# Patient Record
Sex: Female | Born: 1995 | Race: Black or African American | Hispanic: No | Marital: Single | Smoking: Current every day smoker
Health system: Southern US, Community
[De-identification: ages and names within clinical notes are randomized; demographics above are authoritative.]

## PROBLEM LIST (undated history)

## (undated) DIAGNOSIS — R51 Headache: Secondary | ICD-10-CM

## (undated) DIAGNOSIS — R519 Headache, unspecified: Secondary | ICD-10-CM

## (undated) DIAGNOSIS — R011 Cardiac murmur, unspecified: Secondary | ICD-10-CM

## (undated) DIAGNOSIS — J189 Pneumonia, unspecified organism: Secondary | ICD-10-CM

## (undated) DIAGNOSIS — R7303 Prediabetes: Secondary | ICD-10-CM

## (undated) DIAGNOSIS — F419 Anxiety disorder, unspecified: Secondary | ICD-10-CM

## (undated) HISTORY — DX: Anxiety disorder, unspecified: F41.9

---
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
## 2016-01-20 ENCOUNTER — Encounter: Payer: Self-pay | Admitting: Emergency Medicine

## 2016-01-20 DIAGNOSIS — M419 Scoliosis, unspecified: Secondary | ICD-10-CM | POA: Diagnosis not present

## 2016-01-20 DIAGNOSIS — F17210 Nicotine dependence, cigarettes, uncomplicated: Secondary | ICD-10-CM | POA: Insufficient documentation

## 2016-01-20 DIAGNOSIS — R51 Headache: Secondary | ICD-10-CM | POA: Diagnosis not present

## 2016-01-20 DIAGNOSIS — K611 Rectal abscess: Secondary | ICD-10-CM | POA: Diagnosis present

## 2016-01-20 NOTE — ED Triage Notes (Signed)
Patient ambulatory to triage with steady gait, without difficulty or distress noted; pt reports no BM in 3 days, unrelieved by OTC meds & miralax; c/o nausea and generalized abd pain

## 2016-01-21 ENCOUNTER — Emergency Department: Payer: Medicaid Other

## 2016-01-21 ENCOUNTER — Observation Stay: Payer: Medicaid Other | Admitting: Anesthesiology

## 2016-01-21 ENCOUNTER — Observation Stay
Admission: EM | Admit: 2016-01-21 | Discharge: 2016-01-22 | Disposition: A | Discharge status: Home / Self Care | Payer: Medicaid Other | Attending: Surgery | Admitting: Surgery

## 2016-01-21 ENCOUNTER — Encounter: Admission: EM | Disposition: A | Discharge status: Home / Self Care | Payer: Self-pay | Source: Home / Self Care

## 2016-01-21 DIAGNOSIS — K611 Rectal abscess: Secondary | ICD-10-CM | POA: Diagnosis present

## 2016-01-21 HISTORY — PX: INCISION AND DRAINAGE PERIRECTAL ABSCESS: SHX1804

## 2016-01-21 LAB — URINALYSIS COMPLETE WITH MICROSCOPIC (ARMC ONLY)
Bilirubin Urine: NEGATIVE
GLUCOSE, UA: NEGATIVE mg/dL
HGB URINE DIPSTICK: NEGATIVE
Leukocytes, UA: NEGATIVE
NITRITE: NEGATIVE
Protein, ur: 30 mg/dL — AB
Specific Gravity, Urine: 1.03 (ref 1.005–1.030)
pH: 6 (ref 5.0–8.0)

## 2016-01-21 LAB — COMPREHENSIVE METABOLIC PANEL
ALBUMIN: 4 g/dL (ref 3.5–5.0)
ALT: 15 U/L (ref 14–54)
ANION GAP: 7 (ref 5–15)
AST: 20 U/L (ref 15–41)
Alkaline Phosphatase: 68 U/L (ref 38–126)
BUN: 10 mg/dL (ref 6–20)
CHLORIDE: 106 mmol/L (ref 101–111)
CO2: 23 mmol/L (ref 22–32)
Calcium: 9 mg/dL (ref 8.9–10.3)
Creatinine, Ser: 0.87 mg/dL (ref 0.44–1.00)
GFR calc Af Amer: >60 mL/min (ref >60)
GFR calc non Af Amer: >60 mL/min (ref >60)
GLUCOSE: 99 mg/dL (ref 65–99)
POTASSIUM: 3.6 mmol/L (ref 3.5–5.1)
SODIUM: 136 mmol/L (ref 135–145)
TOTAL PROTEIN: 7.9 g/dL (ref 6.5–8.1)
Total Bilirubin: 0.7 mg/dL (ref 0.3–1.2)

## 2016-01-21 LAB — SURGICAL PCR SCREEN
MRSA, PCR: NEGATIVE
Staphylococcus aureus: NEGATIVE

## 2016-01-21 LAB — CBC
HEMATOCRIT: 41.1 % (ref 35.0–47.0)
HEMOGLOBIN: 14.3 g/dL (ref 12.0–16.0)
MCH: 31 pg (ref 26.0–34.0)
MCHC: 34.9 g/dL (ref 32.0–36.0)
MCV: 89 fL (ref 80.0–100.0)
Platelets: 246 10*3/uL (ref 150–440)
RBC: 4.62 MIL/uL (ref 3.80–5.20)
RDW: 13.1 % (ref 11.5–14.5)
WBC: 14.5 10*3/uL — ABNORMAL HIGH (ref 3.6–11.0)

## 2016-01-21 LAB — LIPASE, BLOOD: Lipase: 17 U/L (ref 11–51)

## 2016-01-21 LAB — POC URINE PREG, ED: PREG TEST UR: NEGATIVE

## 2016-01-21 LAB — POCT PREGNANCY, URINE: PREG TEST UR: NEGATIVE

## 2016-01-21 SURGERY — INCISION AND DRAINAGE, ABSCESS, PERIRECTAL
Anesthesia: General

## 2016-01-21 MED ORDER — MORPHINE SULFATE (PF) 2 MG/ML IV SOLN
2.0000 mg | Freq: Once | INTRAVENOUS | Status: AC
  Administered 2016-01-21: 2 mg via INTRAVENOUS

## 2016-01-21 MED ORDER — PIPERACILLIN-TAZOBACTAM 3.375 G IVPB
3.3750 g | Freq: Once | INTRAVENOUS | Status: AC
  Administered 2016-01-21: 3.375 g via INTRAVENOUS
  Filled 2016-01-21: qty 50

## 2016-01-21 MED ORDER — HYDROCODONE-ACETAMINOPHEN 5-325 MG PO TABS
1.0000 | ORAL_TABLET | ORAL | Status: DC | PRN
  Administered 2016-01-22: 1 via ORAL
  Filled 2016-01-21: qty 1

## 2016-01-21 MED ORDER — AMOXICILLIN-POT CLAVULANATE 875-125 MG PO TABS: 1.0000 | ORAL_TABLET | Freq: Two times a day (BID) | ORAL | 1 refills | Status: DC

## 2016-01-21 MED ORDER — ONDANSETRON HCL 4 MG/2ML IJ SOLN
INTRAMUSCULAR | Status: DC | PRN
Start: 2016-01-21 — End: 2016-01-21
  Administered 2016-01-21: 4 mg via INTRAVENOUS

## 2016-01-21 MED ORDER — IOPAMIDOL (ISOVUE-300) INJECTION 61%
100.0000 mL | Freq: Once | INTRAVENOUS | Status: AC | PRN
  Administered 2016-01-21: 100 mL via INTRAVENOUS

## 2016-01-21 MED ORDER — ONDANSETRON HCL 4 MG/2ML IJ SOLN: 4.0000 mg | Freq: Once | INTRAMUSCULAR | Status: DC | PRN

## 2016-01-21 MED ORDER — PIPERACILLIN-TAZOBACTAM 3.375 G IVPB
3.3750 g | Freq: Three times a day (TID) | INTRAVENOUS | Status: DC
  Administered 2016-01-21 – 2016-01-22 (×2): 3.375 g via INTRAVENOUS
  Filled 2016-01-21 (×5): qty 50

## 2016-01-21 MED ORDER — DIATRIZOATE MEGLUMINE & SODIUM 66-10 % PO SOLN
15.0000 mL | Freq: Once | ORAL | Status: AC
  Administered 2016-01-21: 15 mL via ORAL

## 2016-01-21 MED ORDER — FENTANYL CITRATE (PF) 100 MCG/2ML IJ SOLN
25.0000 ug | INTRAMUSCULAR | Status: DC | PRN
  Administered 2016-01-21 (×4): 25 ug via INTRAVENOUS

## 2016-01-21 MED ORDER — ACETAMINOPHEN 325 MG PO TABS
650.0000 mg | ORAL_TABLET | Freq: Once | ORAL | Status: AC
  Administered 2016-01-21: 650 mg via ORAL
  Filled 2016-01-21: qty 2

## 2016-01-21 MED ORDER — MORPHINE SULFATE (PF) 2 MG/ML IV SOLN
2.0000 mg | INTRAVENOUS | Status: DC | PRN
Start: 2016-01-21 — End: 2016-01-22
  Administered 2016-01-21 (×5): 2 mg via INTRAVENOUS
  Filled 2016-01-21 (×5): qty 1

## 2016-01-21 MED ORDER — PROPOFOL 10 MG/ML IV BOLUS
INTRAVENOUS | Status: DC | PRN
  Administered 2016-01-21: 180 mg via INTRAVENOUS

## 2016-01-21 MED ORDER — PIPERACILLIN-TAZOBACTAM 3.375 G IVPB 30 MIN: 3.3750 g | Freq: Three times a day (TID) | INTRAVENOUS | Status: DC

## 2016-01-21 MED ORDER — FENTANYL CITRATE (PF) 100 MCG/2ML IJ SOLN
INTRAMUSCULAR | Status: DC | PRN
  Administered 2016-01-21 (×2): 25 ug via INTRAVENOUS
  Administered 2016-01-21 (×3): 50 ug via INTRAVENOUS

## 2016-01-21 MED ORDER — GLYCOPYRROLATE 0.2 MG/ML IJ SOLN
INTRAMUSCULAR | Status: DC | PRN
  Administered 2016-01-21: 0.2 mg via INTRAVENOUS

## 2016-01-21 MED ORDER — ONDANSETRON HCL 4 MG/2ML IJ SOLN
INTRAMUSCULAR | Status: AC
  Administered 2016-01-21: 4 mg via INTRAVENOUS
  Filled 2016-01-21: qty 2

## 2016-01-21 MED ORDER — HYDROCODONE-ACETAMINOPHEN 5-300 MG PO TABS: 1.0000 | ORAL_TABLET | ORAL | 0 refills | Status: DC | PRN

## 2016-01-21 MED ORDER — MORPHINE SULFATE (PF) 2 MG/ML IV SOLN
INTRAVENOUS | Status: AC
  Administered 2016-01-21: 2 mg via INTRAVENOUS
  Filled 2016-01-21: qty 1

## 2016-01-21 MED ORDER — MIDAZOLAM HCL 2 MG/2ML IJ SOLN
INTRAMUSCULAR | Status: DC | PRN
  Administered 2016-01-21: 2 mg via INTRAVENOUS

## 2016-01-21 MED ORDER — FENTANYL CITRATE (PF) 100 MCG/2ML IJ SOLN
INTRAMUSCULAR | Status: AC
  Filled 2016-01-21: qty 2

## 2016-01-21 MED ORDER — DEXTROSE-NACL 5-0.9 % IV SOLN
INTRAVENOUS | Status: DC
  Administered 2016-01-21 – 2016-01-22 (×3): via INTRAVENOUS

## 2016-01-21 MED ORDER — ONDANSETRON HCL 4 MG/2ML IJ SOLN
4.0000 mg | Freq: Once | INTRAMUSCULAR | Status: AC
  Administered 2016-01-21: 4 mg via INTRAVENOUS

## 2016-01-21 MED ORDER — LACTATED RINGERS IV SOLN
INTRAVENOUS | Status: DC | PRN
  Administered 2016-01-21: 21:00:00 via INTRAVENOUS

## 2016-01-21 MED ORDER — DEXAMETHASONE SODIUM PHOSPHATE 10 MG/ML IJ SOLN
INTRAMUSCULAR | Status: DC | PRN
  Administered 2016-01-21: 8 mg via INTRAVENOUS

## 2016-01-21 MED ORDER — LIDOCAINE HCL (CARDIAC) 20 MG/ML IV SOLN
INTRAVENOUS | Status: DC | PRN
  Administered 2016-01-21: 60 mg via INTRAVENOUS

## 2016-01-21 SURGICAL SUPPLY — 22 items
BLADE CLIPPER SURG (BLADE) ×3 IMPLANT
BLADE SURG 15 STRL LF DISP TIS (BLADE) ×1 IMPLANT
BLADE SURG 15 STRL SS (BLADE) ×2
CANISTER SUCT 1200ML W/VALVE (MISCELLANEOUS) ×3 IMPLANT
DRAPE LEGGINS SURG 28X43 STRL (DRAPES) ×3 IMPLANT
DRAPE UNDER BUTTOCK W/FLU (DRAPES) ×3 IMPLANT
ELECT REM PT RETURN 9FT ADLT (ELECTROSURGICAL)
ELECTRODE REM PT RTRN 9FT ADLT (ELECTROSURGICAL) IMPLANT
GLOVE BIO SURGEON STRL SZ7 (GLOVE) ×9 IMPLANT
GOWN STRL REUS W/ TWL LRG LVL3 (GOWN DISPOSABLE) ×2 IMPLANT
GOWN STRL REUS W/TWL LRG LVL3 (GOWN DISPOSABLE) ×4
JELLY LUB 2OZ STRL (MISCELLANEOUS) ×2
JELLY LUBE 2OZ STRL (MISCELLANEOUS) ×1 IMPLANT
NEEDLE HYPO 22GX1.5 SAFETY (NEEDLE) ×3 IMPLANT
NS IRRIG 1000ML POUR BTL (IV SOLUTION) ×3 IMPLANT
PACK BASIN MINOR ARMC (MISCELLANEOUS) ×3 IMPLANT
PAD PREP 24X41 OB/GYN DISP (PERSONAL CARE ITEMS) ×3 IMPLANT
SCRUB POVIDONE IODINE 4 OZ (MISCELLANEOUS) IMPLANT
SOL PREP PVP 2OZ (MISCELLANEOUS) ×3
SOLUTION PREP PVP 2OZ (MISCELLANEOUS) ×1 IMPLANT
SWAB CULTURE AMIES ANAERIB BLU (MISCELLANEOUS) ×3 IMPLANT
SYR 20CC LL (SYRINGE) ×3 IMPLANT

## 2016-01-21 NOTE — Discharge Instructions (Signed)
Remove dressing in 24 hours. May shower in 24 hours.  Resume all home medications. Take new medications as prescribed Follow-up with Dr. Excell Seltzerooper in 5 days Routine drain care

## 2016-01-21 NOTE — Op Note (Signed)
01/21/2016  9:45 PM  PATIENT:  Carrie [name]  20 y.o. female  PRE-OPERATIVE DIAGNOSIS: Rectal abscess  POST-OPERATIVE DIAGNOSIS:  Same  PROCEDURE: Examination under anesthesia, anoscopy, I&D of perirectal abscess  SURGEON:  Lattie Hawichard E Brianne Maina MD, FACS   ANESTHESIA:  Gen. with endotracheal tube  This a patient with signs of a perirectal abscess requiring incision and drainage preoperatively discussed and reviewed the rationale for surgery the options of observation and the risk of bleeding infection recurrence this is all reviewed for her in the preop holding area she understood and agreed to proceed  Small perirectal abscess in the posterior midline normal anoscopy   Details of Procedure: Patient was placed into general anesthesia and then into high lithotomy position area and a surgical pause was performed and then examination under anesthesia was performed there appeared to be some induration posteriorly but no overt skin changes nor erythema nor drainage. A clamshell anoscope was placed and anoscopy was performed and while there seemed to be some fullness posteriorly there was no drainage and no obvious palpable abscess.  As and prepped draped sterile fashion. An 18-gauge needle with a 10 cc syringe was placed in the area of previous probable CT findings of an abscess or ailments was noted on aspiration and immediately therefore an incision was made and purulence was drained and cultured loculations were broken up with an instrument and then suction was performed to remove all the purulence. A quarter-inch Penrose drain was placed into the small cavity and this was held in with 3-0 nylon.  Patient out of the procedure well the workup occasions a sterile dressing was placed she was taken to recovery room in stable condition to be admitted for continued IV antibiotics.   Lattie Hawichard E Glenette Bookwalter, MD FACS

## 2016-01-21 NOTE — Progress Notes (Signed)
Preoperative Review   Patient is met in the preoperative holding area. The history is reviewed in the chart and with the patient. Patient was seen personally initially in the emergency room and scheduled for today but due to operative room scheduling constraints the patient is to have surgery now rather than earlier today. Her history is thoroughly reviewed. I personally reviewed the options and rationale as well as the risks of this procedure that have been previously discussed with the patient. All questions asked by the patient. Questions were answered to her satisfaction. No family was present.  Patient agrees to proceed with this procedure at this time.  Florene Glen M.D. FACS

## 2016-01-21 NOTE — Anesthesia Preprocedure Evaluation (Signed)
Anesthesia Evaluation  Patient identified by MRN, date of birth, ID band Patient awake    Reviewed: Allergy & Precautions, NPO status , Patient's Chart, lab work & pertinent test results, reviewed documented beta blocker date and time   Airway Mallampati: II  TM Distance: >3 FB     Dental  (+) Chipped   Pulmonary Current Smoker,           Cardiovascular      Neuro/Psych  Headaches,    GI/Hepatic   Endo/Other    Renal/GU      Musculoskeletal   Abdominal   Peds  Hematology   Anesthesia Other Findings   Reproductive/Obstetrics                             Anesthesia Physical Anesthesia Plan  ASA: II  Anesthesia Plan: General   Post-op Pain Management:    Induction: Intravenous  Airway Management Planned: LMA  Additional Equipment:   Intra-op Plan:   Post-operative Plan:   Informed Consent: I have reviewed the patients History and Physical, chart, labs and discussed the procedure including the risks, benefits and alternatives for the proposed anesthesia with the patient or authorized representative who has indicated his/her understanding and acceptance.     Plan Discussed with: CRNA  Anesthesia Plan Comments:         Anesthesia Quick Evaluation

## 2016-01-21 NOTE — Progress Notes (Signed)
Preoperative Informed Consent Review   I have discussed the risks, benefits, and options for this particular procedure with Carrie [name] and family.  She understands the risk for this operation and the alternatives for intervention.  She is in agreement.  She has had her questions answered to her stated satisfaction.  Patient agrees to proceed with this procedure at this time.  [name] Joya Willmott MD FACS

## 2016-01-21 NOTE — H&P (Signed)
Carrie [name] is an 20 y.o. female.    Chief Complaint: Perianal pain  HPI: This patient with 4 days of worsening pain in her perianal area she's never had an episode like this before denies fevers or chills has had normal bowel movements points the area on the posterior midline as a side of her pain. She has no nausea or vomiting  Patient is employed she smokes tobacco does not drink alcohol  Family history noncontributory  Past Medical History:  Diagnosis Date  . Headache     History reviewed. No pertinent surgical history.  No family history on file. Social History:  reports that she has been smoking Cigarettes.  She has never used smokeless tobacco. She reports that she does not drink alcohol or use drugs.  Allergies: No Known Allergies  No prescriptions prior to admission.     Review of Systems  Constitutional: Negative.   HENT: Negative.   Eyes: Negative.   Respiratory: Negative.   Cardiovascular: Negative.   Gastrointestinal: Negative.   Genitourinary: Negative.   Musculoskeletal: Negative.   Skin: Negative.   Neurological: Negative.   Endo/Heme/Allergies: Negative.   Psychiatric/Behavioral: Negative.      Physical Exam:  BP 116/62 (BP Location: Right Arm)   Pulse 63   Temp 98.2 F (36.8 C) (Oral)   Resp 18   Ht '5\' 2"'  (1.575 m)   Wt 136 lb (61.7 kg)   LMP 01/13/2016 (Exact Date) Comment: neg preg test  SpO2 100%   BMI 24.87 kg/m   Physical Exam  Constitutional: She is oriented to person, place, and time and well-developed, well-nourished, and in no distress. No distress.  HENT:  Head: Normocephalic and atraumatic.  Eyes: Right eye exhibits no discharge. Left eye exhibits no discharge. No scleral icterus.  Neck: Normal range of motion.  Cardiovascular: Normal rate, regular rhythm and normal heart sounds.   Pulmonary/Chest: Effort normal and breath sounds normal. No respiratory distress. She has no wheezes. She has no rales.  Abdominal: Soft.  She exhibits no distension. There is no tenderness.  Genitourinary:  Genitourinary Comments: Posterior perianal area tender swollen  Musculoskeletal: Normal range of motion. She exhibits no edema or tenderness.  Lymphadenopathy:    She has no cervical adenopathy.  Neurological: She is alert and oriented to person, place, and time.  Skin: Skin is warm and dry. She is not diaphoretic. No erythema.  Psychiatric: Mood and affect normal.  Vitals reviewed.       Results for orders placed or performed during the hospital encounter of 01/21/16 (from the past 48 hour(s))  Lipase, blood     Status: None   Collection Time: 01/20/16 11:45 PM  Result Value Ref Range   Lipase 17 11 - 51 U/L  Comprehensive metabolic panel     Status: None   Collection Time: 01/20/16 11:45 PM  Result Value Ref Range   Sodium 136 135 - 145 mmol/L   Potassium 3.6 3.5 - 5.1 mmol/L   Chloride 106 101 - 111 mmol/L   CO2 23 22 - 32 mmol/L   Glucose, Bld 99 65 - 99 mg/dL   BUN 10 6 - 20 mg/dL   Creatinine, Ser 0.87 0.44 - 1.00 mg/dL   Calcium 9.0 8.9 - 10.3 mg/dL   Total Protein 7.9 6.5 - 8.1 g/dL   Albumin 4.0 3.5 - 5.0 g/dL   AST 20 15 - 41 U/L   ALT 15 14 - 54 U/L   Alkaline Phosphatase 68 38 -  126 U/L   Total Bilirubin 0.7 0.3 - 1.2 mg/dL   GFR calc non Af Amer >60 >60 mL/min   GFR calc Af Amer >60 >60 mL/min    Comment: (NOTE) The eGFR has been calculated using the CKD EPI equation. This calculation has not been validated in all clinical situations. eGFR's persistently <60 mL/min signify possible Chronic Kidney Disease.    Anion gap 7 5 - 15  CBC     Status: Abnormal   Collection Time: 01/20/16 11:45 PM  Result Value Ref Range   WBC 14.5 (H) 3.6 - 11.0 K/uL   RBC 4.62 3.80 - 5.20 MIL/uL   Hemoglobin 14.3 12.0 - 16.0 g/dL   HCT 41.1 35.0 - 47.0 %   MCV 89.0 80.0 - 100.0 fL   MCH 31.0 26.0 - 34.0 pg   MCHC 34.9 32.0 - 36.0 g/dL   RDW 13.1 11.5 - 14.5 %   Platelets 246 150 - 440 K/uL  Urinalysis  complete, with microscopic     Status: Abnormal   Collection Time: 01/20/16 11:45 PM  Result Value Ref Range   Color, Urine YELLOW (A) YELLOW   APPearance CLEAR (A) CLEAR   Glucose, UA NEGATIVE NEGATIVE mg/dL   Bilirubin Urine NEGATIVE NEGATIVE   Ketones, ur 1+ (A) NEGATIVE mg/dL   Specific Gravity, Urine 1.030 1.005 - 1.030   Hgb urine dipstick NEGATIVE NEGATIVE   pH 6.0 5.0 - 8.0   Protein, ur 30 (A) NEGATIVE mg/dL   Nitrite NEGATIVE NEGATIVE   Leukocytes, UA NEGATIVE NEGATIVE   RBC / HPF 0-5 0 - 5 RBC/hpf   WBC, UA 0-5 0 - 5 WBC/hpf   Bacteria, UA RARE (A) NONE SEEN   Squamous Epithelial / LPF 0-5 (A) NONE SEEN  POC urine preg, ED     Status: None   Collection Time: 01/20/16 11:51 PM  Result Value Ref Range   Preg Test, Ur Negative Negative   Dg Abdomen 1 View  Result Date: 01/21/2016 CLINICAL DATA:  Constipation EXAM: ABDOMEN - 1 VIEW COMPARISON:  None. FINDINGS: Normal bowel gas pattern. No abnormal stool retention or impaction. No concerning mass effect or calcification. Lumbar levo curvature could be positional. No focal osseous finding. IMPRESSION: Normal bowel gas pattern.  No abnormal stool retention. Electronically Signed   By: Monte Fantasia M.D.   On: 01/21/2016 00:56   Ct Abdomen Pelvis W Contrast  Result Date: 01/21/2016 CLINICAL DATA:  Generalized abdominal pain, worse in the right lower quadrant. Nausea. EXAM: CT ABDOMEN AND PELVIS WITH CONTRAST TECHNIQUE: Multidetector CT imaging of the abdomen and pelvis was performed using the standard protocol following bolus administration of intravenous contrast. CONTRAST:  127m ISOVUE-300 IOPAMIDOL (ISOVUE-300) INJECTION 61% COMPARISON:  Radiograph earlier this day. FINDINGS: Lower chest:  The included lung bases are clear. Liver: Normal. Hepatobiliary: Gallbladder physiologically distended, no calcified stone. No biliary dilatation. Pancreas: No ductal dilatation or inflammation. Spleen: Normal. Adrenal glands: No nodule.  Kidneys: Symmetric renal enhancement. No hydronephrosis. No perinephric edema. Stomach/Bowel: Stomach physiologically distended. There are no dilated or thickened small bowel loops. No terminal ileal inflammation. Small to moderate volume of stool throughout the colon without colonic wall thickening. The appendix is normal. There is a midline perirectal fluid collection extending right and left lateral measuring 2.6 x 2.9 x 2.8 cm. Surrounding soft tissue induration with peripheral enhancement and thick wall. Vascular/Lymphatic: No retroperitoneal, mesenteric, or pelvic adenopathy. Abdominal aorta is normal in caliber. Reproductive: Uterus is normal. Ovaries symmetric in size.  No adnexal mass. Bladder: Minimally distended, no wall thickening. Other: No free air free fluid. Musculoskeletal: There are no acute or suspicious osseous abnormalities. Mild broad-based levo scoliosis. IMPRESSION: 1. Midline perirectal fluid collection measuring 2.9 cm most consistent with abscess. 2. No additional acute abnormality.  Normal appendix. Electronically Signed   By: Jeb Levering M.D.   On: 01/21/2016 04:40     Assessment/Plan  This patient with a small posterior midline perirectal abscess that requires I&D she'll be admitted the hospital IV antibiotics will be started and have been instituted in the emergency room. I discussed with her the rationale for incision and drainage the options of observation and the risk of bleeding infection recurrence and open wound she understood and agreed to proceed Dr. Dahlia Byes would be performing this procedure later today  Florene Glen, MD, FACS

## 2016-01-21 NOTE — Anesthesia Procedure Notes (Signed)
Procedure Name: LMA Insertion Performed by: Dijuan Sleeth Pre-anesthesia Checklist: Patient identified, Patient being monitored, Timeout performed, Emergency Drugs available and Suction available Patient Re-evaluated:Patient Re-evaluated prior to inductionOxygen Delivery Method: Circle system utilized Preoxygenation: Pre-oxygenation with 100% oxygen Intubation Type: IV induction Ventilation: Mask ventilation without difficulty LMA: LMA inserted LMA Size: 4.0 Tube type: Oral Number of attempts: 1 Placement Confirmation: positive ETCO2 and breath sounds checked- equal and bilateral Tube secured with: Tape Dental Injury: Teeth and Oropharynx as per pre-operative assessment        

## 2016-01-21 NOTE — Transfer of Care (Signed)
Immediate Anesthesia Transfer of Care Note  Patient: Carrie [name]  Procedure(s) Performed: Procedure(s): IRRIGATION AND DEBRIDEMENT PERIRECTAL ABSCESS (N/A)  Patient Location: PACU  Anesthesia Type:General  Level of Consciousness: sedated  Airway & Oxygen Therapy: Patient Spontanous Breathing and Patient connected to face mask oxygen  Post-op Assessment: Report given to RN and Post -op Vital signs reviewed and stable  Post vital signs: Reviewed and stable  Last Vitals:  Vitals:   01/21/16 1300 01/21/16 2123  BP: 120/64 107/65  Pulse: 67 (!) 106  Resp: 19 (!) 22  Temp: 36.6 C     Last Pain:  Vitals:   01/21/16 2013  TempSrc:   PainSc: 6       Patients Stated Pain Goal: 0 (01/21/16 0900)  Complications: No apparent anesthesia complications

## 2016-01-21 NOTE — ED Notes (Signed)
POCT pregnancy negative

## 2016-01-22 ENCOUNTER — Encounter: Payer: Self-pay | Admitting: Surgery

## 2016-01-22 NOTE — Progress Notes (Signed)
IV was removed. Discharge instructions, follow-up appointments, work letter, and prescriptions were provided to the pt. All questions were answered.

## 2016-01-22 NOTE — Discharge Summary (Signed)
  Patient ID: Irven ShellingChiquita L Radziewicz MRN: [id number] DOB/AGE: 19/11/1995 19 y.o.  Admit date: 01/21/2016 Discharge date: 01/22/2016   Discharge Diagnoses:  Active Problems:   Perirectal abscess   Procedures I&D of perirectal abscess  Hospital Course: 20 year old female admitted for severe anorectal pain consistent with perirectal abscess confirmed by CT scan. Patient was admitted and place an IV antibiotic and was promptly taken to the operating room by Dr. Excell Seltzerooper for an I&D. Did well overnight. At the time of discharge she was ambulating, her pain was controlled, her vital signs were stable and she was afebrile. Her physical exam shows a female in no acute distress awake alert. Abdomen was soft nontender no peritonitis. Extremities were were perfused with no evidence of edema. Condition of the patient at time of discharge is stable    Disposition: 01-Home or Self Care     Medication List    TAKE these medications   amoxicillin-clavulanate 875-125 MG tablet Commonly known as:  AUGMENTIN Take 1 tablet by mouth 2 (two) times daily.   Hydrocodone-Acetaminophen 5-300 MG Tabs Commonly known as:  VICODIN Take 1 tablet by mouth every 4 (four) hours as needed.      Follow-up Information    Dionne Miloichard Cooper, MD Follow up in 5 day(s).   Specialty:  Surgery Contact information: [address] [place] [address] [phone]            Sterling Bigiego Pabon, MD FACS

## 2016-01-22 NOTE — Anesthesia Postprocedure Evaluation (Signed)
Anesthesia Post Note  Patient: Carrie [name]  Procedure(s) Performed: Procedure(s) (LRB): IRRIGATION AND DEBRIDEMENT PERIRECTAL ABSCESS (N/A)  Patient location during evaluation: PACU Anesthesia Type: General Level of consciousness: awake and alert Pain management: pain level controlled Vital Signs Assessment: post-procedure vital signs reviewed and stable Respiratory status: spontaneous breathing, nonlabored ventilation, respiratory function stable and patient connected to nasal cannula oxygen Cardiovascular status: blood pressure returned to baseline and stable Postop Assessment: no signs of nausea or vomiting Anesthetic complications: no    Last Vitals:  Vitals:   01/22/16 0033 01/22/16 0453  BP: 121/87 123/79  Pulse: 88 64  Resp: 20 20  Temp: 36.7 C 36.7 C    Last Pain:  Vitals:   01/22/16 0453  TempSrc: Oral  PainSc:                  [name]

## 2016-01-24 LAB — AEROBIC CULTURE  (SUPERFICIAL SPECIMEN): CULTURE: NO GROWTH

## 2016-01-24 LAB — AEROBIC CULTURE W GRAM STAIN (SUPERFICIAL SPECIMEN)

## 2016-01-26 ENCOUNTER — Ambulatory Visit (INDEPENDENT_AMBULATORY_CARE_PROVIDER_SITE_OTHER): Payer: Medicaid Other | Admitting: Surgery

## 2016-01-26 ENCOUNTER — Encounter: Payer: Self-pay | Admitting: Surgery

## 2016-01-26 ENCOUNTER — Other Ambulatory Visit: Payer: Self-pay

## 2016-01-26 VITALS — BP 125/72 | HR 87 | Temp 98.4°F | Ht 62.0 in | Wt 136.0 lb

## 2016-01-26 DIAGNOSIS — K611 Rectal abscess: Secondary | ICD-10-CM

## 2016-01-26 NOTE — Progress Notes (Signed)
Outpatient postop visit  01/26/2016  Carrie ShellingChiquita L [name] is an 20 y.o. female.    Procedure: I&D of perirectal abscess  CC: No pain and minimal drainage  HPI: Patient feels much better than she did before she still has Penrose drain in place no fevers or chills  Medications reviewed.    Physical Exam:  LMP 01/13/2016 (Exact Date) Comment: neg preg test    PE: Minimal purulent drainage Penrose drain is removed no erythema no tenderness    Assessment/Plan:  Cultures reviewed patient is on antibiotics.  Patient doing better today Penrose drain is removed she'll follow-up in 2 weeks  Lattie Hawichard E Nyanna Heideman, MD, FACS

## 2016-01-26 NOTE — Patient Instructions (Signed)
Please call our office if you have any questions or concerns.  

## 2016-01-27 LAB — ANAEROBIC CULTURE

## 2016-02-21 ENCOUNTER — Encounter: Payer: Self-pay | Admitting: Emergency Medicine

## 2016-02-21 ENCOUNTER — Emergency Department
Admission: EM | Admit: 2016-02-21 | Discharge: 2016-02-21 | Disposition: A | Discharge status: Home / Self Care | Payer: Medicaid Other | Attending: Emergency Medicine | Admitting: Emergency Medicine

## 2016-02-21 DIAGNOSIS — Z791 Long term (current) use of non-steroidal anti-inflammatories (NSAID): Secondary | ICD-10-CM | POA: Insufficient documentation

## 2016-02-21 DIAGNOSIS — J309 Allergic rhinitis, unspecified: Secondary | ICD-10-CM | POA: Insufficient documentation

## 2016-02-21 DIAGNOSIS — F17210 Nicotine dependence, cigarettes, uncomplicated: Secondary | ICD-10-CM | POA: Diagnosis not present

## 2016-02-21 DIAGNOSIS — J029 Acute pharyngitis, unspecified: Secondary | ICD-10-CM | POA: Diagnosis present

## 2016-02-21 LAB — POCT RAPID STREP A: STREPTOCOCCUS, GROUP A SCREEN (DIRECT): NEGATIVE

## 2016-02-21 MED ORDER — LORATADINE 10 MG PO TABS: 10.0000 mg | ORAL_TABLET | Freq: Every day | ORAL | 0 refills | Status: DC

## 2016-02-21 MED ORDER — FLUTICASONE PROPIONATE 50 MCG/ACT NA SUSP
2.0000 | Freq: Every day | NASAL | 0 refills | Status: DC
Start: 2016-02-21 — End: 2019-03-28

## 2016-02-21 NOTE — ED Triage Notes (Signed)
Pt c/o sore throat for the past 3 days. Pain when swallowing. Pt states she also has been getting chills frequently as well.  No changes in voice noticed at this time.

## 2016-02-21 NOTE — ED Provider Notes (Signed)
[facility] Emergency Department Provider Note  ____________________________________________  Time seen: Approximately 10:48 AM  I have reviewed the triage vital signs and the nursing notes.   HISTORY  Chief Complaint Sore Throat    HPI Carrie [name] is a 20 y.o. female, NAD, presents to the emergency department with three-day history of sore throat, nasal congestion. Patient states her throat feels swollen and painful and is worse when she tries to eat. He notices that she swallowing a lot of drainage. Has had no difficulty swallowing or breathing. Has had no swelling about the lips/tongue/throat. Also experiencing clear nasal drainage and clear eye drainage with itchy irritation. Denies fevers, fatigue, chills, cough, chest congestion, chest pain, shortness of breath, wheezing, abdominal pain, nausea or vomiting. Patient has been taking theraflu and halls throat lozenges without relief in symptoms. Patient states her sister had a sore throat a few days ago, but it has resolved. No other sick contacts.    Past Medical History:  Diagnosis Date  . Headache     Patient Active Problem List   Diagnosis Date Noted  . Perirectal abscess     Past Surgical History:  Procedure Laterality Date  . INCISION AND DRAINAGE PERIRECTAL ABSCESS N/A 01/21/2016   Procedure: IRRIGATION AND DEBRIDEMENT PERIRECTAL ABSCESS;  Surgeon: Leafy Ro, MD;  Location: [facility] ORS;  Service: General;  Laterality: N/A;    Prior to Admission medications   Medication Sig Start Date End Date Taking? Authorizing Provider  amoxicillin-clavulanate (AUGMENTIN) 875-125 MG tablet Take 1 tablet by mouth 2 (two) times daily. 01/21/16   Lattie Haw, MD  fluticasone (FLONASE) 50 MCG/ACT nasal spray Place 2 sprays into both nostrils daily. 02/21/16   Jami L Hagler, PA-C  Hydrocodone-Acetaminophen (VICODIN) 5-300 MG TABS Take 1 tablet by mouth every 4 (four) hours as needed. 01/21/16   Lattie Haw, MD  loratadine (CLARITIN) 10 MG tablet Take 1 tablet (10 mg total) by mouth daily. 02/21/16   Jami L Hagler, PA-C  polyethylene glycol powder (GLYCOLAX/MIRALAX) powder MIX 2 CAPFULS IN 8 OUNCES OF WATER AND DRINK ON DAY 1 AND DAY2 THEN TAKE AS NEEDED 01/19/16   Historical Provider, MD    Allergies Review of patient's allergies indicates no known allergies.  Family History  Problem Relation Age of Onset  . Hypertension Mother   . Cancer Maternal Aunt   . Heart disease Paternal Grandmother     Social History Social History  Substance Use Topics  . Smoking status: Current Every Day Smoker    Packs/day: 0.25    Types: Cigarettes  . Smokeless tobacco: Never Used  . Alcohol use No     Review of Systems  Constitutional: No fever/chills Eyes: No visual changes. Clear discharge bilaterally with itchy irritation.  ENT: Positive sore throat, nasal congestion, runny nose, postnasal drip. No ear pain, sinus pressure. Cardiovascular: No chest pain. Respiratory: No cough. No shortness of breath. No wheezing.  Gastrointestinal: No abdominal pain.  No nausea, vomiting.   Musculoskeletal:  Negative for general myalgias Skin: Negative for rash. Neurological: Negative for headaches.   ____________________________________________   PHYSICAL EXAM:  VITAL SIGNS: ED Triage Vitals [02/21/16 1043]  Enc Vitals Group     BP 131/76     Pulse Rate 84     Resp 18     Temp 98.5 F (36.9 C)     Temp Source Oral     SpO2 100 %     Weight 136 lb (61.7 kg)  Height 5\' 2"  (1.575 m)     Head Circumference      Peak Flow      Pain Score      Pain Loc      Pain Edu?      Excl. in GC?     Constitutional: Alert and oriented. Well appearing and in no acute distress. Eyes: Conjunctivae are normal bilaterally, no drainage. PERRLA. EOMI without pain Head: Atraumatic. ENT:      Ears: TMs visualized bilaterally without erythema, bulging, effusion, perforation.      Nose: Moderate  congestion with moderate clear rhinorrhea. Bilateral turbinates with injection and mild edema.      Mouth/Throat: Mucous membranes are moist. Pharynx with mild injection and clear postnasal drainage but no swelling or exudate.  Neck: No stridor. Supple with full ROM. Hematological/Lymphatic/Immunilogical: No cervical lymphadenopathy. Cardiovascular: Normal rate, regular rhythm. Normal S1 and S2.  Good peripheral circulation. Respiratory: Normal respiratory effort without tachypnea or retractions. Lungs CTAB with breath sounds noted in all lung fields. No wheeze, rhonchi, rales. Musculoskeletal: Full ROM in all extremities without pain or difficulty. Neurologic:  Normal speech and language. No gross focal neurologic deficits are appreciated.  Skin:  Skin is warm, dry and intact. No rash noted. Psychiatric: Mood and affect are normal. Speech and behavior are normal. Patient exhibits appropriate insight and judgement.   ____________________________________________   LABS (all labs ordered are listed, but only abnormal results are displayed)  Labs Reviewed  POCT RAPID STREP A   ____________________________________________  EKG  None ____________________________________________  RADIOLOGY  None ____________________________________________    PROCEDURES  Procedure(s) performed: None   Procedures   Medications - No data to display   ____________________________________________   INITIAL IMPRESSION / ASSESSMENT AND PLAN / ED COURSE  Pertinent labs & imaging results that were available during my care of the patient were reviewed by me and considered in my medical decision making (see chart for details).  Clinical Course    Patient's diagnosis is consistent with Allergic rhinitis. Patient will be discharged home with prescriptions for Flonase and loratadine to take as directed. Patient is to follow up with her primary care provider if symptoms persist past this treatment  course. Patient is given ED precautions to return to the ED for any worsening or new symptoms.    ____________________________________________  FINAL CLINICAL IMPRESSION(S) / ED DIAGNOSES  Final diagnoses:  Allergic rhinitis, unspecified allergic rhinitis type      NEW MEDICATIONS STARTED DURING THIS VISIT:  New Prescriptions   FLUTICASONE (FLONASE) 50 MCG/ACT NASAL SPRAY    Place 2 sprays into both nostrils daily.   LORATADINE (CLARITIN) 10 MG TABLET    Take 1 tablet (10 mg total) by mouth daily.        Hope PigeonJami L Hagler, PA-C 02/21/16 1128    Governor Rooksebecca Lord, MD 02/21/16 1217

## 2017-05-14 ENCOUNTER — Emergency Department
Admission: EM | Admit: 2017-05-14 | Discharge: 2017-05-14 | Disposition: A | Discharge status: Home / Self Care | Payer: Self-pay | Attending: Emergency Medicine | Admitting: Emergency Medicine

## 2017-05-14 ENCOUNTER — Encounter: Payer: Self-pay | Admitting: Emergency Medicine

## 2017-05-14 ENCOUNTER — Other Ambulatory Visit: Payer: Self-pay

## 2017-05-14 DIAGNOSIS — F17210 Nicotine dependence, cigarettes, uncomplicated: Secondary | ICD-10-CM | POA: Insufficient documentation

## 2017-05-14 DIAGNOSIS — M545 Low back pain, unspecified: Secondary | ICD-10-CM

## 2017-05-14 LAB — URINALYSIS, COMPLETE (UACMP) WITH MICROSCOPIC
BILIRUBIN URINE: NEGATIVE
Bacteria, UA: NONE SEEN
Glucose, UA: NEGATIVE mg/dL
HGB URINE DIPSTICK: NEGATIVE
KETONES UR: NEGATIVE mg/dL
Leukocytes, UA: NEGATIVE
Nitrite: NEGATIVE
PH: 7 (ref 5.0–8.0)
Protein, ur: NEGATIVE mg/dL
SPECIFIC GRAVITY, URINE: 1.02 (ref 1.005–1.030)

## 2017-05-14 LAB — BASIC METABOLIC PANEL
Anion gap: 8 (ref 5–15)
BUN: 12 mg/dL (ref 6–20)
CALCIUM: 9.2 mg/dL (ref 8.9–10.3)
CHLORIDE: 106 mmol/L (ref 101–111)
CO2: 26 mmol/L (ref 22–32)
CREATININE: 0.79 mg/dL (ref 0.44–1.00)
GFR calc non Af Amer: >60 mL/min (ref >60)
Glucose, Bld: 107 mg/dL — ABNORMAL HIGH (ref 65–99)
Potassium: 3.5 mmol/L (ref 3.5–5.1)
Sodium: 140 mmol/L (ref 135–145)

## 2017-05-14 LAB — CBC
HCT: 40 % (ref 35.0–47.0)
Hemoglobin: 13.5 g/dL (ref 12.0–16.0)
MCH: 31 pg (ref 26.0–34.0)
MCHC: 33.7 g/dL (ref 32.0–36.0)
MCV: 92 fL (ref 80.0–100.0)
PLATELETS: 260 10*3/uL (ref 150–440)
RBC: 4.35 MIL/uL (ref 3.80–5.20)
RDW: 13.5 % (ref 11.5–14.5)
WBC: 6.5 10*3/uL (ref 3.6–11.0)

## 2017-05-14 LAB — POCT PREGNANCY, URINE: PREG TEST UR: NEGATIVE

## 2017-05-14 MED ORDER — NAPROXEN 375 MG PO TABS: 375.0000 mg | ORAL_TABLET | Freq: Two times a day (BID) | ORAL | 0 refills | Status: AC

## 2017-05-14 NOTE — ED Provider Notes (Signed)
[facility] Emergency Department Provider Note  ____________________________________________   I have reviewed the triage vital signs and the nursing notes. Where available I have reviewed prior notes and, if possible and indicated, outside hospital notes.    HISTORY  Chief Complaint Flank Pain and Fatigue    HPI Sharman L Gaillard is a 21 y.o. female presents today complaining of back pain.  Patient has had back pain for 5 or 6 years.  It is always in the same spot, in the upper lumbar region.  Patient does bend over a lot to work and it makes it worse.  She has had no incontinence of bowel or bladder, no fever no chills, she states sometimes she has dysuria did have one episode of dysuria this morning but no frequency no other signs of urinary tract infection does not believe she has a UTI "I am sure of it".  Patient states she has had no vaginal discharge or abdominal pain.  She has had no focal numbness or weakness, she denies any difficulty bending over, the patient states the pain is worse when she moves or changes position the wrong way, it is better when she sits still, also worse when she twists.  There is no rectal pain or pain with defecation.  The pain is sharp.  It has been there for years but worse over the last couple days.  Patient would like a work note for both her jobs.  She has taken nothing for the pain at home including nonsteroidals.    Past Medical History:  Diagnosis Date  . Headache     Patient Active Problem List   Diagnosis Date Noted  . Perirectal abscess     Past Surgical History:  Procedure Laterality Date  . INCISION AND DRAINAGE PERIRECTAL ABSCESS N/A 01/21/2016   Procedure: IRRIGATION AND DEBRIDEMENT PERIRECTAL ABSCESS;  Surgeon: Leafy Ro, MD;  Location: [facility] ORS;  Service: General;  Laterality: N/A;    Prior to Admission medications   Medication Sig Start Date End Date Taking? Authorizing Provider   amoxicillin-clavulanate (AUGMENTIN) 875-125 MG tablet Take 1 tablet by mouth 2 (two) times daily. 01/21/16   Lattie Haw, MD  fluticasone (FLONASE) 50 MCG/ACT nasal spray Place 2 sprays into both nostrils daily. 02/21/16   Hagler, Jami L, PA-C  Hydrocodone-Acetaminophen (VICODIN) 5-300 MG TABS Take 1 tablet by mouth every 4 (four) hours as needed. 01/21/16   Lattie Haw, MD  loratadine (CLARITIN) 10 MG tablet Take 1 tablet (10 mg total) by mouth daily. 02/21/16   Hagler, Jami L, PA-C  polyethylene glycol powder (GLYCOLAX/MIRALAX) powder MIX 2 CAPFULS IN 8 OUNCES OF WATER AND DRINK ON DAY 1 AND DAY2 THEN TAKE AS NEEDED 01/19/16   [provider]    Allergies Patient has no known allergies.  Family History  Problem Relation Age of Onset  . Hypertension Mother   . Cancer Maternal Aunt   . Heart disease Paternal Grandmother     Social History Social History   Tobacco Use  . Smoking status: Current Every Day Smoker    Packs/day: 0.25    Types: Cigarettes  . Smokeless tobacco: Never Used  Substance Use Topics  . Alcohol use: No  . Drug use: No    Review of Systems Constitutional: No fever/chills Eyes: No visual changes. ENT: No sore throat. No stiff neck no neck pain Cardiovascular: Denies chest pain. Respiratory: Denies shortness of breath. Gastrointestinal:   no vomiting.  No diarrhea.  No constipation. Genitourinary: Negative for dysuria. Musculoskeletal: Negative lower extremity swelling Skin: Negative for rash. Neurological: Negative for severe headaches, focal weakness or numbness.   ____________________________________________   PHYSICAL EXAM:  VITAL SIGNS: ED Triage Vitals  Enc Vitals Group     BP 05/14/17 1248 97/72     Pulse Rate 05/14/17 1248 73     Resp 05/14/17 1248 18     Temp 05/14/17 1248 98.2 F (36.8 C)     Temp Source 05/14/17 1248 Oral     SpO2 05/14/17 1248 100 %     Weight 05/14/17 1248 153 lb (69.4 kg)     Height 05/14/17  1248 5\' 2"  (1.575 m)     Head Circumference --      Peak Flow --      Pain Score 05/14/17 1251 7     Pain Loc --      Pain Edu? --      Excl. in GC? --     Constitutional: Alert and oriented. Well appearing and in no acute distress. Eyes: Conjunctivae are normal Head: Atraumatic HEENT: No congestion/rhinnorhea. Mucous membranes are moist.  Oropharynx non-erythematous Neck:   Nontender with no meningismus, no masses, no stridor Cardiovascular: Normal rate, regular rhythm. Grossly normal heart sounds.  Good peripheral circulation. Respiratory: Normal respiratory effort.  No retractions. Lungs CTAB. Abdominal: Soft and nontender. No distention. No guarding no rebound Back: There is non-midline tenderness to palpation of the paraspinal muscles but no evidence of abscess shingles erythema or infection, there is no midline tenderness there are no lesions noted. there is no CVA tenderness Musculoskeletal: No lower extremity tenderness, no upper extremity tenderness. No joint effusions, no DVT signs strong distal pulses no edema Neurologic:  Normal speech and language. No gross focal neurologic deficits are appreciated.  Skin:  Skin is warm, dry and intact. No rash noted. Psychiatric: Mood and affect are normal. Speech and behavior are normal.  ____________________________________________   LABS (all labs ordered are listed, but only abnormal results are displayed)  Labs Reviewed  URINALYSIS, COMPLETE (UACMP) WITH MICROSCOPIC - Abnormal; Notable for the following components:      Result Value   Color, Urine YELLOW (*)    APPearance HAZY (*)    Squamous Epithelial / LPF 6-30 (*)    All other components within normal limits  BASIC METABOLIC PANEL - Abnormal; Notable for the following components:   Glucose, Bld 107 (*)    All other components within normal limits  URINE CULTURE  CBC  POC URINE PREG, ED  POCT PREGNANCY, URINE    Pertinent labs  results that were available during my  care of the patient were reviewed by me and considered in my medical decision making (see chart for details). ____________________________________________  EKG  I personally interpreted any EKGs ordered by me or triage  ____________________________________________  RADIOLOGY  Pertinent labs & imaging results that were available during my care of the patient were reviewed by me and considered in my medical decision making (see chart for details). If possible, patient and/or family made aware of any abnormal findings.  No results found. ____________________________________________    PROCEDURES  Procedure(s) performed: None  Procedures  Critical Care performed: None  ____________________________________________   INITIAL IMPRESSION / ASSESSMENT AND PLAN / ED COURSE  Pertinent labs & imaging results that were available during my care of the patient were reviewed by me and considered in my medical decision making (see chart for details).  Patient here with chronic  recurrent low back pain with no radiation or radicular symptoms no evidence of cauda equina syndrome no evidence of abscess or paraspinal hematoma no evidence of referred abdominal discomfort no evidence of UTI or kidney stone disease no evidence of PID or appendicitis or other intra-abdominal pathology and patient declines pelvic exam which I think at this time is quite reasonable given her symptoms.  We will start patient on nonsteroidal pain medications extensive return precautions given and understood.  We will give her her requested no work notes    ____________________________________________   FINAL CLINICAL IMPRESSION(S) / ED DIAGNOSES  Final diagnoses:  None      This chart was dictated using voice recognition software.  Despite best efforts to proofread,  errors can occur which can change meaning.      Jeanmarie PlantMcShane, James A, MD 05/14/17 1500

## 2017-05-14 NOTE — ED Triage Notes (Signed)
Pt presents to ED via POV c/o R flank pain, dysuria, and generalized fatigue x2 weeks. C/o nausea/vomiting when waking up in the morning.

## 2017-05-14 NOTE — ED Notes (Signed)
NAD noted at time of D/C. Pt denies questions or concerns. Pt ambulatory to the lobby at this time.  

## 2017-05-15 LAB — URINE CULTURE

## 2017-07-15 ENCOUNTER — Other Ambulatory Visit: Payer: Self-pay

## 2017-07-15 ENCOUNTER — Encounter: Payer: Self-pay | Admitting: Emergency Medicine

## 2017-07-15 ENCOUNTER — Emergency Department
Admission: EM | Admit: 2017-07-15 | Discharge: 2017-07-15 | Disposition: A | Discharge status: Home / Self Care | Payer: Medicaid Other | Attending: Emergency Medicine | Admitting: Emergency Medicine

## 2017-07-15 DIAGNOSIS — Z79899 Other long term (current) drug therapy: Secondary | ICD-10-CM | POA: Insufficient documentation

## 2017-07-15 DIAGNOSIS — J101 Influenza due to other identified influenza virus with other respiratory manifestations: Secondary | ICD-10-CM | POA: Insufficient documentation

## 2017-07-15 DIAGNOSIS — F17210 Nicotine dependence, cigarettes, uncomplicated: Secondary | ICD-10-CM | POA: Insufficient documentation

## 2017-07-15 LAB — INFLUENZA PANEL BY PCR (TYPE A & B)
Influenza A By PCR: POSITIVE — AB
Influenza B By PCR: NEGATIVE

## 2017-07-15 MED ORDER — OSELTAMIVIR PHOSPHATE 75 MG PO CAPS: 75.0000 mg | ORAL_CAPSULE | Freq: Two times a day (BID) | ORAL | 0 refills | Status: DC

## 2017-07-15 NOTE — ED Notes (Signed)
Pt reports that the cough started yesterday and that she is positive for fever.  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

## 2017-07-15 NOTE — ED Provider Notes (Signed)
[facility] Emergency Department Provider Note  ____________________________________________   First MD Initiated Contact with Patient 07/15/17 1109     (approximate)  I have reviewed the triage vital signs and the nursing notes.   HISTORY  Chief Complaint Cough    HPI Carrie [name] is a 22 y.o. female who presents to the emergency department complaining of fever, chills, and cough.  Both started yesterday.  She is concerned because she works in a [employer].  She works at [employer].  She denies any chest pain, shortness of breath, vomiting or diarrhea.   Past Medical History:  Diagnosis Date  . Headache     Patient Active Problem List   Diagnosis Date Noted  . Perirectal abscess     Past Surgical History:  Procedure Laterality Date  . INCISION AND DRAINAGE PERIRECTAL ABSCESS N/A 01/21/2016   Procedure: IRRIGATION AND DEBRIDEMENT PERIRECTAL ABSCESS;  Surgeon: Leafy Ro, MD;  Location: [facility] ORS;  Service: General;  Laterality: N/A;    Prior to Admission medications   Medication Sig Start Date End Date Taking? Authorizing Provider  amoxicillin-clavulanate (AUGMENTIN) 875-125 MG tablet Take 1 tablet by mouth 2 (two) times daily. 01/21/16   Lattie Haw, MD  fluticasone (FLONASE) 50 MCG/ACT nasal spray Place 2 sprays into both nostrils daily. 02/21/16   Hagler, Jami L, PA-C  Hydrocodone-Acetaminophen (VICODIN) 5-300 MG TABS Take 1 tablet by mouth every 4 (four) hours as needed. 01/21/16   Lattie Haw, MD  loratadine (CLARITIN) 10 MG tablet Take 1 tablet (10 mg total) by mouth daily. 02/21/16   Hagler, Jami L, PA-C  naproxen (NAPROSYN) 375 MG tablet Take 1 tablet (375 mg total) by mouth 2 (two) times daily with a meal. 05/14/17 05/14/18  Jeanmarie Plant, MD  oseltamivir (TAMIFLU) 75 MG capsule Take 1 capsule (75 mg total) by mouth 2 (two) times daily. 07/15/17   Sonam Wandel, Roselyn Bering, PA-C  polyethylene glycol powder  (GLYCOLAX/MIRALAX) powder MIX 2 CAPFULS IN 8 OUNCES OF WATER AND DRINK ON DAY 1 AND DAY2 THEN TAKE AS NEEDED 01/19/16   [provider]    Allergies Patient has no known allergies.  Family History  Problem Relation Age of Onset  . Hypertension Mother   . Cancer Maternal Aunt   . Heart disease Paternal Grandmother     Social History Social History   Tobacco Use  . Smoking status: Current Every Day Smoker    Packs/day: 0.25    Types: Cigarettes  . Smokeless tobacco: Never Used  Substance Use Topics  . Alcohol use: No  . Drug use: No    Review of Systems  Constitutional: Positive fever/chills Eyes: No visual changes. ENT: No sore throat. Respiratory: Positive cough Genitourinary: Negative for dysuria. Musculoskeletal: Negative for back pain. Skin: Negative for rash.    ____________________________________________   PHYSICAL EXAM:  VITAL SIGNS: ED Triage Vitals  Enc Vitals Group     BP 07/15/17 1015 (!) 132/96     Pulse Rate 07/15/17 1015 75     Resp 07/15/17 1015 20     Temp 07/15/17 1015 98.1 F (36.7 C)     Temp Source 07/15/17 1015 Oral     SpO2 07/15/17 1015 100 %     Weight 07/15/17 1017 143 lb (64.9 kg)     Height 07/15/17 1017 5\' 2"  (1.575 m)     Head Circumference --      Peak Flow --  Pain Score 07/15/17 1017 10     Pain Loc --      Pain Edu? --      Excl. in GC? --     Constitutional: Alert and oriented. Well appearing and in no acute distress.  Patient is febrile Eyes: Conjunctivae are normal.  Head: Atraumatic. Nose: No congestion/rhinnorhea. Mouth/Throat: Mucous membranes are moist.  It is irritated Neck: Is supple, no lymphadenopathy is noted Cardiovascular: Normal rate, regular rhythm.  Heart sounds are normal Respiratory: Normal respiratory effort.  No retractions, lungs clear to auscultation GU: deferred Musculoskeletal: FROM all extremities, warm and well perfused Neurologic:  Normal speech and language.  Skin:  Skin  is warm, dry and intact. No rash noted. Psychiatric: Mood and affect are normal. Speech and behavior are normal.  ____________________________________________   LABS (all labs ordered are listed, but only abnormal results are displayed)  Labs Reviewed  INFLUENZA PANEL BY PCR (TYPE A & B) - Abnormal; Notable for the following components:      Result Value   Influenza A By PCR POSITIVE (*)    All other components within normal limits   ____________________________________________   ____________________________________________  RADIOLOGY    ____________________________________________   PROCEDURES  Procedure(s) performed: No  Procedures    ____________________________________________   INITIAL IMPRESSION / ASSESSMENT AND PLAN / ED COURSE  Pertinent labs & imaging results that were available during my care of the patient were reviewed by me and considered in my medical decision making (see chart for details).  Patient is 22 year old female reporting to the emergency department complaining of fever, chills, and cough.  Physical exam she appears to be febrile and has a dry cough.  Influenza test is positive for influenza A  Test results were discussed with patient.  Told her I have concerns of her returning to work at this time.  She agrees.  She was given a work note stating that she should not return to work until she has not had a fever for 24-48 hours.  She was given a prescription for Tamiflu 75 mg twice daily for 5 days.  She is to drink fluids, take over-the-counter cold medicines as needed.  If she is worsening she should return to the emergency department.  Patient states she understands and will comply with our recommendations.  She was discharged in stable condition      As part of my medical decision making, I reviewed the following data within the electronic MEDICAL RECORD NUMBER Nursing notes reviewed and incorporated, Labs reviewed influenza was positive for  influenza A, Old chart reviewed, Notes from prior ED visits and [place] Controlled Substance Database  ____________________________________________   FINAL CLINICAL IMPRESSION(S) / ED DIAGNOSES  Final diagnoses:  Influenza A      NEW MEDICATIONS STARTED DURING THIS VISIT:  Discharge Medication List as of 07/15/2017 11:14 AM    START taking these medications   Details  oseltamivir (TAMIFLU) 75 MG capsule Take 1 capsule (75 mg total) by mouth 2 (two) times daily., Starting Sat 07/15/2017, Print         Note:  This document was prepared using Dragon voice recognition software and may include unintentional dictation errors.    Faythe GheeFisher, Karlina Suares W, PA-C 07/15/17 1447    Governor RooksLord, Rebecca, MD 07/15/17 [phone]

## 2017-07-15 NOTE — ED Notes (Signed)

## 2017-07-15 NOTE — ED Triage Notes (Signed)
Cough and body aches since yesterday.

## 2017-07-15 NOTE — Discharge Instructions (Signed)
Use medication as prescribed.  Take Tylenol and ibuprofen for fever as needed.  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  May use over-the-counter cough medicine as needed.  Drink plenty of fluids

## 2017-09-20 ENCOUNTER — Emergency Department
Admission: EM | Admit: 2017-09-20 | Discharge: 2017-09-20 | Discharge status: Against Medical Advice | Payer: Medicaid Other | Attending: Emergency Medicine | Admitting: Emergency Medicine

## 2017-09-20 ENCOUNTER — Other Ambulatory Visit: Payer: Self-pay

## 2017-09-20 DIAGNOSIS — J029 Acute pharyngitis, unspecified: Secondary | ICD-10-CM | POA: Insufficient documentation

## 2017-09-20 DIAGNOSIS — Z5321 Procedure and treatment not carried out due to patient leaving prior to being seen by health care provider: Secondary | ICD-10-CM | POA: Insufficient documentation

## 2017-09-20 NOTE — ED Triage Notes (Signed)
Pt c/o sore throat for the past 2 weeks. Denies fevers.

## 2017-09-20 NOTE — ED Notes (Signed)
See triage note   States she developed a sore throat about 2 weeks ago.. No fever  States she has tried OTC meds  W/o relief   Afebrile on arrival

## 2017-09-20 NOTE — ED Notes (Signed)
Pt not in room for provider   

## 2017-10-03 ENCOUNTER — Encounter: Payer: Self-pay | Admitting: *Deleted

## 2017-10-03 ENCOUNTER — Other Ambulatory Visit: Payer: Self-pay

## 2017-10-03 ENCOUNTER — Emergency Department
Admission: EM | Admit: 2017-10-03 | Discharge: 2017-10-03 | Disposition: A | Discharge status: Home / Self Care | Payer: Medicaid Other | Attending: Emergency Medicine | Admitting: Emergency Medicine

## 2017-10-03 DIAGNOSIS — J029 Acute pharyngitis, unspecified: Secondary | ICD-10-CM | POA: Insufficient documentation

## 2017-10-03 DIAGNOSIS — Z79899 Other long term (current) drug therapy: Secondary | ICD-10-CM | POA: Insufficient documentation

## 2017-10-03 DIAGNOSIS — F17210 Nicotine dependence, cigarettes, uncomplicated: Secondary | ICD-10-CM | POA: Insufficient documentation

## 2017-10-03 LAB — GROUP A STREP BY PCR: Group A Strep by PCR: NOT DETECTED

## 2017-10-03 LAB — MONONUCLEOSIS SCREEN: Mono Screen: NEGATIVE

## 2017-10-03 MED ORDER — DEXAMETHASONE SODIUM PHOSPHATE 10 MG/ML IJ SOLN
INTRAMUSCULAR | Status: AC
  Administered 2017-10-03: 10 mg via ORAL
  Filled 2017-10-03: qty 1

## 2017-10-03 MED ORDER — LIDOCAINE VISCOUS 2 % MT SOLN
15.0000 mL | Freq: Once | OROMUCOSAL | Status: AC
  Administered 2017-10-03: 15 mL via OROMUCOSAL
  Filled 2017-10-03: qty 15

## 2017-10-03 MED ORDER — LIDOCAINE VISCOUS 2 % MT SOLN: 20.0000 mL | OROMUCOSAL | 0 refills | Status: DC | PRN

## 2017-10-03 MED ORDER — DEXAMETHASONE 1 MG/ML PO CONC
10.0000 mg | Freq: Once | ORAL | Status: DC
  Filled 2017-10-03: qty 10

## 2017-10-03 NOTE — Discharge Instructions (Addendum)
Please follow up with the acute care clinic for further evaluation of your sore throat

## 2017-10-03 NOTE — ED Triage Notes (Signed)
Throat congestion for about 2 weeks, associated with nasal congestion. No fevers, no meds PTA

## 2017-10-03 NOTE — ED Provider Notes (Signed)
[facility] Emergency Department Provider Note   ____________________________________________   First MD Initiated Contact with Patient 10/03/17 [phone]     (approximate)  I have reviewed the triage vital signs and the nursing notes.   HISTORY  Chief Complaint Sore Throat    HPI Carrie [name] is a 22 y.o. female who comes into the hospital today with a sore throat for the past 2 weeks.  The patient states that she was reading about her symptoms on the Internet and became concerned.  She was concerned that maybe she had some problem with her tonsils or throat cancer.  The patient thought maybe she had allergies initially so she has been using nasal spray and Benadryl as well as cough drops.  The patient rates her pain a 6 out of 10 in intensity currently.  The patient denies any sick contacts.  She recently had the flu about 3 weeks ago.  She denies any cough or runny nose.  She is here today for evaluation.   Past Medical History:  Diagnosis Date  . Headache     Patient Active Problem List   Diagnosis Date Noted  . Perirectal abscess     Past Surgical History:  Procedure Laterality Date  . INCISION AND DRAINAGE PERIRECTAL ABSCESS N/A 01/21/2016   Procedure: IRRIGATION AND DEBRIDEMENT PERIRECTAL ABSCESS;  Surgeon: Leafy Ro, MD;  Location: [facility] ORS;  Service: General;  Laterality: N/A;    Prior to Admission medications   Medication Sig Start Date End Date Taking? Authorizing Provider  amoxicillin-clavulanate (AUGMENTIN) 875-125 MG tablet Take 1 tablet by mouth 2 (two) times daily. 01/21/16   Lattie Haw, MD  fluticasone (FLONASE) 50 MCG/ACT nasal spray Place 2 sprays into both nostrils daily. 02/21/16   Hagler, Jami L, PA-C  Hydrocodone-Acetaminophen (VICODIN) 5-300 MG TABS Take 1 tablet by mouth every 4 (four) hours as needed. 01/21/16   Lattie Haw, MD  lidocaine (XYLOCAINE) 2 % solution Use as directed 20 mLs in the mouth or throat as  needed for mouth pain. 10/03/17   Rebecka Apley, MD  loratadine (CLARITIN) 10 MG tablet Take 1 tablet (10 mg total) by mouth daily. 02/21/16   Hagler, Jami L, PA-C  naproxen (NAPROSYN) 375 MG tablet Take 1 tablet (375 mg total) by mouth 2 (two) times daily with a meal. 05/14/17 05/14/18  Jeanmarie Plant, MD  oseltamivir (TAMIFLU) 75 MG capsule Take 1 capsule (75 mg total) by mouth 2 (two) times daily. 07/15/17   Fisher, Roselyn Bering, PA-C  polyethylene glycol powder (GLYCOLAX/MIRALAX) powder MIX 2 CAPFULS IN 8 OUNCES OF WATER AND DRINK ON DAY 1 AND DAY2 THEN TAKE AS NEEDED 01/19/16   [provider]    Allergies Patient has no known allergies.  Family History  Problem Relation Age of Onset  . Hypertension Mother   . Cancer Maternal Aunt   . Heart disease Paternal Grandmother     Social History Social History   Tobacco Use  . Smoking status: Current Every Day Smoker    Packs/day: 0.25    Types: Cigarettes  . Smokeless tobacco: Never Used  Substance Use Topics  . Alcohol use: No  . Drug use: No    Review of Systems  Constitutional: No fever/chills Eyes: No visual changes. ENT:  sore throat. Cardiovascular: Denies chest pain. Respiratory: Denies shortness of breath. Gastrointestinal: No abdominal pain.  No nausea, no vomiting.  No diarrhea.  No constipation. Genitourinary: Negative for dysuria. Musculoskeletal:  Negative for back pain. Skin: Negative for rash. Neurological: Negative for headaches, focal weakness or numbness.   ____________________________________________   PHYSICAL EXAM:  VITAL SIGNS: ED Triage Vitals [10/03/17 0056]  Enc Vitals Group     BP (!) 128/51     Pulse Rate 86     Resp 16     Temp 98.5 F (36.9 C)     Temp Source Oral     SpO2 99 %     Weight      Height      Head Circumference      Peak Flow      Pain Score 5     Pain Loc      Pain Edu?      Excl. in GC?     Constitutional: Alert and oriented. Well appearing and in no  acute distress. Eyes: Conjunctivae are normal. PERRL. EOMI. Head: Atraumatic. Nose: No congestion/rhinnorhea. Mouth/Throat: Mucous membranes are moist.  Oropharynx non-erythematous. Neck: No stridor.   Hematological/Lymphatic/Immunilogical: Anterior cervical lymphadenopathy. Cardiovascular: Normal rate, regular rhythm. Grossly normal heart sounds.   Respiratory: Normal respiratory effort.  No retractions. . Gastrointestinal: Soft and nontender. No distention.  Musculoskeletal: No lower extremity tenderness nor edema.   Neurologic:  Normal speech and language.  Skin:  Skin is warm, dry and intact.  Psychiatric: Mood and affect are normal.   ____________________________________________   LABS (all labs ordered are listed, but only abnormal results are displayed)  Labs Reviewed  GROUP A STREP BY PCR  MONONUCLEOSIS SCREEN   ____________________________________________  EKG  none ____________________________________________  RADIOLOGY  ED MD interpretation:  none  Official radiology report(s): No results found.  ____________________________________________   PROCEDURES  Procedure(s) performed: None  Procedures  Critical Care performed: No  ____________________________________________   INITIAL IMPRESSION / ASSESSMENT AND PLAN / ED COURSE  As part of my medical decision making, I reviewed the following data within the electronic MEDICAL RECORD NUMBER Notes from prior ED visits and [place] Controlled Substance Database   This is a 22 year old female who comes into the hospital today with a sore throat for the past 2 weeks.  We did perform a strep on the patient and it was negative.  I will order a mono given the patient's anterior cervical lymphadenopathy.  I ordered some dexamethasone and viscous lidocaine for the patient.  She will be reassessed.  The patient's mono has returned negative.  The patient will be discharged home to follow-up with the acute care clinic.        ____________________________________________   FINAL CLINICAL IMPRESSION(S) / ED DIAGNOSES  Final diagnoses:  Pharyngitis, unspecified etiology  Sore throat     ED Discharge Orders        Ordered    lidocaine (XYLOCAINE) 2 % solution  As needed     10/03/17 0550       Note:  This document was prepared using Dragon voice recognition software and may include unintentional dictation errors.    Rebecka Apley, MD 10/03/17 [phone]

## 2018-04-08 ENCOUNTER — Other Ambulatory Visit: Payer: Self-pay

## 2018-04-08 ENCOUNTER — Emergency Department
Admission: EM | Admit: 2018-04-08 | Discharge: 2018-04-08 | Disposition: A | Discharge status: Home / Self Care | Payer: Medicaid Other | Attending: Emergency Medicine | Admitting: Emergency Medicine

## 2018-04-08 ENCOUNTER — Encounter: Payer: Self-pay | Admitting: *Deleted

## 2018-04-08 ENCOUNTER — Emergency Department: Payer: Medicaid Other

## 2018-04-08 DIAGNOSIS — J101 Influenza due to other identified influenza virus with other respiratory manifestations: Secondary | ICD-10-CM

## 2018-04-08 DIAGNOSIS — F17210 Nicotine dependence, cigarettes, uncomplicated: Secondary | ICD-10-CM | POA: Insufficient documentation

## 2018-04-08 DIAGNOSIS — Z79899 Other long term (current) drug therapy: Secondary | ICD-10-CM | POA: Insufficient documentation

## 2018-04-08 LAB — CBC
HEMATOCRIT: 45.1 % (ref 36.0–46.0)
HEMOGLOBIN: 14.9 g/dL (ref 12.0–15.0)
MCH: 30 pg (ref 26.0–34.0)
MCHC: 33 g/dL (ref 30.0–36.0)
MCV: 90.9 fL (ref 80.0–100.0)
Platelets: 252 10*3/uL (ref 150–400)
RBC: 4.96 MIL/uL (ref 3.87–5.11)
RDW: 13.4 % (ref 11.5–15.5)
WBC: 4.2 10*3/uL (ref 4.0–10.5)
nRBC: 0 % (ref 0.0–0.2)

## 2018-04-08 LAB — COMPREHENSIVE METABOLIC PANEL
ALBUMIN: 3.8 g/dL (ref 3.5–5.0)
ALT: 17 U/L (ref 0–44)
AST: 22 U/L (ref 15–41)
Alkaline Phosphatase: 50 U/L (ref 38–126)
Anion gap: 9 (ref 5–15)
BILIRUBIN TOTAL: 0.4 mg/dL (ref 0.3–1.2)
BUN: 9 mg/dL (ref 6–20)
CO2: 23 mmol/L (ref 22–32)
CREATININE: 0.96 mg/dL (ref 0.44–1.00)
Calcium: 8.6 mg/dL — ABNORMAL LOW (ref 8.9–10.3)
Chloride: 108 mmol/L (ref 98–111)
GFR calc Af Amer: >60 mL/min (ref >60)
GFR calc non Af Amer: >60 mL/min (ref >60)
GLUCOSE: 114 mg/dL — AB (ref 70–99)
POTASSIUM: 3.6 mmol/L (ref 3.5–5.1)
Sodium: 140 mmol/L (ref 135–145)
TOTAL PROTEIN: 7.5 g/dL (ref 6.5–8.1)

## 2018-04-08 LAB — URINALYSIS, COMPLETE (UACMP) WITH MICROSCOPIC
BACTERIA UA: NONE SEEN
Bilirubin Urine: NEGATIVE
Glucose, UA: NEGATIVE mg/dL
Hgb urine dipstick: NEGATIVE
KETONES UR: NEGATIVE mg/dL
Leukocytes, UA: NEGATIVE
Nitrite: NEGATIVE
PROTEIN: 30 mg/dL — AB
Specific Gravity, Urine: 1.043 — ABNORMAL HIGH (ref 1.005–1.030)
pH: 5 (ref 5.0–8.0)

## 2018-04-08 LAB — INFLUENZA PANEL BY PCR (TYPE A & B)
INFLBPCR: POSITIVE — AB
Influenza A By PCR: NEGATIVE

## 2018-04-08 LAB — LIPASE, BLOOD: Lipase: 28 U/L (ref 11–51)

## 2018-04-08 LAB — POC URINE PREG, ED: Preg Test, Ur: NEGATIVE

## 2018-04-08 MED ORDER — SODIUM CHLORIDE 0.9 % IV BOLUS
1000.0000 mL | Freq: Once | INTRAVENOUS | Status: AC
  Administered 2018-04-08: 1000 mL via INTRAVENOUS

## 2018-04-08 MED ORDER — IBUPROFEN 600 MG PO TABS: 600.0000 mg | ORAL_TABLET | Freq: Four times a day (QID) | ORAL | 0 refills | Status: DC | PRN

## 2018-04-08 MED ORDER — ONDANSETRON HCL 4 MG/2ML IJ SOLN
4.0000 mg | Freq: Once | INTRAMUSCULAR | Status: AC
  Administered 2018-04-08: 4 mg via INTRAVENOUS
  Filled 2018-04-08: qty 2

## 2018-04-08 MED ORDER — ONDANSETRON 4 MG PO TBDP: 4.0000 mg | ORAL_TABLET | Freq: Three times a day (TID) | ORAL | 0 refills | Status: DC | PRN

## 2018-04-08 NOTE — ED Provider Notes (Signed)
[facility] Emergency Department Provider Note  Time seen: 1:58 PM  I have reviewed the triage vital signs and the nursing notes.   HISTORY  Chief Complaint Fever and Emesis    HPI Carrie [name] is a 22 y.o. female with no significant past medical history presents to the emergency department for subjective fever, cough, congestion, headaches body aches nausea and vomiting over the past 2 to 3 days.  According to the patient over the past 2 to 3 days she has been coughing occasionally with sputum production now with dark brown sputum production.  States she has been very nauseated over the past 2 days anytime she attempts to eat or drink she will vomit per patient.  Subjective fever 2 days ago but has not had a fever since.  States body aches headache in both of her eyes per patient but denies any pain in her head.  No neck pain.   Past Medical History:  Diagnosis Date  . Headache     Patient Active Problem List   Diagnosis Date Noted  . Perirectal abscess     Past Surgical History:  Procedure Laterality Date  . INCISION AND DRAINAGE PERIRECTAL ABSCESS N/A 01/21/2016   Procedure: IRRIGATION AND DEBRIDEMENT PERIRECTAL ABSCESS;  Surgeon: Leafy Ro, MD;  Location: [facility] ORS;  Service: General;  Laterality: N/A;    Prior to Admission medications   Medication Sig Start Date End Date Taking? Authorizing Provider  amoxicillin-clavulanate (AUGMENTIN) 875-125 MG tablet Take 1 tablet by mouth 2 (two) times daily. 01/21/16   Lattie Haw, MD  fluticasone (FLONASE) 50 MCG/ACT nasal spray Place 2 sprays into both nostrils daily. 02/21/16   Hagler, Jami L, PA-C  Hydrocodone-Acetaminophen (VICODIN) 5-300 MG TABS Take 1 tablet by mouth every 4 (four) hours as needed. 01/21/16   Lattie Haw, MD  lidocaine (XYLOCAINE) 2 % solution Use as directed 20 mLs in the mouth or throat as needed for mouth pain. 10/03/17   Rebecka Apley, MD  loratadine (CLARITIN) 10 MG  tablet Take 1 tablet (10 mg total) by mouth daily. 02/21/16   Hagler, Jami L, PA-C  naproxen (NAPROSYN) 375 MG tablet Take 1 tablet (375 mg total) by mouth 2 (two) times daily with a meal. 05/14/17 05/14/18  Jeanmarie Plant, MD  oseltamivir (TAMIFLU) 75 MG capsule Take 1 capsule (75 mg total) by mouth 2 (two) times daily. 07/15/17   Fisher, Roselyn Bering, PA-C  polyethylene glycol powder (GLYCOLAX/MIRALAX) powder MIX 2 CAPFULS IN 8 OUNCES OF WATER AND DRINK ON DAY 1 AND DAY2 THEN TAKE AS NEEDED 01/19/16   [provider]    No Known Allergies  Family History  Problem Relation Age of Onset  . Hypertension Mother   . Cancer Maternal Aunt   . Heart disease Paternal Grandmother     Social History Social History   Tobacco Use  . Smoking status: Current Every Day Smoker    Packs/day: 0.25    Types: Cigarettes  . Smokeless tobacco: Never Used  Substance Use Topics  . Alcohol use: No  . Drug use: No    Review of Systems Constitutional: Negative for fever. Eyes: Negative for visual complaints ENT: Mild congestion Cardiovascular: Negative for chest pain. Respiratory: Negative for shortness of breath. Gastrointestinal: Negative for abdominal pain.  Positive for nausea vomiting.  Negative for diarrhea. Genitourinary: Negative for urinary compaints.  Last menstrual period was 2 weeks ago.  No vaginal bleeding or discharge. Musculoskeletal: Body aches  Skin: Negative for skin complaints  Neurological: Mild headache All other ROS negative  ____________________________________________   PHYSICAL EXAM:  VITAL SIGNS: ED Triage Vitals  Enc Vitals Group     BP 04/08/18 1305 107/67     Pulse Rate 04/08/18 1305 80     Resp 04/08/18 1305 16     Temp 04/08/18 1305 98.2 F (36.8 C)     Temp Source 04/08/18 1305 Oral     SpO2 04/08/18 1305 100 %     Weight 04/08/18 1307 154 lb (69.9 kg)     Height 04/08/18 1307 5\' 2"  (1.575 m)     Head Circumference --      Peak Flow --      Pain  Score 04/08/18 1306 7     Pain Loc --      Pain Edu? --      Excl. in GC? --    Constitutional: Alert and oriented. Well appearing and in no distress. Eyes: Normal exam ENT   Head: Normocephalic and atraumatic.   Mouth/Throat: Mucous membranes are moist. Cardiovascular: Normal rate, regular rhythm. No murmur Respiratory: Normal respiratory effort without tachypnea nor retractions. Breath sounds are clear  Gastrointestinal: Soft and nontender. No distention.  Musculoskeletal: Nontender with normal range of motion in all extremities.  Neurologic:  Normal speech and language. No gross focal neurologic deficits Skin:  Skin is warm, dry and intact.  Psychiatric: Mood and affect are normal  ____________________________________________     RADIOLOGY  Chest x-ray negative  ____________________________________________   INITIAL IMPRESSION / ASSESSMENT AND PLAN / ED COURSE  Pertinent labs & imaging results that were available during my care of the patient were reviewed by me and considered in my medical decision making (see chart for details).  Patient presents to the emergency department with complaints of 2 to 3 days of generalized body aches, subjective fever, headache "in my eyes", cough congestion, now with brown sputum production.  Differential would include upper respiratory infection, pneumonia, influenza.  We will check labs, IV hydrate, obtain a chest x-ray check an influenza swab and continue to closely monitor.  Patient agreeable to plan of care.  We will treat with Toradol and Zofran.  Patient's labs are reassuring, urinalysis appears most consistent with contamination.  White blood cell count is normal chemistry is normal.  Patency test is negative.  Awaiting chest x-ray and influenza test results.  Chest x-ray is negative.  Labs have resulted positive for influenza B.  Symptoms have been ongoing for 2 to 3 days, not a candidate for Tamiflu.  We will discharge with  supportive care, nausea medication, Tylenol/ibuprofen as needed for discomfort.  Patient agreeable to plan of care.  ____________________________________________   FINAL CLINICAL IMPRESSION(S) / ED DIAGNOSES  Upper respiratory infection Nausea vomiting Influenza    Minna Antis, MD 04/08/18 1431

## 2018-04-08 NOTE — ED Triage Notes (Signed)
Pt to ED reporting nausea and vomiting with a  Fevers since Thursday. Pt reporting she is unable to keep food or drink down and had a fever of 100.6 two days ago. Pt in NAD at this time but reports generalized abd pain has been consistant without diarrhea.

## 2018-04-08 NOTE — ED Notes (Signed)
Pt transported to xray 

## 2018-04-08 NOTE — ED Notes (Signed)
Pt returned from xray

## 2018-04-08 NOTE — ED Triage Notes (Signed)
First Nurse Note:  C/O fever, sinus congestion, cough x 2 days.  Taking OTC medication. Alka Seltzer Plus taken today.  AAOx3.  Skin warm and dry. NAD

## 2018-06-06 ENCOUNTER — Encounter: Payer: Self-pay | Admitting: Emergency Medicine

## 2018-06-06 ENCOUNTER — Emergency Department
Admission: EM | Admit: 2018-06-06 | Discharge: 2018-06-06 | Disposition: A | Discharge status: Home / Self Care | Payer: Medicaid Other | Attending: Emergency Medicine | Admitting: Emergency Medicine

## 2018-06-06 DIAGNOSIS — G43001 Migraine without aura, not intractable, with status migrainosus: Secondary | ICD-10-CM

## 2018-06-06 DIAGNOSIS — F17210 Nicotine dependence, cigarettes, uncomplicated: Secondary | ICD-10-CM | POA: Insufficient documentation

## 2018-06-06 DIAGNOSIS — Z79899 Other long term (current) drug therapy: Secondary | ICD-10-CM | POA: Insufficient documentation

## 2018-06-06 LAB — POCT PREGNANCY, URINE: PREG TEST UR: NEGATIVE

## 2018-06-06 LAB — COMPREHENSIVE METABOLIC PANEL
ALBUMIN: 4.3 g/dL (ref 3.5–5.0)
ALT: 17 U/L (ref 0–44)
AST: 20 U/L (ref 15–41)
Alkaline Phosphatase: 62 U/L (ref 38–126)
Anion gap: 11 (ref 5–15)
BUN: 8 mg/dL (ref 6–20)
CHLORIDE: 106 mmol/L (ref 98–111)
CO2: 22 mmol/L (ref 22–32)
CREATININE: 0.89 mg/dL (ref 0.44–1.00)
Calcium: 9.2 mg/dL (ref 8.9–10.3)
GFR calc Af Amer: >60 mL/min (ref >60)
GFR calc non Af Amer: >60 mL/min (ref >60)
GLUCOSE: 81 mg/dL (ref 70–99)
Potassium: 3.5 mmol/L (ref 3.5–5.1)
Sodium: 139 mmol/L (ref 135–145)
Total Bilirubin: 0.6 mg/dL (ref 0.3–1.2)
Total Protein: 7.4 g/dL (ref 6.5–8.1)

## 2018-06-06 LAB — URINALYSIS, COMPLETE (UACMP) WITH MICROSCOPIC
BACTERIA UA: NONE SEEN
Bilirubin Urine: NEGATIVE
Glucose, UA: NEGATIVE mg/dL
HGB URINE DIPSTICK: NEGATIVE
Ketones, ur: 20 mg/dL — AB
Leukocytes, UA: NEGATIVE
NITRITE: NEGATIVE
PROTEIN: NEGATIVE mg/dL
Specific Gravity, Urine: 1.021 (ref 1.005–1.030)
pH: 7 (ref 5.0–8.0)

## 2018-06-06 LAB — CBC
HEMATOCRIT: 40.8 % (ref 36.0–46.0)
Hemoglobin: 13.4 g/dL (ref 12.0–15.0)
MCH: 30.1 pg (ref 26.0–34.0)
MCHC: 32.8 g/dL (ref 30.0–36.0)
MCV: 91.7 fL (ref 80.0–100.0)
Platelets: 283 10*3/uL (ref 150–400)
RBC: 4.45 MIL/uL (ref 3.87–5.11)
RDW: 13.6 % (ref 11.5–15.5)
WBC: 7.9 10*3/uL (ref 4.0–10.5)
nRBC: 0 % (ref 0.0–0.2)

## 2018-06-06 LAB — LIPASE, BLOOD: Lipase: 21 U/L (ref 11–51)

## 2018-06-06 MED ORDER — BUTALBITAL-APAP-CAFFEINE 50-325-40 MG PO TABS: 1.0000 | ORAL_TABLET | Freq: Four times a day (QID) | ORAL | 0 refills | Status: DC | PRN

## 2018-06-06 NOTE — ED Triage Notes (Signed)
Patient presents to the ED with body aches, vomiting and headache since yesterday.  Patient states her temple is tender to the touch.  Patient reports vomiting x 2 in 24 hours.  Patient is in no obvious distress at this time.  Patient states the headaches have been intermittent.

## 2018-06-06 NOTE — ED Provider Notes (Signed)
[facility] Emergency Department Provider Note  ____________________________________________   First MD Initiated Contact with Patient 06/06/18 1818     (approximate)  I have reviewed the triage vital signs and the nursing notes.   HISTORY  Chief Complaint Headache and Emesis    HPI Carrie [name] is a 23 y.o. female with a history of migraine headaches was presented emergency department left-sided headache at this time.  She describes the headache as a throbbing that is been ongoing over the past 24 hours.  Is had 2 episodes of vomiting.  Also with associated body aches.  Does not report any neck pain.  No fever.  At this point, she states that she has been sleeping and that the pain is a 3-4 and much improved.  No nausea at this time.  Says that she used to be on a migraine medication that she would take nightly but does not remember the name of this medication.  Does not report any sudden onset or worst headache of her life sensation.  Says that she likely developed this headache secondary to stress and being tired per does not report any suicidal or homicidal ideation.  Patient denies any runny nose or cough.   Past Medical History:  Diagnosis Date  . Headache     Patient Active Problem List   Diagnosis Date Noted  . Perirectal abscess     Past Surgical History:  Procedure Laterality Date  . INCISION AND DRAINAGE PERIRECTAL ABSCESS N/A 01/21/2016   Procedure: IRRIGATION AND DEBRIDEMENT PERIRECTAL ABSCESS;  Surgeon: Leafy Roiego F Pabon, MD;  Location: [facility] ORS;  Service: General;  Laterality: N/A;    Prior to Admission medications   Medication Sig Start Date End Date Taking? Authorizing Provider  amoxicillin-clavulanate (AUGMENTIN) 875-125 MG tablet Take 1 tablet by mouth 2 (two) times daily. 01/21/16   Lattie Hawooper, Richard E, MD  fluticasone (FLONASE) 50 MCG/ACT nasal spray Place 2 sprays into both nostrils daily. 02/21/16   Hagler, Jami L, PA-C    Hydrocodone-Acetaminophen (VICODIN) 5-300 MG TABS Take 1 tablet by mouth every 4 (four) hours as needed. 01/21/16   Lattie Hawooper, Richard E, MD  ibuprofen (ADVIL,MOTRIN) 600 MG tablet Take 1 tablet (600 mg total) by mouth every 6 (six) hours as needed. 04/08/18   Minna AntisPaduchowski, Kevin, MD  lidocaine (XYLOCAINE) 2 % solution Use as directed 20 mLs in the mouth or throat as needed for mouth pain. 10/03/17   Rebecka ApleyWebster, Allison P, MD  loratadine (CLARITIN) 10 MG tablet Take 1 tablet (10 mg total) by mouth daily. 02/21/16   Hagler, Jami L, PA-C  ondansetron (ZOFRAN ODT) 4 MG disintegrating tablet Take 1 tablet (4 mg total) by mouth every 8 (eight) hours as needed for nausea or vomiting. 04/08/18   Minna AntisPaduchowski, Kevin, MD  oseltamivir (TAMIFLU) 75 MG capsule Take 1 capsule (75 mg total) by mouth 2 (two) times daily. 07/15/17   Fisher, Roselyn BeringSusan W, PA-C  polyethylene glycol powder (GLYCOLAX/MIRALAX) powder MIX 2 CAPFULS IN 8 OUNCES OF WATER AND DRINK ON DAY 1 AND DAY2 THEN TAKE AS NEEDED 01/19/16   [provider]    Allergies Patient has no known allergies.  Family History  Problem Relation Age of Onset  . Hypertension Mother   . Cancer Maternal Aunt   . Heart disease Paternal Grandmother     Social History Social History   Tobacco Use  . Smoking status: Current Every Day Smoker    Packs/day: 0.25    Types: Cigarettes  .  Smokeless tobacco: Never Used  Substance Use Topics  . Alcohol use: No  . Drug use: No    Review of Systems  Constitutional: No fever/chills Eyes: No visual changes. ENT: No sore throat. Cardiovascular: Denies chest pain. Respiratory: Denies shortness of breath. Gastrointestinal: No abdominal pain. No diarrhea.  No constipation. Genitourinary: Negative for dysuria. Musculoskeletal: Muscle aches Skin: Negative for rash. Neurological: Negative for focal weakness or numbness.   ____________________________________________   PHYSICAL EXAM:  VITAL SIGNS: ED Triage  Vitals  Enc Vitals Group     BP 06/06/18 1637 124/72     Pulse Rate 06/06/18 1637 91     Resp 06/06/18 1637 14     Temp 06/06/18 1637 98.6 F (37 C)     Temp Source 06/06/18 1637 Oral     SpO2 06/06/18 1637 97 %     Weight 06/06/18 1637 145 lb (65.8 kg)     Height 06/06/18 1637 5' 2.5" (1.588 m)     Head Circumference --      Peak Flow --      Pain Score 06/06/18 1642 6     Pain Loc --      Pain Edu? --      Excl. in GC? --     Constitutional: Alert and oriented. Well appearing and in no acute distress. Eyes: Conjunctivae are normal.  Head: Atraumatic. Nose: No congestion/rhinnorhea. Mouth/Throat: Mucous membranes are moist.  Neck: No stridor.   Cardiovascular: Normal rate, regular rhythm. Grossly normal heart sounds.  Respiratory: Normal respiratory effort.  No retractions. Lungs CTAB. Gastrointestinal: Soft and nontender. No distention.  Musculoskeletal: No lower extremity tenderness nor edema.  No joint effusions. Neurologic:  Normal speech and language. No gross focal neurologic deficits are appreciated. Skin:  Skin is warm, dry and intact. No rash noted. Psychiatric: Mood and affect are normal. Speech and behavior are normal.  ____________________________________________   LABS (all labs ordered are listed, but only abnormal results are displayed)  Labs Reviewed  URINALYSIS, COMPLETE (UACMP) WITH MICROSCOPIC - Abnormal; Notable for the following components:      Result Value   Color, Urine YELLOW (*)    APPearance CLEAR (*)    Ketones, ur 20 (*)    All other components within normal limits  LIPASE, BLOOD  COMPREHENSIVE METABOLIC PANEL  CBC  POCT PREGNANCY, URINE  POC URINE PREG, ED   ____________________________________________  EKG   ____________________________________________  RADIOLOGY   ____________________________________________   PROCEDURES  Procedure(s) performed:   Procedures  Critical Care performed:    ____________________________________________   INITIAL IMPRESSION / ASSESSMENT AND PLAN / ED COURSE  Pertinent labs & imaging results that were available during my care of the patient were reviewed by me and considered in my medical decision making (see chart for details).  Differential diagnosis includes, but is not limited to, intracranial hemorrhage, meningitis/encephalitis, previous head trauma, cavernous venous thrombosis, tension headache, temporal arteritis, migraine or migraine equivalent, idiopathic intracranial hypertension, and non-specific headache. As part of my medical decision making, I reviewed the following data within the electronic MEDICAL RECORD NUMBER Notes from prior ED visits  Patient with much improved symptoms.  Says that the symptoms are typical of her migraines for the past.  Will discharge with Fioricet to be used as needed.  Will give patient follow-up with neurology.  She is understanding the diagnosis well treatment and willing to comply. ____________________________________________   FINAL CLINICAL IMPRESSION(S) / ED DIAGNOSES  Migraine headache  NEW MEDICATIONS STARTED DURING THIS  VISIT:  New Prescriptions   No medications on file     Note:  This document was prepared using Dragon voice recognition software and may include unintentional dictation errors.     Myrna BlazerSchaevitz,  Matthew, MD 06/06/18 [phone]

## 2018-08-24 ENCOUNTER — Other Ambulatory Visit: Payer: Self-pay

## 2018-08-24 ENCOUNTER — Emergency Department
Admission: EM | Admit: 2018-08-24 | Discharge: 2018-08-24 | Disposition: A | Discharge status: Home / Self Care | Payer: Medicaid Other | Attending: Emergency Medicine | Admitting: Emergency Medicine

## 2018-08-24 DIAGNOSIS — F17210 Nicotine dependence, cigarettes, uncomplicated: Secondary | ICD-10-CM | POA: Insufficient documentation

## 2018-08-24 DIAGNOSIS — J020 Streptococcal pharyngitis: Secondary | ICD-10-CM | POA: Insufficient documentation

## 2018-08-24 DIAGNOSIS — Z79899 Other long term (current) drug therapy: Secondary | ICD-10-CM | POA: Insufficient documentation

## 2018-08-24 LAB — GROUP A STREP BY PCR: Group A Strep by PCR: NOT DETECTED

## 2018-08-24 MED ORDER — AMOXICILLIN 875 MG PO TABS: 875.0000 mg | ORAL_TABLET | Freq: Two times a day (BID) | ORAL | 0 refills | Status: AC

## 2018-08-24 NOTE — ED Notes (Signed)
Patient states having really sore throat for 3 days. Patient states it even hurts to move neck and touch. Patient states having a lot of post nasal drainage.

## 2018-08-24 NOTE — ED Triage Notes (Signed)
Pt states three days of sore throat. Pt denies cough, fever. Pt appears in no acute distress.

## 2018-08-24 NOTE — ED Provider Notes (Signed)
[facility] Emergency Department Provider Note  ____________________________________________  Time seen: Approximately 10:59 PM  I have reviewed the triage vital signs and the nursing notes.   HISTORY  Chief Complaint Sore Throat    HPI Carrie [name] is a 23 y.o. female presents to the emergency department with mild headache and pharyngitis for the past 3 days.  Patient has also had low-grade fever and chills.  No rhinorrhea, congestion or nonproductive cough.  Patient is speaking in complete sentences and managing her own secretions.  She denies nausea or vomiting.  No other alleviating measures have been attempted.   Past Medical History:  Diagnosis Date  . Headache     Patient Active Problem List   Diagnosis Date Noted  . Perirectal abscess     Past Surgical History:  Procedure Laterality Date  . INCISION AND DRAINAGE PERIRECTAL ABSCESS N/A 01/21/2016   Procedure: IRRIGATION AND DEBRIDEMENT PERIRECTAL ABSCESS;  Surgeon: Leafy Ro, MD;  Location: [facility] ORS;  Service: General;  Laterality: N/A;    Prior to Admission medications   Medication Sig Start Date End Date Taking? Authorizing Provider  amoxicillin (AMOXIL) 875 MG tablet Take 1 tablet (875 mg total) by mouth 2 (two) times daily for 10 days. 08/24/18 09/03/18  Pia Mau M, PA-C  butalbital-acetaminophen-caffeine (FIORICET, ESGIC) [phone] MG tablet Take 1-2 tablets by mouth every 6 (six) hours as needed for headache. 06/06/18 06/06/19  Myrna Blazer, MD  fluticasone (FLONASE) 50 MCG/ACT nasal spray Place 2 sprays into both nostrils daily. 02/21/16   Hagler, Jami L, PA-C  Hydrocodone-Acetaminophen (VICODIN) 5-300 MG TABS Take 1 tablet by mouth every 4 (four) hours as needed. 01/21/16   Lattie Haw, MD  ibuprofen (ADVIL,MOTRIN) 600 MG tablet Take 1 tablet (600 mg total) by mouth every 6 (six) hours as needed. 04/08/18   Minna Antis, MD  lidocaine (XYLOCAINE) 2 % solution  Use as directed 20 mLs in the mouth or throat as needed for mouth pain. 10/03/17   Rebecka Apley, MD  loratadine (CLARITIN) 10 MG tablet Take 1 tablet (10 mg total) by mouth daily. 02/21/16   Hagler, Jami L, PA-C  ondansetron (ZOFRAN ODT) 4 MG disintegrating tablet Take 1 tablet (4 mg total) by mouth every 8 (eight) hours as needed for nausea or vomiting. 04/08/18   Minna Antis, MD  oseltamivir (TAMIFLU) 75 MG capsule Take 1 capsule (75 mg total) by mouth 2 (two) times daily. 07/15/17   Fisher, Roselyn Bering, PA-C  polyethylene glycol powder (GLYCOLAX/MIRALAX) powder MIX 2 CAPFULS IN 8 OUNCES OF WATER AND DRINK ON DAY 1 AND DAY2 THEN TAKE AS NEEDED 01/19/16   [provider]    Allergies Patient has no known allergies.  Family History  Problem Relation Age of Onset  . Hypertension Mother   . Cancer Maternal Aunt   . Heart disease Paternal Grandmother     Social History Social History   Tobacco Use  . Smoking status: Current Every Day Smoker    Packs/day: 0.25    Types: Cigarettes  . Smokeless tobacco: Never Used  Substance Use Topics  . Alcohol use: No  . Drug use: No     Review of Systems  Constitutional: Patient has fever and chills.  Eyes: No visual changes. No discharge ENT: Patient has pharyngitis.  Cardiovascular: no chest pain. Respiratory: no cough. No SOB. Gastrointestinal: No abdominal pain.  No nausea, no vomiting.  No diarrhea.  No constipation. Genitourinary: Negative for dysuria. No  hematuria Musculoskeletal: Negative for musculoskeletal pain. Skin: Negative for rash, abrasions, lacerations, ecchymosis. Neurological: Negative for headaches, focal weakness or numbness.   ____________________________________________   PHYSICAL EXAM:  VITAL SIGNS: ED Triage Vitals  Enc Vitals Group     BP 08/24/18 2123 127/66     Pulse Rate 08/24/18 2122 77     Resp 08/24/18 2122 16     Temp 08/24/18 2122 99.3 F (37.4 C)     Temp Source 08/24/18 2122 Oral      SpO2 08/24/18 2122 99 %     Weight 08/24/18 2122 130 lb (59 kg)     Height 08/24/18 2122  (1.575 m)     Head Circumference --      Peak Flow --      Pain Score 08/24/18 2122 10     Pain Loc --      Pain Edu? --      Excl. in GC? --      Constitutional: Alert and oriented. Well appearing and in no acute distress. Eyes: Conjunctivae are normal. PERRL. EOMI. Head: Atraumatic. ENT:      Ears: TMs are pearly.      Nose: No congestion/rhinnorhea.      Mouth/Throat: Mucous membranes are moist.  Posterior pharynx is erythematous with tonsillar exudate. Neck: No stridor.  No cervical spine tenderness to palpation. Hematological/Lymphatic/Immunilogical: Palpable cervical lymphadenopathy. Cardiovascular: Normal rate, regular rhythm. Normal S1 and S2.  Good peripheral circulation. Respiratory: Normal respiratory effort without tachypnea or retractions. Lungs CTAB. Good air entry to the bases with no decreased or absent breath sounds. Gastrointestinal: Bowel sounds 4 quadrants. Soft and nontender to palpation. No guarding or rigidity. No palpable masses. No distention. No CVA tenderness. Musculoskeletal: Full range of motion to all extremities. No gross deformities appreciated. Neurologic:  Normal speech and language. No gross focal neurologic deficits are appreciated.  Skin:  Skin is warm, dry and intact. No rash noted. Psychiatric: Mood and affect are normal. Speech and behavior are normal. Patient exhibits appropriate insight and judgement.   ____________________________________________   LABS (all labs ordered are listed, but only abnormal results are displayed)  Labs Reviewed  GROUP A STREP BY PCR   ____________________________________________  EKG   ____________________________________________  RADIOLOGY  No results found.  ____________________________________________    PROCEDURES  Procedure(s) performed:    Procedures    Medications - No data to  display   ____________________________________________   INITIAL IMPRESSION / ASSESSMENT AND PLAN / ED COURSE  Pertinent labs & imaging results that were available during my care of the patient were reviewed by me and considered in my medical decision making (see chart for details).  Review of the [place] CSRS was performed in accordance of the [facility] prior to dispensing any controlled drugs.      Assessment and plan Strep throat Patient presents to the emergency department with fever, pharyngitis and mild headache.  History and physical exam findings are consistent with group A strep pharyngitis.  Patient was discharged with amoxicillin.  Strict return precautions were given to return to the emergency department for new or worsening symptoms.  All patient questions were answered.    ____________________________________________  FINAL CLINICAL IMPRESSION(S) / ED DIAGNOSES  Final diagnoses:  Strep throat      NEW MEDICATIONS STARTED DURING THIS VISIT:  ED Discharge Orders         Ordered    amoxicillin (AMOXIL) 875 MG tablet  2 times daily     08/24/18 2240  This chart was dictated using voice recognition software/Dragon. Despite best efforts to proofread, errors can occur which can change the meaning. Any change was purely unintentional.    Orvil Feil, PA-C 08/24/18 2309    Minna Antis, MD 08/25/18 [phone]

## 2019-03-21 ENCOUNTER — Other Ambulatory Visit: Payer: Self-pay

## 2019-03-21 ENCOUNTER — Ambulatory Visit: Payer: Self-pay | Admitting: Physician Assistant

## 2019-03-21 DIAGNOSIS — Z3201 Encounter for pregnancy test, result positive: Secondary | ICD-10-CM

## 2019-03-21 DIAGNOSIS — Z113 Encounter for screening for infections with a predominantly sexual mode of transmission: Secondary | ICD-10-CM

## 2019-03-21 LAB — WET PREP FOR TRICH, YEAST, CLUE
Trichomonas Exam: NEGATIVE
Yeast Exam: NEGATIVE

## 2019-03-21 LAB — PREGNANCY, URINE: Preg Test, Ur: POSITIVE — AB

## 2019-03-21 MED ORDER — PRENATAL VITAMIN 27-0.8 MG PO TABS: 1.0000 | ORAL_TABLET | Freq: Every day | ORAL | 0 refills | Status: AC

## 2019-03-22 ENCOUNTER — Encounter: Payer: Self-pay | Admitting: Physician Assistant

## 2019-03-22 NOTE — Progress Notes (Signed)
STI clinic/screening visit  Subjective:  Carrie [name] is a 23 y.o. female being seen today for an STI screening visit. The patient reports they do not have symptoms.  Patient has the following medical conditions:   Patient Active Problem List   Diagnosis Date Noted  . Perirectal abscess      Chief Complaint  Patient presents with  . SEXUALLY TRANSMITTED DISEASE    HPI  Patient reports that she is not having any symptoms but wants a screening due to hearing rumors about her partner.  States that partner denies and symptoms and any infection.  LMP 02/12/19 and abnormal since it "stopped and started a couple times" and last normal period was 8/18/20020.  See flowsheet for further details and programmatic requirements.    The following portions of the patient's history were reviewed and updated as appropriate: allergies, current medications, past medical history, past social history, past surgical history and problem list.  Objective:  There were no vitals filed for this visit.  Physical Exam Constitutional:      General: She is not in acute distress.    Appearance: Normal appearance.  HENT:     Head: Normocephalic and atraumatic.     Mouth/Throat:     Mouth: Mucous membranes are moist.     Pharynx: Oropharynx is clear. No oropharyngeal exudate or posterior oropharyngeal erythema.  Eyes:     Conjunctiva/sclera: Conjunctivae normal.  Neck:     Musculoskeletal: Neck supple.  Pulmonary:     Effort: Pulmonary effort is normal.  Abdominal:     Palpations: Abdomen is soft. There is no mass.     Tenderness: There is no abdominal tenderness. There is no guarding or rebound.  Genitourinary:    General: Normal vulva.     Rectum: Normal.     Comments: External genitalia/pubic area without nits, lice, edema, erythema, lesions and inguinal adenopathy. Vagina with normal mucosa and discharge, pH=4.5. Cervix without visible lesions. Uterus firm, mobile, nt, no masses, no CMT,  no adnexal tenderness or fullness. Lymphadenopathy:     Cervical: No cervical adenopathy.  Skin:    General: Skin is warm and dry.     Findings: No bruising, erythema, lesion or rash.  Neurological:     Mental Status: She is alert and oriented to person, place, and time.  Psychiatric:        Mood and Affect: Mood normal.        Behavior: Behavior normal.        Thought Content: Thought content normal.        Judgment: Judgment normal.       Assessment and Plan:  Carrie [name] is a 23 y.o. female presenting to the [facility] Department for STI screening  1. Screening for STD (sexually transmitted disease) Patient into clinic without symptoms.  Rec condoms with all sex. Await test results.  Counseled that RN will call if needs to RTC for treatment once results are back. - Pregnancy, urine - WET PREP FOR TRICH, YEAST, CLUE - Gonococcus culture - Chlamydia/Gonorrhea [facility] - HIV/HCV [facility] - Syphilis Serology, [facility]  2. Pregnancy test-positive Counseled patient re:  Healthy habits for self and partner such as regular sleep, vitamins daily, decreasing/stopping all smoking, limiting caffeine, etc for healthy pregnancy. Counseled patient re: signs of ectopic and when to go to ER with irregular bleeding/abdominial pain. Gave patient proof of pregnancy form and [facility] resource sheet. Counseled that patient should  get in with [facility] ASAP; per last normal period, she is 9w 3d EGA today, but by last period she is 5w 3d today. - Prenatal Vit-Fe Fumarate-FA (PRENATAL VITAMIN) 27-0.8 MG TABS; Take 1 tablet by mouth daily.  Dispense: 100 tablet; Refill: 0     No follow-ups on file.  No future appointments.  Matt Holmes, PA

## 2019-03-26 LAB — GONOCOCCUS CULTURE

## 2019-03-28 ENCOUNTER — Ambulatory Visit (INDEPENDENT_AMBULATORY_CARE_PROVIDER_SITE_OTHER): Payer: Self-pay | Admitting: Advanced Practice Midwife

## 2019-03-28 ENCOUNTER — Encounter: Payer: Self-pay | Admitting: Advanced Practice Midwife

## 2019-03-28 ENCOUNTER — Other Ambulatory Visit: Payer: Self-pay

## 2019-03-28 ENCOUNTER — Other Ambulatory Visit (HOSPITAL_COMMUNITY)
Admission: RE | Admit: 2019-03-28 | Discharge: 2019-03-28 | Disposition: A | Discharge status: Home / Self Care | Payer: Medicaid Other | Source: Ambulatory Visit | Attending: Advanced Practice Midwife | Admitting: Advanced Practice Midwife

## 2019-03-28 VITALS — BP 116/68 | HR 80 | Wt 136.0 lb

## 2019-03-28 DIAGNOSIS — Z124 Encounter for screening for malignant neoplasm of cervix: Secondary | ICD-10-CM | POA: Insufficient documentation

## 2019-03-28 DIAGNOSIS — Z3400 Encounter for supervision of normal first pregnancy, unspecified trimester: Secondary | ICD-10-CM | POA: Insufficient documentation

## 2019-03-28 DIAGNOSIS — Z3A01 Less than 8 weeks gestation of pregnancy: Secondary | ICD-10-CM

## 2019-03-28 DIAGNOSIS — Z113 Encounter for screening for infections with a predominantly sexual mode of transmission: Secondary | ICD-10-CM | POA: Diagnosis present

## 2019-03-28 DIAGNOSIS — Z3401 Encounter for supervision of normal first pregnancy, first trimester: Secondary | ICD-10-CM

## 2019-03-28 NOTE — Patient Instructions (Addendum)
Morning Sickness ° °Morning sickness is when a woman feels nauseous during pregnancy. This nauseous feeling may or may not come with vomiting. It often occurs in the morning, but it can be a problem at any time of day. Morning sickness is most common during the first trimester. In some cases, it may continue throughout pregnancy. Although morning sickness is unpleasant, it is usually harmless unless the woman develops severe and continual vomiting (hyperemesis gravidarum), a condition that requires more intense treatment. °What are the causes? °The exact cause of this condition is not known, but it seems to be related to normal hormonal changes that occur in pregnancy. °What increases the risk? °You are more likely to develop this condition if: °· You experienced nausea or vomiting before your pregnancy. °· You had morning sickness during a previous pregnancy. °· You are pregnant with more than one baby, such as twins. °What are the signs or symptoms? °Symptoms of this condition include: °· Nausea. °· Vomiting. °How is this diagnosed? °This condition is usually diagnosed based on your signs and symptoms. °How is this treated? °In many cases, treatment is not needed for this condition. Making some changes to what you eat may help to control symptoms. Your health care provider may also prescribe or recommend: °· Vitamin B6 supplements. °· Anti-nausea medicines. °· Ginger. °Follow these instructions at home: °Medicines °· Take over-the-counter and prescription medicines only as told by your health care provider. Do not use any prescription, over-the-counter, or herbal medicines for morning sickness without first talking with your health care provider. °· Taking multivitamins before getting pregnant can prevent or decrease the severity of morning sickness in most women. °Eating and drinking °· Eat a piece of dry toast or crackers before getting out of bed in the morning. °· Eat 5 or 6 small meals a day. °· Eat dry and  bland foods, such as rice or a baked potato. Foods that are high in carbohydrates are often helpful. °· Avoid greasy, fatty, and spicy foods. °· Have someone cook for you if the smell of any food causes nausea and vomiting. °· If you feel nauseous after taking prenatal vitamins, take the vitamins at night or with a snack. °· Snack on protein foods between meals if you are hungry. Nuts, yogurt, and cheese are good options. °· Drink fluids throughout the day. °· Try ginger ale made with real ginger, ginger tea made from fresh grated ginger, or ginger candies. °General instructions °· Do not use any products that contain nicotine or tobacco, such as cigarettes and e-cigarettes. If you need help quitting, ask your health care provider. °· Get an air purifier to keep the air in your house free of odors. °· Get plenty of fresh air. °· Try to avoid odors that trigger your nausea. °· Consider trying these methods to help relieve symptoms: °? Wearing an acupressure wristband. These wristbands are often worn for seasickness. °? Acupuncture. °Contact a health care provider if: °· Your home remedies are not working and you need medicine. °· You feel dizzy or light-headed. °· You are losing weight. °Get help right away if: °· You have persistent and uncontrolled nausea and vomiting. °· You faint. °· You have severe pain in your abdomen. °Summary °· Morning sickness is when a woman feels nauseous during pregnancy. This nauseous feeling may or may not come with vomiting. °· Morning sickness is most common during the first trimester. °· It often occurs in the morning, but it can be a problem at   any time of day. °· In many cases, treatment is not needed for this condition. Making some changes to what you eat may help to control symptoms. °This information is not intended to replace advice given to you by your health care provider. Make sure you discuss any questions you have with your health care provider. °Document Released:  07/07/2006 Document Revised: 04/28/2017 Document Reviewed: 06/18/2016 °Elsevier Patient Education © 2020 [employer] Inc. ° °

## 2019-03-28 NOTE — Progress Notes (Signed)
New Obstetric Patient H&P    Chief Complaint: "Desires prenatal care"   History of Present Illness: Patient is a 23 y.o. G1P0000 Not Hispanic or Latino female, presents with amenorrhea and positive home pregnancy test. Patient's last menstrual period was 02/12/2019 (exact date). and based on her  LMP, her EDD is Estimated Date of Delivery: 11/19/19 and her EGA is [redacted]w[redacted]d. Cycles are 5-7. days, regular, and occur approximately every : 28 days. First PAP smear today.   She had a urine pregnancy test which was positive 1 week(s)  ago. Her last menstrual period was normal and lasted for  5 or 6 day(s). Since her LMP she claims she has experienced fatigue, nausea, vomiting. She denies vaginal bleeding. Her past medical history is noncontributory. This is her first pregnancy.  Since her LMP, she admits to the use of tobacco products:  Quit with positive pregnancy test She claims she has lost 6 or 7 pounds pounds since the start of her pregnancy.  There are cats in the home in the home  no  She admits close contact with children on a regular basis  no  She has had chicken pox in the past no She has had Tuberculosis exposures, symptoms, or previously tested positive for TB   no Current or past history of domestic violence. no  Genetic Screening/Teratology Counseling: (Includes patient, baby's father, or anyone in either family with:)   1. Patient's age >/= 32 at [facility]  no 2. Thalassemia ([place], [place], Mediterranean, or Asian background): MCV<80  no 3. Neural tube defect (meningomyelocele, spina bifida, anencephaly)  no 4. Congenital heart defect  no  5. Down syndrome  no 6. Tay-Sachs (Jewish, [place]  no 7. Canavan's Disease  no 8. Sickle cell disease or trait (African)  no  9. Hemophilia or other blood disorders  no  10. Muscular dystrophy  no  11. Cystic fibrosis  no  12. Huntington's Chorea  no  13. Mental retardation/autism  no 14. Other inherited genetic or chromosomal disorder   no 15. Maternal metabolic disorder (DM, PKU, etc)  no 16. Patient or FOB with a child with a birth defect not listed above no  16a. Patient or FOB with a birth defect themselves no 17. Recurrent pregnancy loss, or stillbirth  no  18. Any medications since LMP other than prenatal vitamins (include vitamins, supplements, OTC meds, drugs, alcohol)  no 19. Any other genetic/environmental exposure to discuss  no  Infection History:   1. Lives with someone with TB or TB exposed  no  2. Patient or partner has history of genital herpes  no 3. Rash or viral illness since LMP  no 4. History of STI (GC, CT, HPV, syphilis, HIV)  no 5. History of recent travel :  no  Other pertinent information:  no     Review of Systems:10 point review of systems negative unless otherwise noted in HPI  Past Medical History:  Past Medical History:  Diagnosis Date  . Headache     Past Surgical History:  Past Surgical History:  Procedure Laterality Date  . INCISION AND DRAINAGE PERIRECTAL ABSCESS N/A 01/21/2016   Procedure: IRRIGATION AND DEBRIDEMENT PERIRECTAL ABSCESS;  Surgeon: Leafy Ro, MD;  Location: [facility] ORS;  Service: General;  Laterality: N/A;    Gynecologic History: Patient's last menstrual period was 02/12/2019 (exact date).  Obstetric History: G1P0000  Family History:  Family History  Problem Relation Age of Onset  . Hypertension Mother   . Cancer Maternal  Aunt   . Heart disease Paternal Grandmother     Social History:  Social History   Socioeconomic History  . Marital status: Single    Spouse name: Not on file  . Number of children: Not on file  . Years of education: Not on file  . Highest education level: Not on file  Occupational History  . Occupation: Unemployed  Social Needs  . Financial resource strain: Not on file  . Food insecurity    Worry: Not on file    Inability: Not on file  . Transportation needs    Medical: Not on file    Non-medical: Not on file   Tobacco Use  . Smoking status: Former Smoker    Packs/day: 0.25    Types: Cigarettes  . Smokeless tobacco: Never Used  Substance and Sexual Activity  . Alcohol use: No  . Drug use: Yes    Types: Marijuana  . Sexual activity: Yes    Birth control/protection: None  Lifestyle  . Physical activity    Days per week: Not on file    Minutes per session: Not on file  . Stress: Not on file  Relationships  . Social Musicianconnections    Talks on phone: Not on file    Gets together: Not on file    Attends religious service: Not on file    Active member of club or organization: Not on file    Attends meetings of clubs or organizations: Not on file    Relationship status: Not on file  . Intimate partner violence    Fear of current or ex partner: Not on file    Emotionally abused: Not on file    Physically abused: Not on file    Forced sexual activity: Not on file  Other Topics Concern  . Not on file  Social History Narrative  . Not on file    Allergies:  No Known Allergies  Medications: Prior to Admission medications   Medication Sig Start Date End Date Taking? Authorizing Provider  polyethylene glycol powder (GLYCOLAX/MIRALAX) powder MIX 2 CAPFULS IN 8 OUNCES OF WATER AND DRINK ON DAY 1 AND DAY2 THEN TAKE AS NEEDED 01/19/16   [provider]  Prenatal Vit-Fe Fumarate-FA (PRENATAL VITAMIN) 27-0.8 MG TABS Take 1 tablet by mouth daily. 03/21/19 06/29/19  Matt HolmesHampton, Carla J, PA    Physical Exam Vitals: Blood pressure 116/68, pulse 80, weight 136 lb (61.7 kg), last menstrual period 02/12/2019.  General: NAD HEENT: normocephalic, anicteric Thyroid: no enlargement, no palpable nodules Pulmonary: No increased work of breathing, CTAB Cardiovascular: RRR, distal pulses 2+ Abdomen: NABS, soft, non-tender, non-distended.  Umbilicus without lesions.  No hepatomegaly, splenomegaly or masses palpable. No evidence of hernia  Genitourinary:  External: Normal external female genitalia.   Normal urethral meatus, normal Bartholin's and Skene's glands.    Vagina: Normal vaginal mucosa, no evidence of prolapse.    Cervix: Grossly normal in appearance, no bleeding, no CMT  Uterus:  Non-enlarged, mobile, normal contour.    Adnexa: ovaries non-enlarged, no adnexal masses  Rectal: deferred Extremities: no edema, erythema, or tenderness Neurologic: Grossly intact Psychiatric: mood appropriate, affect full   Assessment: 23 y.o. G1P0000 at 1022w2d by exact LMP presenting to initiate prenatal care  Plan: 1) Avoid alcoholic beverages. 2) Patient encouraged not to smoke.  3) Discontinue the use of all non-medicinal drugs and chemicals.  4) Take prenatal vitamins daily.  5) Nutrition, food safety (fish, cheese advisories, and high nitrite foods) and exercise discussed. 6)  Hospital and practice style discussed with cross coverage system.  7) Genetic Screening, such as with 1st Trimester Screening, cell free fetal DNA, AFP testing, and Ultrasound, as well as with amniocentesis and CVS as appropriate, is discussed with patient. At the conclusion of today's visit patient undecided regarding genetic testing 8) Patient is asked about travel to areas at risk for the Zika virus, and counseled to avoid travel and exposure to mosquitoes or sexual partners who may have themselves been exposed to the virus. Testing is discussed, and will be ordered as appropriate.  9) PAPtima, urine culture today 10) Return in 1 week for dating scan and rob 11) NOB panel, sickle cell screen and MaterniT21 (if desires) at 10+ wks   Rod Can, [place] 03/28/2019, 5:00 PM

## 2019-03-28 NOTE — Progress Notes (Signed)
NOB 

## 2019-03-30 LAB — URINE CULTURE: Organism ID, Bacteria: NO GROWTH

## 2019-04-01 ENCOUNTER — Telehealth: Payer: Self-pay

## 2019-04-01 NOTE — Telephone Encounter (Signed)
TC to patient. Verified ID via password/SS#. Informed of positive chamydia and need for tx. Instructed to eat before visit and have partner call for tx appt. Appt scheduled. Aileen Fass, RN

## 2019-04-01 NOTE — Telephone Encounter (Signed)
Lab sent for scanning. Aracelia Brinson, RN  

## 2019-04-02 ENCOUNTER — Ambulatory Visit: Payer: Medicaid Other

## 2019-04-02 ENCOUNTER — Other Ambulatory Visit: Payer: Self-pay

## 2019-04-02 DIAGNOSIS — A749 Chlamydial infection, unspecified: Secondary | ICD-10-CM

## 2019-04-02 MED ORDER — AZITHROMYCIN 500 MG PO TABS
1000.0000 mg | ORAL_TABLET | Freq: Once | ORAL | Status: AC
  Administered 2019-04-02: 1000 mg via ORAL

## 2019-04-04 ENCOUNTER — Other Ambulatory Visit: Payer: Self-pay

## 2019-04-04 ENCOUNTER — Encounter: Payer: Medicaid Other | Admitting: Advanced Practice Midwife

## 2019-04-04 ENCOUNTER — Ambulatory Visit (INDEPENDENT_AMBULATORY_CARE_PROVIDER_SITE_OTHER): Payer: Medicaid Other

## 2019-04-04 DIAGNOSIS — O3481 Maternal care for other abnormalities of pelvic organs, first trimester: Secondary | ICD-10-CM

## 2019-04-04 DIAGNOSIS — N8311 Corpus luteum cyst of right ovary: Secondary | ICD-10-CM

## 2019-04-04 DIAGNOSIS — Z3400 Encounter for supervision of normal first pregnancy, unspecified trimester: Secondary | ICD-10-CM

## 2019-04-04 DIAGNOSIS — Z3A01 Less than 8 weeks gestation of pregnancy: Secondary | ICD-10-CM | POA: Diagnosis not present

## 2019-04-04 LAB — CYTOLOGY - PAP
Chlamydia: NEGATIVE
Comment: NEGATIVE
Comment: NEGATIVE
Comment: NORMAL
Diagnosis: NEGATIVE
Neisseria Gonorrhea: NEGATIVE
Trichomonas: NEGATIVE

## 2019-04-05 ENCOUNTER — Encounter: Payer: Self-pay | Admitting: Obstetrics & Gynecology

## 2019-04-05 ENCOUNTER — Ambulatory Visit (INDEPENDENT_AMBULATORY_CARE_PROVIDER_SITE_OTHER): Payer: Medicaid Other | Admitting: Obstetrics & Gynecology

## 2019-04-05 VITALS — BP 120/80 | Wt 136.0 lb

## 2019-04-05 DIAGNOSIS — Z3400 Encounter for supervision of normal first pregnancy, unspecified trimester: Secondary | ICD-10-CM

## 2019-04-05 DIAGNOSIS — Z3A01 Less than 8 weeks gestation of pregnancy: Secondary | ICD-10-CM

## 2019-04-05 DIAGNOSIS — Z3401 Encounter for supervision of normal first pregnancy, first trimester: Secondary | ICD-10-CM

## 2019-04-05 LAB — POCT URINALYSIS DIPSTICK OB
Glucose, UA: NEGATIVE
POC,PROTEIN,UA: NEGATIVE

## 2019-04-05 NOTE — Progress Notes (Addendum)
  HPI: Early pregnancy, min nausea, no pain or bleeding  Ultrasound demonstrates early normal IUP, good FHT  PMHx: She  has a past medical history of Headache. Also,  has a past surgical history that includes Incision and drainage perirectal abscess (N/A, 01/21/2016)., family history includes Cancer in her maternal aunt; Heart disease in her paternal grandmother; Hypertension in her mother.,  reports that she has quit smoking. Her smoking use included cigarettes. She smoked 0.25 packs per day. She has never used smokeless tobacco. She reports current drug use. Drug: Marijuana. She reports that she does not drink alcohol.  She has a current medication list which includes the following prescription(s): polyethylene glycol powder and prenatal vitamin. Also, has No Known Allergies.  Review of Systems  All other systems reviewed and are negative.   Objective: BP 120/80   Wt 136 lb (61.7 kg)   LMP 02/12/2019 (Exact Date)   BMI 24.87 kg/m   Physical examination Constitutional NAD, Conversant  Skin No rashes, lesions or ulceration.   Extremities: Moves all appropriately.  Normal ROM for age. No lymphadenopathy.  Neuro: Grossly intact  Psych: Oriented to PPT.  Normal mood. Normal affect.   US Ob Comp Less 14 Wks  Result Date: 04/05/2019 Patient Name: Carrie [name] DOB: 04-May-1996 MRN: [id number] ULTRASOUND REPORT Location: [facility] Date of Service: 04/04/2019 Indications:dating Findings: Nelda [place]se[place] intrauterine pregnancy is visualized with a CRL consistent with [redacted]w[redacted]d gestation, giving an (U/S) EDD of 11/16/2019. The (U/S) EDD is consistent with the clinically established EDD of 11/19/2019. FHR: 154 BPM CRL measurement: 14.1 mm Yolk sac is visualized and appears normal and early anatomy is normal. Amnion: visualized and appears normal Right Ovary is normal in appearance. Left Ovary is normal appearance. Corpus luteal cyst:  Right ovary Survey of the adnexa demonstrates no adnexal masses. There  is no free peritoneal fluid in the cul de sac. Impression: 1. [redacted]w[redacted]d Viable Singleton Intrauterine pregnancy by U/S. 2. (U/S) EDD is consistent with Clinically established EDD of 11/19/2019. Recommendations: 1.Clinical correlation with the patient's History and Physical Exam. [name], RT Review of ULTRASOUND.    I have personally reviewed images and report of recent ultrasound done at [facility].    Plan of management to be discussed with patient. [name], MD, [facility], [facility] 04/05/2019  9:13 AM    Assessment:  Supervision of normal first pregnancy, antepartum  [redacted] weeks gestation of pregnancy  Plan labs nv for PNL, NIPT Min nausea  Clinic [facility]- do nv  Dating [place] 7 wks Blood type:     Genetic Screen NIPS: Antibody:   Anatomic [place]  Rubella:   Varicella:   GTT 28 week:  RPR:     Rhogam  HBsAg:     Vaccines TDAP:                       Flu Shot: HIV:     Baby Food Breast                               GBS:   Contraception  Pap: 03/28/19  CBB  No   CS/VBAC n/a   Support Person Partner Luz Lex, MD, [facility], [facility] 04/05/2019  9:14 AM

## 2019-04-05 NOTE — Addendum Note (Signed)
Addended by: Quintella Baton D on: 04/05/2019 09:22 AM   Modules accepted: Orders

## 2019-04-05 NOTE — Patient Instructions (Signed)

## 2019-04-29 ENCOUNTER — Encounter: Payer: Self-pay | Admitting: Obstetrics and Gynecology

## 2019-04-29 ENCOUNTER — Other Ambulatory Visit: Payer: Self-pay

## 2019-04-29 ENCOUNTER — Ambulatory Visit (INDEPENDENT_AMBULATORY_CARE_PROVIDER_SITE_OTHER): Payer: Medicaid Other | Admitting: Obstetrics and Gynecology

## 2019-04-29 VITALS — BP 102/50 | Wt 136.0 lb

## 2019-04-29 DIAGNOSIS — Z3A10 10 weeks gestation of pregnancy: Secondary | ICD-10-CM

## 2019-04-29 DIAGNOSIS — Z3400 Encounter for supervision of normal first pregnancy, unspecified trimester: Secondary | ICD-10-CM

## 2019-04-29 DIAGNOSIS — O210 Mild hyperemesis gravidarum: Secondary | ICD-10-CM

## 2019-04-29 DIAGNOSIS — Z1379 Encounter for other screening for genetic and chromosomal anomalies: Secondary | ICD-10-CM

## 2019-04-29 MED ORDER — PROCHLORPERAZINE MALEATE 10 MG PO TABS: 10.0000 mg | ORAL_TABLET | Freq: Four times a day (QID) | ORAL | 3 refills | Status: DC | PRN

## 2019-04-29 NOTE — Progress Notes (Signed)
    Routine Prenatal Care Visit  Subjective  Farmers is a 23 y.o. G1P0000 at [redacted]w[redacted]d being seen today for ongoing prenatal care.  She is currently monitored for the following issues for this low-risk pregnancy and has Perirectal abscess and Supervision of normal first pregnancy, antepartum on their problem list.  ----------------------------------------------------------------------------------- Patient reports vomiting usually once a day in the morning. Is keeping down food and water..   Contractions: Not present. Vag. Bleeding: None.  Movement: Absent. Denies leaking of fluid.  ----------------------------------------------------------------------------------- The following portions of the patient's history were reviewed and updated as appropriate: allergies, current medications, past family history, past medical history, past social history, past surgical history and problem list. Problem list updated.   Objective  Blood pressure (!) 102/50, weight 136 lb (61.7 kg), last menstrual period 02/12/2019. Pregravid weight 136 lb (61.7 kg) Total Weight Gain 0 lb (0 kg) Urinalysis:      Fetal Status: Fetal Heart Rate (bpm): 165   Movement: Absent     General:  Alert, oriented and cooperative. Patient is in no acute distress.  Skin: Skin is warm and dry. No rash noted.   Cardiovascular: Normal heart rate noted  Respiratory: Normal respiratory effort, no problems with respiration noted  Abdomen: Soft, gravid, appropriate for gestational age. Pain/Pressure: Absent     Pelvic:  Cervical exam deferred        Extremities: Normal range of motion.  Edema: None  Mental Status: Normal mood and affect. Normal behavior. Normal judgment and thought content.     Assessment   23 y.o. G1P0000 at [redacted]w[redacted]d by  11/19/2019, by Last Menstrual Period presenting for routine prenatal visit  Plan     Gestational age appropriate obstetric precautions including but not limited to vaginal bleeding,  contractions, leaking of fluid and fetal movement were reviewed in detail with the patient.    Compazine for Nausea- continue B6 and unisom- follow up in 2 weeks.  Maternit21 test today. Would like gender envelope  Return in about 2 weeks (around 05/13/2019) for [place] telephone visit.  Homero Fellers MD [facility], [facility] 04/29/2019, 9:38 AM

## 2019-04-29 NOTE — Patient Instructions (Signed)
Initial steps to help :   B6 (pyridoxine) 25 mg,  3-4 times a day Unisom (doxylamine) 25 mg at bedtime **B6 and Unisom are available as a combination prescription medications called diclegis and bonjesta  B1 (thiamin)  50-100 mg 1-4 a day  Continue prenatal vitamin with iron and thiamin. If it is not tolerated switch to 1 mg of folic acid.  Can add medication for gastric reflux if needed.  Subsequent steps to be added to B1, B6, and Unisom:  1. Antihistamine (one of the following medications) Dramamine      25-50 mg every 4-6 hours Benadryl      25-50 mg every 4-6 hours Meclizine      25 mg every 6 hours  2. Dopamine Antagonist (one of the following medications) Metoclopramide  (Reglan)  5-10 mg every 6-8 hours         PO Promethazine   (Phenergan)   12.5-25 mg every 4-6 hours      PO or rectal Prochlorperazine  (Compazine)  5-10 mg every 6-8 hours     25mg BID rectally    Subsequent steps if there has still not been improvement in symptoms:  3. Daily stool softner  4. Ondansetron  (Zofran)   4-8 mg every 6-8 hours   

## 2019-04-29 NOTE — Progress Notes (Signed)
ROB C/o nausea/vomiting, and headaches Declines Flu  Denies lof, no vb or cramping

## 2019-05-03 LAB — MATERNIT21 PLUS CORE+SCA
Fetal Fraction: 9
Monosomy X (Turner Syndrome): NOT DETECTED
Result (T21): NEGATIVE
Trisomy 13 (Patau syndrome): NEGATIVE
Trisomy 18 (Edwards syndrome): NEGATIVE
Trisomy 21 (Down syndrome): NEGATIVE
XXX (Triple X Syndrome): NOT DETECTED
XXY (Klinefelter Syndrome): NOT DETECTED
XYY (Jacobs Syndrome): NOT DETECTED

## 2019-05-07 NOTE — Progress Notes (Signed)
Could you please make this patient a gender envelope- having a boy- reviewed otherwise normal result with patient on the phone.  Thank you

## 2019-05-13 ENCOUNTER — Other Ambulatory Visit: Payer: Self-pay

## 2019-05-13 ENCOUNTER — Telehealth: Payer: Medicaid Other | Admitting: Certified Nurse Midwife

## 2019-05-13 DIAGNOSIS — Z3A12 12 weeks gestation of pregnancy: Secondary | ICD-10-CM

## 2019-05-13 DIAGNOSIS — Z3400 Encounter for supervision of normal first pregnancy, unspecified trimester: Secondary | ICD-10-CM

## 2019-05-13 MED ORDER — ONDANSETRON 4 MG PO TBDP: 4.0000 mg | ORAL_TABLET | Freq: Four times a day (QID) | ORAL | 1 refills | Status: DC | PRN

## 2019-05-13 NOTE — Progress Notes (Unsigned)
C/o no appetite.rj °

## 2019-05-19 NOTE — Progress Notes (Unsigned)
Routine Prenatal Care Visit- Virtual Visit  Subjective   Virtual Visit via Telephone Note  I connected with Carrie [name] on 05/13/2019 at 10:30 AM EST by telephone and verified that I am speaking with the correct person using two identifiers.   I discussed the limitations, risks, security and privacy concerns of performing an evaluation and management service by telephone and the availability of in person appointments. I also discussed with the patient that there may be a patient responsible charge related to this service. The patient expressed understanding and agreed to proceed.  The patient was at home I spoke with the patient from my  workstation The names of people involved in this encounter were: [place] Sink , and [place] , and [place] .   Carrie [name] is a 23 y.o. G1P0000 at 12wk6d being seen today for ongoing prenatal care.  She is currently monitored for the following issues for this low-risk pregnancy and has Perirectal abscess and Supervision of normal first pregnancy, antepartum on their problem list.  ----------------------------------------------------------------------------------- Patient reports decreased apetite. .    .  .   . Denies leaking of fluid, vaginal bleeding. Had cell free DNA test which was negative, but NOB labs not done. Has not heard FHTS with DT in office ----------------------------------------------------------------------------------- The following portions of the patient's history were reviewed and updated as appropriate: allergies, current medications, past family history, past medical history, past social history, past surgical history and problem list. Problem list updated.   Objective  Last menstrual period 02/12/2019. Pregravid weight 136 lb (61.7 kg) Total Weight Gain 0 lb (0 kg) Urinalysis:no specimen Fetal Status:  Physical Exam could not be performed. Because of the COVID-19 outbreak this visit was performed over the phone and not in person.    Assessment   23 y.o. G1P0000 at [redacted]w[redacted]d by  11/19/2019, by Last Menstrual Period presenting for routine prenatal visit  Plan   Pregnancy#1 Problems (from 02/12/19 to present)    Problem Noted Resolved   Supervision of normal first pregnancy, antepartum 03/28/2019 by Tresea Mall, CNM No   Overview Addendum 04/05/2019  9:20 AM by Nadara Mustard, MD    [facility]inic [facility]  Dating [place] 7 wks Blood type:     Genetic Screen NIPS: diploid XY Antibody:   Anatomic [place]  Rubella:   Varicella: @VZVIGG @  GTT 28 week:  RPR:     Rhogam  HBsAg:     Vaccines TDAP:                       Flu Shot: HIV:     Baby Food Breast                               GBS:   Contraception  Pap: 03/28/19  CBB  No   CS/VBAC n/a   Support Person Partner 03/30/19              Gestational age appropriate obstetric precautions including but not limited to vaginal bleeding, contractions, leaking of fluid and fetal movement were reviewed in detail with the patient.     Follow Up Instructions: ROB next week. Will get NOB labs at that time.   I discussed the assessment and treatment plan with the patient. The patient was provided an opportunity to ask questions and all were answered. The patient agreed with the plan and demonstrated an understanding of the instructions.   The  patient was advised to call back or seek an in-person evaluation if the symptoms worsen or if the condition fails to improve as anticipated.  I provided 10 minutes of non-face-to-face time during this encounter.  Dalia Heading, CNM [facility], [facility]

## 2019-05-20 ENCOUNTER — Other Ambulatory Visit: Payer: Self-pay

## 2019-05-20 ENCOUNTER — Other Ambulatory Visit: Payer: Self-pay | Admitting: Advanced Practice Midwife

## 2019-05-20 ENCOUNTER — Ambulatory Visit (INDEPENDENT_AMBULATORY_CARE_PROVIDER_SITE_OTHER): Payer: Medicaid Other | Admitting: Certified Nurse Midwife

## 2019-05-20 VITALS — BP 90/60 | Temp 96.3°F | Wt 138.0 lb

## 2019-05-20 DIAGNOSIS — Z3A13 13 weeks gestation of pregnancy: Secondary | ICD-10-CM

## 2019-05-20 DIAGNOSIS — Z3401 Encounter for supervision of normal first pregnancy, first trimester: Secondary | ICD-10-CM

## 2019-05-20 DIAGNOSIS — Z3400 Encounter for supervision of normal first pregnancy, unspecified trimester: Secondary | ICD-10-CM

## 2019-05-20 LAB — POCT URINALYSIS DIPSTICK OB: Glucose, UA: NEGATIVE

## 2019-05-20 MED ORDER — FAMOTIDINE 20 MG PO TABS: 20.0000 mg | ORAL_TABLET | Freq: Two times a day (BID) | ORAL | 0 refills | Status: DC | PRN

## 2019-05-20 NOTE — Progress Notes (Signed)
C/o no appetite; thinks she has acid reflux b/c her vomit if a colorless foam.rj

## 2019-05-20 NOTE — Progress Notes (Signed)
ROB at 13wk6d: Doing better. Occasionally vomiting. ?acid reflux. Will try Pepcid FH 1/2 between the U and SP. FHTs 154 NOB labs and hemoglobinopathy panel today RTO in 3 weeks. MSAFP then if desires  Dalia Heading, CNM

## 2019-05-22 LAB — RPR+RH+ABO+RUB AB+AB SCR+CB...
Antibody Screen: NEGATIVE
HIV Screen 4th Generation wRfx: NONREACTIVE
Hematocrit: 34.9 % (ref 34.0–46.6)
Hemoglobin: 11.7 g/dL (ref 11.1–15.9)
Hepatitis B Surface Ag: NEGATIVE
MCH: 30 pg (ref 26.6–33.0)
MCHC: 33.5 g/dL (ref 31.5–35.7)
MCV: 90 fL (ref 79–97)
Platelets: 323 10*3/uL (ref 150–450)
RBC: 3.9 x10E6/uL (ref 3.77–5.28)
RDW: 12.1 % (ref 11.7–15.4)
RPR Ser Ql: NONREACTIVE
Rh Factor: POSITIVE
Rubella Antibodies, IGG: 1.24 index (ref >0.99)
Varicella zoster IgG: <135 index — ABNORMAL LOW (ref >165)
WBC: 10.5 10*3/uL (ref 3.4–10.8)

## 2019-05-22 LAB — HEMOGLOBINOPATHY EVALUATION
HGB C: 0 %
HGB S: 0 %
HGB VARIANT: 0 %
Hemoglobin A2 Quantitation: 2.5 % (ref 1.8–3.2)
Hemoglobin F Quantitation: 0 % (ref 0.0–2.0)
Hgb A: 97.5 % (ref 96.4–98.8)

## 2019-06-03 ENCOUNTER — Emergency Department
Admission: EM | Admit: 2019-06-03 | Discharge: 2019-06-03 | Disposition: A | Discharge status: Home / Self Care | Payer: Medicaid Other | Attending: Emergency Medicine | Admitting: Emergency Medicine

## 2019-06-03 ENCOUNTER — Other Ambulatory Visit: Payer: Self-pay

## 2019-06-03 DIAGNOSIS — R519 Headache, unspecified: Secondary | ICD-10-CM | POA: Insufficient documentation

## 2019-06-03 DIAGNOSIS — K5900 Constipation, unspecified: Secondary | ICD-10-CM | POA: Diagnosis present

## 2019-06-03 DIAGNOSIS — Z87891 Personal history of nicotine dependence: Secondary | ICD-10-CM | POA: Insufficient documentation

## 2019-06-03 DIAGNOSIS — Z79899 Other long term (current) drug therapy: Secondary | ICD-10-CM | POA: Insufficient documentation

## 2019-06-03 NOTE — Discharge Instructions (Addendum)
Follow-up with your doctor at [facility] if any continued problems.  Increase fluids and juices.  Obtain over-the-counter glycerin suppositories for adults and use as needed.  There is no medication in this suppository and will not interact with your pregnancy or other medicines.  This is to soften the stool that is present.  Also obtain some MiraLAX or generic over-the-counter and begin taking.  Today and tomorrow take a full capful and mix with water or juice.  Follow this with lots of fluids immediately behind this.  You may continue using this MiraLAX on a daily or every other day routine to prevent constipation in the future.  The prenatal vitamins with iron may be partially to blame for your constipation at this time.  Also increase vegetables and fruits in your diet which will help as well.

## 2019-06-03 NOTE — ED Provider Notes (Addendum)
[facility] Emergency Department Provider Note  ____________________________________________   First MD Initiated Contact with Patient 06/03/19 [phone]     (approximate)  I have reviewed the triage vital signs and the nursing notes.   HISTORY  Chief Complaint Constipation and Headache   HPI Carrie [name] is a 24 y.o. female presents to the ED with complaint of constipation for the last 3 to 4 days.  Patient states she has a history of constipation and currently she is taking prenatal vitamins with iron.  She denies any nausea or vomiting.  Patient is [redacted] weeks pregnant at this time.  She goes to [facility].       Past Medical History:  Diagnosis Date  . Headache     Patient Active Problem List   Diagnosis Date Noted  . Supervision of normal first pregnancy, antepartum 03/28/2019  . Perirectal abscess     Past Surgical History:  Procedure Laterality Date  . INCISION AND DRAINAGE PERIRECTAL ABSCESS N/A 01/21/2016   Procedure: IRRIGATION AND DEBRIDEMENT PERIRECTAL ABSCESS;  Surgeon: Leafy Ro, MD;  Location: [facility] ORS;  Service: General;  Laterality: N/A;    Prior to Admission medications   Medication Sig Start Date End Date Taking? Authorizing Provider  doxylamine, Sleep, (UNISOM) 25 MG tablet Take 25 mg by mouth 2 (two) times daily. 1/2 tab in am and whole tablet at HS.    [provider]  famotidine (PEPCID) 20 MG tablet Take 1 tablet (20 mg total) by mouth 2 (two) times daily as needed for heartburn or indigestion. 05/20/19   Farrel Conners, CNM  polyethylene glycol powder (GLYCOLAX/MIRALAX) powder MIX 2 CAPFULS IN 8 OUNCES OF WATER AND DRINK ON DAY 1 AND DAY2 THEN TAKE AS NEEDED 01/19/16   [provider]  Prenatal Vit-Fe Fumarate-FA (PRENATAL VITAMIN) 27-0.8 MG TABS Take 1 tablet by mouth daily. 03/21/19 06/29/19  Matt Holmes, PA  pyridOXINE (VITAMIN B-6) 50 MG tablet Take 50 mg by mouth daily.    [provider]    Allergies Patient has no known allergies.  Family History  Problem Relation Age of Onset  . Hypertension Mother   . Cancer Maternal Aunt   . Heart disease Paternal Grandmother     Social History Social History   Tobacco Use  . Smoking status: Former Smoker    Packs/day: 0.25    Types: Cigarettes  . Smokeless tobacco: Never Used  Substance Use Topics  . Alcohol use: No  . Drug use: Yes    Types: Marijuana    Review of Systems Constitutional: No fever/chills Cardiovascular: Denies chest pain. Respiratory: Denies shortness of breath. Gastrointestinal: No abdominal pain.  No nausea, no vomiting.  No diarrhea.  Positive constipation. Genitourinary: Negative for dysuria.  Positive for 15-week pregnancy. Musculoskeletal: Negative for back pain. Skin: Negative for rash. Neurological: Negative for headaches, focal weakness or numbness. ____________________________________________   PHYSICAL EXAM:  VITAL SIGNS: ED Triage Vitals [06/03/19 0635]  Enc Vitals Group     BP (!) 111/59     Pulse Rate 84     Resp 15     Temp 99.1 F (37.3 C)     Temp src      SpO2 98 %     Weight 138 lb 14.2 oz (63 kg)     Height      Head Circumference      Peak Flow      Pain Score 0     Pain  Loc      Pain Edu?      Excl. in [place]?    Constitutional: Alert and oriented. Well appearing and in no acute distress. Eyes: Conjunctivae are normal.  Head: Atraumatic. Neck: No stridor.   Cardiovascular: Normal rate, regular rhythm. Grossly normal heart sounds.  Good peripheral circulation. Respiratory: Normal respiratory effort.  No retractions. Lungs CTAB. Gastrointestinal: Soft and nontender. No distention.  Bowel sounds normoactive x4 quadrants. Musculoskeletal: Moves upper and lower extremities with any difficulty.  Normal gait was noted. Neurologic:  Normal speech and language. No gross focal neurologic deficits are appreciated. No gait instability. Skin:  Skin is warm,  dry and intact. No rash noted. Psychiatric: Mood and affect are normal. Speech and behavior are normal.  ____________________________________________   LABS (all labs ordered are listed, but only abnormal results are displayed)  Labs Reviewed - No data to display  PROCEDURES  Procedure(s) performed (including Critical Care):  Procedures   ____________________________________________   INITIAL IMPRESSION / ASSESSMENT AND PLAN / ED COURSE  As part of my medical decision making, I reviewed the following data within the electronic MEDICAL RECORD NUMBER Notes from prior ED visits and [place] was evaluated in Emergency Department on 06/03/2019 for the symptoms described in the history of present illness. She was evaluated in the context of the global COVID-19 pandemic, which necessitated consideration that the patient might be at risk for infection with the SARS-CoV-2 virus that causes COVID-19. Institutional protocols and algorithms that pertain to the evaluation of patients at risk for COVID-19 are in a state of rapid change based on information released by regulatory bodies including the CDC and federal and state organizations. These policies and algorithms were followed during the patient's care in the ED.  24 year old female presents to the ED with complaint of constipation.  Patient states it is been approximately 3 to 4 days since her last bowel movement.  She is currently [redacted] weeks pregnant and has been taking prenatal vitamins with iron.  She was unaware that there was any thing safe to take with constipation other than prune juice.  She denies any abdominal pain and on exam there is no tenderness to palpation bowel sounds are normoactive x4 quadrants.  Patient was encouraged to obtain over-the-counter glycerin suppositories and begin using MiraLAX daily and then cut down to one half cap per day or 1 full cap every other day mixed with water or juice.  She  is encouraged to drink lots of fluids and also increase her diet with fiber. ____________________________________________   FINAL CLINICAL IMPRESSION(S) / ED DIAGNOSES  Final diagnoses:  Constipation, unspecified constipation type     ED Discharge Orders    None       Note:  This document was prepared using Dragon voice recognition software and may include unintentional dictation errors.    Johnn Hai, PA-C 06/03/19 0817    Johnn Hai, PA-C 06/03/19 Harlin Rain    Arta Silence, MD 06/03/19 [phone]

## 2019-06-03 NOTE — ED Triage Notes (Signed)
Patient c/o constipation X 4 days, and intermittent headache. Patient reports hx of migraines. Patient reports headache is currently gone.

## 2019-06-10 ENCOUNTER — Ambulatory Visit (INDEPENDENT_AMBULATORY_CARE_PROVIDER_SITE_OTHER): Payer: Medicaid Other | Admitting: Obstetrics and Gynecology

## 2019-06-10 ENCOUNTER — Other Ambulatory Visit: Payer: Self-pay

## 2019-06-10 VITALS — BP 112/62 | Wt 141.0 lb

## 2019-06-10 DIAGNOSIS — Z3A16 16 weeks gestation of pregnancy: Secondary | ICD-10-CM

## 2019-06-10 DIAGNOSIS — Z363 Encounter for antenatal screening for malformations: Secondary | ICD-10-CM

## 2019-06-10 DIAGNOSIS — Z3400 Encounter for supervision of normal first pregnancy, unspecified trimester: Secondary | ICD-10-CM

## 2019-06-10 DIAGNOSIS — Z3402 Encounter for supervision of normal first pregnancy, second trimester: Secondary | ICD-10-CM

## 2019-06-10 LAB — POCT URINALYSIS DIPSTICK OB: Glucose, UA: NEGATIVE

## 2019-06-10 NOTE — Progress Notes (Signed)
ROB Hot flashes/weakness upon standing

## 2019-06-10 NOTE — Progress Notes (Signed)
    Routine Prenatal Care Visit  Subjective  Carrie [name] is a 24 y.o. G1P0000 at [redacted]w[redacted]d being seen today for ongoing prenatal care.  She is currently monitored for the following issues for this low-risk pregnancy and has Perirectal abscess and Supervision of normal first pregnancy, antepartum on their problem list.  ----------------------------------------------------------------------------------- Patient reports no complaints.   Contractions: Not present. Vag. Bleeding: None.  Movement: Absent. Denies leaking of fluid.  ----------------------------------------------------------------------------------- The following portions of the patient's history were reviewed and updated as appropriate: allergies, current medications, past family history, past medical history, past social history, past surgical history and problem list. Problem list updated.   Objective  Blood pressure 112/62, weight 141 lb (64 kg), last menstrual period 02/12/2019. Pregravid weight 136 lb (61.7 kg) Total Weight Gain 5 lb (2.268 kg) Urinalysis:      Fetal Status: Fetal Heart Rate (bpm): 145   Movement: Absent     General:  Alert, oriented and cooperative. Patient is in no acute distress.  Skin: Skin is warm and dry. No rash noted.   Cardiovascular: Normal heart rate noted  Respiratory: Normal respiratory effort, no problems with respiration noted  Abdomen: Soft, gravid, appropriate for gestational age. Pain/Pressure: Absent     Pelvic:  Cervical exam deferred        Extremities: Normal range of motion.     ental Status: Normal mood and affect. Normal behavior. Normal judgment and thought content.     Assessment   23 y.o. G1P0000 at [redacted]w[redacted]d by  11/19/2019, by Last Menstrual Period presenting for routine prenatal visit  Plan   Pregnancy#1 Problems (from 02/12/19 to present)    Problem Noted Resolved   Supervision of normal first pregnancy, antepartum 03/28/2019 by Tresea Mall, CNM No   Overview Addendum  05/19/2019  9:34 PM by Farrel Conners, CNM    [facility]inic [facility]  Dating [place] 7 wks Blood type:     Genetic Screen NIPS: diploid XY Antibody:   Anatomic [place]  Rubella:   Varicella: @VZVIGG @  GTT 28 week:  RPR:     Rhogam  HBsAg:     Vaccines TDAP:                       Flu Shot: HIV:     Baby Food Breast                               GBS:   Contraception  Pap: 03/28/19  CBB  No   CS/VBAC n/a   Support Person Partner 03/30/19              Gestational age appropriate obstetric precautions including but not limited to vaginal bleeding, contractions, leaking of fluid and fetal movement were reviewed in detail with the patient.    Return in about 3 weeks (around 07/01/2019) for ROB and anatomy scan.  08/29/2019, MD, Vena Austria [facility], [facility] 06/10/2019, 9:25 AM

## 2019-06-14 ENCOUNTER — Encounter: Payer: Self-pay | Admitting: Obstetrics & Gynecology

## 2019-06-14 ENCOUNTER — Other Ambulatory Visit: Payer: Self-pay

## 2019-06-14 ENCOUNTER — Ambulatory Visit (INDEPENDENT_AMBULATORY_CARE_PROVIDER_SITE_OTHER): Payer: Medicaid Other | Admitting: Obstetrics & Gynecology

## 2019-06-14 VITALS — BP 120/80 | Wt 140.0 lb

## 2019-06-14 DIAGNOSIS — R350 Frequency of micturition: Secondary | ICD-10-CM | POA: Diagnosis not present

## 2019-06-14 DIAGNOSIS — R232 Flushing: Secondary | ICD-10-CM

## 2019-06-14 DIAGNOSIS — R42 Dizziness and giddiness: Secondary | ICD-10-CM

## 2019-06-14 LAB — POCT URINALYSIS DIPSTICK
Bilirubin, UA: NEGATIVE
Blood, UA: NEGATIVE
Glucose, UA: NEGATIVE
Ketones, UA: NEGATIVE
Nitrite, UA: NEGATIVE
Protein, UA: NEGATIVE
Spec Grav, UA: 1.01 (ref 1.010–1.025)
Urobilinogen, UA: 0.2 E.U./dL
pH, UA: 5 (ref 5.0–8.0)

## 2019-06-14 NOTE — Progress Notes (Signed)
Problem Visit    Chief Complaint  Patient presents with  . Dizziness  . Hot Flashes  . Urinary Frequency   History of Present Illness: Patient is a 24 y.o. G1P0000 [redacted]w[redacted]d presenting for Second trimester symptoms of dizziness and feeling faint at times, also hot flashes and frequent urination.  Denies dysuria.  No vag bleeding, no pain.  Rare flutter for fetal movement.  PMHx: She  has a past medical history of Headache. Also,  has a past surgical history that includes Incision and drainage perirectal abscess (N/A, 01/21/2016)., family history includes Cancer in her maternal aunt; Heart disease in her paternal grandmother; Hypertension in her mother.,  reports that she has quit smoking. Her smoking use included cigarettes. She smoked 0.25 packs per day. She has never used smokeless tobacco. She reports current drug use. Drug: Marijuana. She reports that she does not drink alcohol.  She has a current medication list which includes the following prescription(s): doxylamine (sleep), famotidine, prenatal vitamin, and pyridoxine. Also, has No Known Allergies.  Review of Systems  Constitutional: Negative for chills, fever and malaise/fatigue.  HENT: Negative for congestion, sinus pain and sore throat.   Eyes: Negative for blurred vision and pain.  Respiratory: Negative for cough and wheezing.   Cardiovascular: Negative for chest pain and leg swelling.  Gastrointestinal: Negative for abdominal pain, constipation, diarrhea, heartburn, nausea and vomiting.  Genitourinary: Negative for dysuria, frequency, hematuria and urgency.  Musculoskeletal: Negative for back pain, joint pain, myalgias and neck pain.  Skin: Negative for itching and rash.  Neurological: Negative for dizziness, tremors and weakness.  Endo/Heme/Allergies: Does not bruise/bleed easily.  Psychiatric/Behavioral: Negative for depression. The patient is not nervous/anxious and does not have insomnia.     Objective: Vitals:   06/14/19 0900  BP: 120/80  P- 80 R- 20  Physical Exam Constitutional:      General: She is not in acute distress.    Appearance: She is well-developed.  Cardiovascular:     Rate and Rhythm: Normal rate.     Heart sounds: Normal heart sounds. No murmur. No friction rub. No gallop.   Pulmonary:     Effort: Pulmonary effort is normal.     Breath sounds: Normal breath sounds.  Abdominal:     General: Bowel sounds are normal.     Palpations: Abdomen is soft.     Tenderness: There is no abdominal tenderness.     Comments: FHT 150s  Musculoskeletal:        General: Normal range of motion.  Neurological:     Mental Status: She is alert and oriented to person, place, and time.  Skin:    General: Skin is warm and dry.  Vitals reviewed.    Assessment: 24 y.o. G1P0000 [redacted]w[redacted]d 1. Hot flashes - CBC With Differential - TSH - Comprehensive metabolic panel  2. Dizziness - CBC With Differential - TSH - Comprehensive metabolic panel  3. Urinary frequency - Urine Culture-GYN - UA borderline leuk, no other abn  Plan: Problem List Items Addressed This Visit    Hot flashes    -  Primary/New   Relevant Orders   CBC With Differential   TSH   Comprehensive metabolic panel   Dizziness    - Primary/New   Relevant Orders   CBC With Differential   TSH   Comprehensive metabolic panel   Urinary frequency       Relevant Orders   Urine Culture-GYN    A total of 20 minutes were spent face-to-face  with the patient as well as preparation, review, communication, and documentation during this encounter.   Annamarie Major, MD, [facility], [facility] 06/14/2019  9:26 AM

## 2019-06-14 NOTE — Addendum Note (Signed)
Addended by: Cornelius Moras D on: 06/14/2019 09:34 AM   Modules accepted: Orders

## 2019-06-14 NOTE — Patient Instructions (Signed)
Dizziness and Pregnancy  Dizziness is a common problem. It makes you feel unsteady or light-headed. You may feel like you are about to pass out (faint). Dizziness can lead to getting hurt if you stumble or fall. Dizziness can be caused by many things, including:  Medicines.  Not having enough water in your body (dehydration).  Illness. Follow these instructions at home: Eating and drinking   Drink enough fluid to keep your pee (urine) clear or pale yellow. This helps to keep you from getting dehydrated. Try to drink more clear fluids, such as water.  Do not drink alcohol.  Limit how much caffeine you drink or eat, if your doctor tells you to do that.  Limit how much salt (sodium) you drink or eat, if your doctor tells you to do that. Activity   Avoid making quick movements. ? When you stand up from sitting in a chair, steady yourself until you feel okay. ? In the morning, first sit up on the side of the bed. When you feel okay, stand slowly while you hold onto something. Do this until you know that your balance is fine.  If you need to stand in one place for a long time, move your legs often. Tighten and relax the muscles in your legs while you are standing.  Do not drive or use heavy machinery if you feel dizzy.  Avoid bending down if you feel dizzy. Place items in your home so you can reach them easily without leaning over. Lifestyle  Do not use any products that contain nicotine or tobacco, such as cigarettes and e-cigarettes. If you need help quitting, ask your doctor.  Try to lower your stress level. You can do this by using methods such as yoga or meditation. Talk with your doctor if you need help. General instructions  Watch your dizziness for any changes.  Take over-the-counter and prescription medicines only as told by your doctor. Talk with your doctor if you think that you are dizzy because of a medicine that you are taking.  Tell a friend or a family member that  you are feeling dizzy. If he or she notices any changes in your behavior, have this person call your doctor.  Keep all follow-up visits as told by your doctor. This is important. Contact a doctor if:  Your dizziness does not go away.  Your dizziness or light-headedness gets worse.  You feel sick to your stomach (nauseous).  You have trouble hearing.  You have new symptoms.  You are unsteady on your feet.  You feel like the room is spinning. Get help right away if:  You throw up (vomit) or have watery poop (diarrhea), and you cannot eat or drink anything.  You have trouble: ? Talking. ? Walking. ? Swallowing. ? Using your arms, hands, or legs.  You feel generally weak.  You are not thinking clearly, or you have trouble forming sentences. A friend or family member may notice this.  You have: ? Chest pain. ? Pain in your belly (abdomen). ? Shortness of breath. ? Sweating.  Your vision changes.  You are bleeding.  You have a very bad headache.  You have neck pain or a stiff neck.  You have a fever. These symptoms may be an emergency. Do not wait to see if the symptoms will go away. Get medical help right away. Call your local emergency services (911 in the [place] Do not drive yourself to the hospital. Summary  Dizziness makes you feel unsteady  or light-headed. You may feel like you are about to pass out (faint).  Drink enough fluid to keep your pee (urine) clear or pale yellow. Do not drink alcohol.  Avoid making quick movements if you feel dizzy.  Watch your dizziness for any changes. This information is not intended to replace advice given to you by your health care provider. Make sure you discuss any questions you have with your health care provider. Document Revised: 05/19/2017 Document Reviewed: 06/02/2016 Elsevier Patient Education  [place].

## 2019-06-15 LAB — CBC WITH DIFFERENTIAL
Basophils Absolute: 0 10*3/uL (ref 0.0–0.2)
Basos: 0 %
EOS (ABSOLUTE): 0.1 10*3/uL (ref 0.0–0.4)
Eos: 1 %
Hematocrit: 33.7 % — ABNORMAL LOW (ref 34.0–46.6)
Hemoglobin: 11.3 g/dL (ref 11.1–15.9)
Immature Grans (Abs): 0 10*3/uL (ref 0.0–0.1)
Immature Granulocytes: 0 %
Lymphocytes Absolute: 2.9 10*3/uL (ref 0.7–3.1)
Lymphs: 26 %
MCH: 30.4 pg (ref 26.6–33.0)
MCHC: 33.5 g/dL (ref 31.5–35.7)
MCV: 91 fL (ref 79–97)
Monocytes Absolute: 0.6 10*3/uL (ref 0.1–0.9)
Monocytes: 5 %
Neutrophils Absolute: 7.7 10*3/uL — ABNORMAL HIGH (ref 1.4–7.0)
Neutrophils: 68 %
RBC: 3.72 x10E6/uL — ABNORMAL LOW (ref 3.77–5.28)
RDW: 12.4 % (ref 11.7–15.4)
WBC: 11.3 10*3/uL — ABNORMAL HIGH (ref 3.4–10.8)

## 2019-06-15 LAB — COMPREHENSIVE METABOLIC PANEL
ALT: 19 IU/L (ref 0–32)
AST: 23 IU/L (ref 0–40)
Albumin/Globulin Ratio: 1.6 (ref 1.2–2.2)
Albumin: 3.9 g/dL (ref 3.9–5.0)
Alkaline Phosphatase: 69 IU/L (ref 39–117)
BUN/Creatinine Ratio: 10 (ref 9–23)
BUN: 6 mg/dL (ref 6–20)
Bilirubin Total: <0.2 mg/dL (ref 0.0–1.2)
CO2: 20 mmol/L (ref 20–29)
Calcium: 9.5 mg/dL (ref 8.7–10.2)
Chloride: 103 mmol/L (ref 96–106)
Creatinine, Ser: 0.6 mg/dL (ref 0.57–1.00)
GFR calc Af Amer: 150 mL/min/{1.73_m2} (ref >59)
GFR calc non Af Amer: 130 mL/min/{1.73_m2} (ref >59)
Globulin, Total: 2.5 g/dL (ref 1.5–4.5)
Glucose: 80 mg/dL (ref 65–99)
Potassium: 3.7 mmol/L (ref 3.5–5.2)
Sodium: 136 mmol/L (ref 134–144)
Total Protein: 6.4 g/dL (ref 6.0–8.5)

## 2019-06-15 LAB — TSH: TSH: 0.842 u[IU]/mL (ref 0.450–4.500)

## 2019-06-16 LAB — URINE CULTURE

## 2019-07-01 ENCOUNTER — Encounter: Payer: Medicaid Other | Admitting: Obstetrics and Gynecology

## 2019-07-01 ENCOUNTER — Ambulatory Visit: Payer: Medicaid Other

## 2019-07-04 ENCOUNTER — Encounter: Payer: Self-pay | Admitting: Obstetrics & Gynecology

## 2019-07-04 ENCOUNTER — Other Ambulatory Visit: Payer: Self-pay

## 2019-07-04 ENCOUNTER — Ambulatory Visit (INDEPENDENT_AMBULATORY_CARE_PROVIDER_SITE_OTHER): Payer: Medicaid Other | Admitting: Obstetrics & Gynecology

## 2019-07-04 ENCOUNTER — Ambulatory Visit (INDEPENDENT_AMBULATORY_CARE_PROVIDER_SITE_OTHER): Payer: Medicaid Other

## 2019-07-04 VITALS — BP 120/80 | Wt 148.0 lb

## 2019-07-04 DIAGNOSIS — Z3A20 20 weeks gestation of pregnancy: Secondary | ICD-10-CM

## 2019-07-04 DIAGNOSIS — Z3400 Encounter for supervision of normal first pregnancy, unspecified trimester: Secondary | ICD-10-CM

## 2019-07-04 DIAGNOSIS — Z363 Encounter for antenatal screening for malformations: Secondary | ICD-10-CM | POA: Diagnosis not present

## 2019-07-04 DIAGNOSIS — Z131 Encounter for screening for diabetes mellitus: Secondary | ICD-10-CM

## 2019-07-04 LAB — POCT URINALYSIS DIPSTICK OB
Glucose, UA: NEGATIVE
POC,PROTEIN,UA: NEGATIVE

## 2019-07-04 NOTE — Patient Instructions (Signed)

## 2019-07-04 NOTE — Progress Notes (Signed)
  Subjective  Fetal Movement? yes Contractions? no Leaking Fluid? no Vaginal Bleeding? no Nausea? no Objective  BP 120/80   Wt 148 lb (67.1 kg)   LMP 02/12/2019 (Exact Date)   BMI 27.07 kg/m  General: NAD Pumonary: no increased work of breathing Abdomen: gravid, non-tender Extremities: no edema Psychiatric: mood appropriate, affect full  Assessment  23 y.o. G1P0000 at [redacted]w[redacted]d by  11/19/2019, by Last Menstrual Period presenting for routine prenatal visit  Plan   Problem List Items Addressed This Visit    Supervision of normal first pregnancy, antepartum   [redacted] weeks gestation of pregnancy       PNV   Screening for diabetes mellitus       Relevant Orders   28 Week RH+Panel ordered for later      Pregnancy#1 Problems (from 02/12/19 to present)    Problem Noted Resolved   Supervision of normal first pregnancy, antepartum 03/28/2019 by Tresea Mall, CNM No   Overview Addendum 05/19/2019  9:34 PM by Farrel Conners, CNM    [facility] Prenatal Labs  Dating [place] 7 wks Blood type:   A+  Genetic Screen NIPS: diploid XY Antibody: Neg  Anatomic [place]  Rubella: Imm  Varicella:NonImm  GTT 28 week:  RPR:   -  Rhogam  HBsAg:-     Vaccines TDAP:                       Flu Shot: HIV:   -  Baby Food Breast                               GBS: p  Contraception  Pap: 03/28/19  CBB  No   CS/VBAC n/a   Support Person Partner Mirian Mo, MD, [facility], [facility] 07/04/2019  9:24 AM

## 2019-07-04 NOTE — Addendum Note (Signed)
Addended by: Cornelius Moras D on: 07/04/2019 09:30 AM   Modules accepted: Orders

## 2019-07-05 ENCOUNTER — Encounter: Payer: Medicaid Other | Admitting: Obstetrics & Gynecology

## 2019-07-05 ENCOUNTER — Ambulatory Visit: Payer: Medicaid Other

## 2019-07-08 ENCOUNTER — Telehealth: Payer: Self-pay

## 2019-07-08 ENCOUNTER — Other Ambulatory Visit: Payer: Self-pay | Admitting: Obstetrics & Gynecology

## 2019-07-08 MED ORDER — CITRANATAL ASSURE 35-1 & 300 MG PO MISC: 2.0000 | Freq: Every day | ORAL | 11 refills | Status: DC

## 2019-07-08 NOTE — Telephone Encounter (Signed)
Pt wanting RX for PNV. Can you send some in for her please?

## 2019-07-24 ENCOUNTER — Telehealth: Payer: Self-pay

## 2019-07-24 NOTE — Telephone Encounter (Signed)
Pt calling; saw a little blood in her stool this morning; no vaginal bleeding.  What to do?  Is this normal?  [phone] Adv probably has a hemorrhoid.  To use Prep H, warm sitz baths BID, avoid constipation.  To also get colace which will work better if she is drinking 64oz of water a day and eating fresh fruits and vegetables.

## 2019-08-01 ENCOUNTER — Other Ambulatory Visit: Payer: Self-pay

## 2019-08-01 ENCOUNTER — Encounter: Payer: Self-pay | Admitting: Advanced Practice Midwife

## 2019-08-01 ENCOUNTER — Ambulatory Visit (INDEPENDENT_AMBULATORY_CARE_PROVIDER_SITE_OTHER): Payer: Medicaid Other | Admitting: Advanced Practice Midwife

## 2019-08-01 DIAGNOSIS — Z3402 Encounter for supervision of normal first pregnancy, second trimester: Secondary | ICD-10-CM

## 2019-08-01 DIAGNOSIS — Z113 Encounter for screening for infections with a predominantly sexual mode of transmission: Secondary | ICD-10-CM

## 2019-08-01 DIAGNOSIS — Z131 Encounter for screening for diabetes mellitus: Secondary | ICD-10-CM

## 2019-08-01 DIAGNOSIS — Z130 Encounter for screening for diseases of the blood and blood-forming organs and certain disorders involving the immune mechanism: Secondary | ICD-10-CM

## 2019-08-01 DIAGNOSIS — Z3A24 24 weeks gestation of pregnancy: Secondary | ICD-10-CM

## 2019-08-01 DIAGNOSIS — Z3400 Encounter for supervision of normal first pregnancy, unspecified trimester: Secondary | ICD-10-CM

## 2019-08-01 NOTE — Progress Notes (Signed)
Routine Prenatal Care Visit- Virtual Visit  Subjective   Virtual Visit via Telephone Note  I connected with Carrie [name] on 08/01/19 at  8:35 AM EST by telephone and verified that I am speaking with the correct person using two identifiers.   I discussed the limitations, risks, security and privacy concerns of performing an evaluation and management service by telephone and the availability of in person appointments. I also discussed with the patient that there may be a patient responsible charge related to this service. The patient expressed understanding and agreed to proceed.  The patient was at home I spoke with the patient from my  Office phone The names of people involved in this encounter were: Carrie [name] and myself Tresea Mall, CNM  Carrie [name] is a 24 y.o. G1P0000 at [redacted]w[redacted]d being seen today for ongoing prenatal care.  She is currently monitored for the following issues for this low-risk pregnancy and has Perirectal abscess and Supervision of normal first pregnancy, antepartum on their problem list.  ----------------------------------------------------------------------------------- Patient reports no complaints.    . Vag. Bleeding: None.  Movement: Present. Denies leaking of fluid.  ----------------------------------------------------------------------------------- The following portions of the patient's history were reviewed and updated as appropriate: allergies, current medications, past family history, past medical history, past social history, past surgical history and problem list. Problem list updated.   Objective  Last menstrual period 02/12/2019. Pregravid weight 136 lb (61.7 kg) Total Weight Gain 12 lb (5.443 kg) Urinalysis:      Fetal Status:     Movement: Present     Physical Exam could not be performed. Because of the COVID-19 outbreak this visit was performed over the phone and not in person.   Assessment   24 y.o. G1P0000 at [redacted]w[redacted]d by  11/19/2019,  by Last Menstrual Period presenting for routine prenatal visit  Plan   Pregnancy#1 Problems (from 02/12/19 to present)    Problem Noted Resolved   Supervision of normal first pregnancy, antepartum 03/28/2019 by Tresea Mall, CNM No   Overview Addendum 07/04/2019  9:25 AM by Nadara Mustard, MD    [facility] Prenatal Labs  Dating [place] 7 wks Blood type: A/Positive/-- (12/21 1544)   Genetic Screen NIPS: diploid XY Antibody:Negative (12/21 1544)  Anatomic [place] [facility] nml Rubella: 1.24 (12/21 1544) Varicella: NonImmune  GTT 28 week:  RPR: Non Reactive (12/21 1544)   Rhogam n/a HBsAg: Negative (12/21 1544)   Vaccines TDAP:                       Flu Shot: HIV: Non Reactive (12/21 1544)   Baby Food Breast                               GBS:   Contraception  Pap: 03/28/19  CBB  No   CS/VBAC n/a   Support Person Partner Francee Piccolo              Gestational age appropriate obstetric precautions including but not limited to vaginal bleeding, contractions, leaking of fluid and fetal movement were reviewed in detail with the patient.     Follow Up Instructions:   I discussed the assessment and treatment plan with the patient. The patient was provided an opportunity to ask questions and all were answered. The patient agreed with the plan and demonstrated an understanding of the instructions.   The patient was advised to call back or seek an  in-person evaluation if the symptoms worsen or if the condition fails to improve as anticipated.  I provided 10 minutes of non-face-to-face time during this encounter.  Return in about 4 weeks (around 08/29/2019) for 28 wk labs and rob.   Rod Can, [facility] 08/01/2019, 8:50 AM

## 2019-08-07 ENCOUNTER — Encounter: Payer: Self-pay | Admitting: Advanced Practice Midwife

## 2019-08-07 ENCOUNTER — Other Ambulatory Visit: Payer: Self-pay

## 2019-08-07 ENCOUNTER — Ambulatory Visit (INDEPENDENT_AMBULATORY_CARE_PROVIDER_SITE_OTHER): Payer: Medicaid Other | Admitting: Advanced Practice Midwife

## 2019-08-07 VITALS — BP 100/60 | Wt 153.0 lb

## 2019-08-07 DIAGNOSIS — Z3A25 25 weeks gestation of pregnancy: Secondary | ICD-10-CM

## 2019-08-07 DIAGNOSIS — Z3402 Encounter for supervision of normal first pregnancy, second trimester: Secondary | ICD-10-CM

## 2019-08-07 LAB — POCT URINALYSIS DIPSTICK OB
Glucose, UA: NEGATIVE
POC,PROTEIN,UA: NEGATIVE

## 2019-08-07 NOTE — Patient Instructions (Signed)

## 2019-08-07 NOTE — Progress Notes (Signed)
  Routine Prenatal Care Visit  Subjective  Carrie [name] is a 24 y.o. G1P0000 at [redacted]w[redacted]d being seen today for ongoing prenatal care.  She is currently monitored for the following issues for this low-risk pregnancy and has Perirectal abscess and Supervision of normal first pregnancy, antepartum on their problem list.  ----------------------------------------------------------------------------------- Patient reports stretching pain in her lower abdomen bilateral since last night. The pain has been coming and going. She denies cramping. She denies vaginal bleeding or leakage of fluid. She reports good fetal movement. We discussed round ligament pain and comfort measures.   Contractions: Not present. Vag. Bleeding: None.  Movement: Present. Leaking Fluid denies.  ----------------------------------------------------------------------------------- The following portions of the patient's history were reviewed and updated as appropriate: allergies, current medications, past family history, past medical history, past social history, past surgical history and problem list. Problem list updated.  Objective  Blood pressure 100/60, weight 153 lb (69.4 kg), last menstrual period 02/12/2019. Pregravid weight 136 lb (61.7 kg) Total Weight Gain 17 lb (7.711 kg) Urinalysis: Urine Protein    Urine Glucose    Fetal Status: Fetal Heart Rate (bpm): 152   Movement: Present     General:  Alert, oriented and cooperative. Patient is in no acute distress.  Skin: Skin is warm and dry. No rash noted.   Cardiovascular: Normal heart rate noted  Respiratory: Normal respiratory effort, no problems with respiration noted  Abdomen: Soft, gravid, appropriate for gestational age. Pain/Pressure: Present     Pelvic:  Cervical exam deferred        Extremities: Normal range of motion.     Mental Status: Normal mood and affect. Normal behavior. Normal judgment and thought content.   Assessment   23 y.o. G1P0000 at [redacted]w[redacted]d by   11/19/2019, by Last Menstrual Period presenting for work-in prenatal visit  Plan   Pregnancy#1 Problems (from 02/12/19 to present)    Problem Noted Resolved   Supervision of normal first pregnancy, antepartum 03/28/2019 by Tresea Mall, CNM No   Overview Addendum 07/04/2019  9:25 AM by Nadara Mustard, MD    [facility] Prenatal Labs  Dating [place] 7 wks Blood type: A/Positive/-- (12/21 1544)   Genetic Screen NIPS: diploid XY Antibody:Negative (12/21 1544)  Anatomic [place] [facility] nml Rubella: 1.24 (12/21 1544) Varicella: NonImmune  GTT 28 week:  RPR: Non Reactive (12/21 1544)   Rhogam n/a HBsAg: Negative (12/21 1544)   Vaccines TDAP:                       Flu Shot: HIV: Non Reactive (12/21 1544)   Baby Food Breast                               GBS:   Contraception  Pap: 03/28/19  CBB  No   CS/VBAC n/a   Support Person Partner Derek              Preterm labor symptoms and general obstetric precautions including but not limited to vaginal bleeding, contractions, leaking of fluid and fetal movement were reviewed in detail with the patient. Please refer to After Visit Summary for other counseling recommendations.   Return for has follow up scheduled.  Tresea Mall, CNM 08/07/2019 2:09 PM

## 2019-08-21 ENCOUNTER — Other Ambulatory Visit: Payer: Self-pay

## 2019-08-21 ENCOUNTER — Encounter: Payer: Self-pay | Admitting: Obstetrics and Gynecology

## 2019-08-21 ENCOUNTER — Observation Stay
Admission: EM | Admit: 2019-08-21 | Discharge: 2019-08-21 | Disposition: A | Discharge status: Home / Self Care | Payer: Medicaid Other | Attending: Certified Nurse Midwife | Admitting: Certified Nurse Midwife

## 2019-08-21 DIAGNOSIS — N949 Unspecified condition associated with female genital organs and menstrual cycle: Secondary | ICD-10-CM | POA: Diagnosis present

## 2019-08-21 DIAGNOSIS — Z3A27 27 weeks gestation of pregnancy: Secondary | ICD-10-CM | POA: Diagnosis not present

## 2019-08-21 DIAGNOSIS — O368120 Decreased fetal movements, second trimester, not applicable or unspecified: Secondary | ICD-10-CM | POA: Diagnosis present

## 2019-08-21 NOTE — Discharge Summary (Signed)
RN reviewed discharge instructions with patient. Gave patient opportunity for questions. No questions at this time. Pt verbalized understanding. Pt discharged home.

## 2019-08-21 NOTE — Progress Notes (Signed)
RN spoke with provider about patient complaint. Provider reviewed strip. Provider to see patient and give discharge instructions. Pt removed from fetal monitoring.

## 2019-08-21 NOTE — Discharge Instructions (Signed)
1. Avoid smoking marijuana Marijuana Use During Pregnancy and Breastfeeding  Marijuana is the dried leaves, flowers, and stems of the Cannabis sativa or Cannabis indica plant. The plant's active ingredients (cannabinoids), including a chemical called THC, change the chemistry of the brain. Marijuana smoke also has many of the same chemicals as cigarette smoke that cause breathing problems. Marijuana gets into your blood through your lungs when you smoke it and through your digestive system when you swallow it. Using marijuana in any form may be harmful for you and your baby when you are trying to become pregnant and during pregnancy. This includes marijuana that is prescribed to you by a health care provider (medical marijuana). Once marijuana is in your blood, it can travel through your placenta to your baby. It may also pass through breast milk. How does this affect me? Marijuana affects you both mentally and physically. Using marijuana can make you feel high and relaxed. It can also have negative effects, especially at high doses or with long-term use. These include:  Rapid heartbeat and stress on your heart.  Lung irritation and breathing problems.  Difficulty thinking and making decisions.  Seeing or believing things that are not true (hallucinations and paranoia).  Mood swings, depression, or anxiety.  Decreased ability to learn and remember.  Difficulty getting pregnant. Marijuana can also affect your pregnancy. Not all the effects are known. However, if you use marijuana during pregnancy, you may:  Be less likely to get regular prenatal care and do the things that you need to do to have a healthy pregnancy.  Be more likely to use other drugs that can harm your pregnancy, like drinking alcohol and smoking cigarettes.  Be at higher risk of having your baby die after 28 weeks of pregnancy (stillbirth).  Be at higher risk of giving birth before 37 weeks of pregnancy (premature  birth). How does this affect my baby? If you use marijuana during pregnancy, this may affect your baby's development, birth, and life after birth. Your baby may:  Be born prematurely, which can cause physical and mental problems.  Be born with a low birth weight, which can lead to physical and mental problems.  Have problems with brain development.  Have difficulty growing.  Have attention and behavior problems later in life.  Do poorly at school and have learning problems later in life.  Have problems with vision and coordination.  Be at higher risk for using marijuana by age 37. More research is needed to find out exactly how marijuana affects a baby during breastfeeding. Some studies suggest that the chemicals in marijuana can be passed to a baby through breast milk. To limit possible risks, you should not use marijuana during breastfeeding. Follow these instructions at home:  Let your health care provider know if you use marijuana before trying to get pregnant, during pregnancy, or during breastfeeding.  Do not use marijuana in any form when you are trying to get pregnant, when you are pregnant, or when you are breastfeeding. If you are having trouble stopping marijuana use, ask your health care provider for help.  Do not smoke. If you need help quitting, ask your health care provider for help.  If you are using medical marijuana, ask your health care provider to switch you to a medicine that is safer to use during pregnancy or breastfeeding.  Keep all your prenatal visits as told by your health care provider. This is important. Where to find more information [employer] on Drug Abuse:  www.drugabuse.gov March of Dimes: www.marchofdimes.org/pregnancy Contact a health care provider if:  You use marijuana and want to get pregnant.  You use marijuana during pregnancy or breastfeeding.  You need help stopping marijuana use. Get help right away if:  Your baby is not  gaining weight or growing as expected. Summary  Using marijuana in any form may be harmful for you and your baby when you are trying to become pregnant, during pregnancy, and during breastfeeding. This includes marijuana that is prescribed to you (medical marijuana).  Some studies suggest that marijuana may pass through breast milk and can affect your baby's brain development.  Talk to your health care provider if you use marijuana in any form while trying to get pregnant, during pregnancy, or while breastfeeding.  Ask your health care provider for help if you are not able to stop using marijuana. This information is not intended to replace advice given to you by your health care provider. Make sure you discuss any questions you have with your health care provider. Document Revised: 09/07/2018 Document Reviewed: 02/01/2017 Elsevier Patient Education  2020 [employer]  2. Recommend getting a maternity support belt for round ligament pain  3. Baby should move at least 10 times a day. If you are concerned that baby is not moving well. Eat something and lie down on your side. You should be able to feel baby move 4 times over the next hour. If baby does not move 4 times, call labor and delivery [phone] and come in for monitoring.

## 2019-08-21 NOTE — OB Triage Note (Signed)
Pt is G1P0 at 27.1 weeks arrived this am c/o rectal bleeding, pelvic pain and decreased fetal movement. Pt states when she wipes there is some bright red blood on the tissue. Last year she had surgery for perirectal abscess. Pelvic pain is described as muscle and ligament pain on either side. Pt states baby is generally very active and in the past 2 days she hasnt felt he moved as much. + FM felt and audible on monitor. No acute distress. Will monitor and call MD

## 2019-08-21 NOTE — Final Progress Note (Signed)
Physician Final Progress Note  Patient ID: Carrie [name] MRN: [id number] DOB/AGE: [birth year] 23 y.o.  Admit date: 08/21/2019 Admitting provider: Vena Austria, MD/ Gasper Lloyd. Donye Campanelli Discharge date: 08/21/2019   Admission Diagnoses: IUP at 27wk1d with decreased fetal movement Intermittent bilateral lower abdominal pain Discharge Diagnoses:  Active Problems:   Reactive non stress test   Round ligament pain Marijuana use in pregnancy   Consults: None  Significant Findings/ Diagnostic Studies: 24 year old G1 P0 with EDC=11/19/2019 presents this AM at 23.[redacted] weeks gestation with complaints of decreased fetal movement over the last 2 days. . She also has concerns for intermittent bilateral lower abdominal pains while up walking, or if she turns in bed. These pains can be sharp. The pains resolve with rest.  Complained of similar pains at last ROB visit 3/10 and was diagnosed with round ligament pain. Patient admits to continued use of marijuana in pregnancy. Her prenatal care is otherwise uncomplicated.  [facility] Prenatal Labs  Dating [place] 7 wks Blood type: A/Positive/-- (12/21 1544)   Genetic Screen NIPS: diploid XY Antibody:Negative (12/21 1544)  Anatomic [place] [facility] nml Rubella: 1.24 (12/21 1544) Varicella: NonImmune  GTT 28 week:  RPR: Non Reactive (12/21 1544)   Rhogam n/a HBsAg: Negative (12/21 1544)   Vaccines TDAP:                       Flu Shot: HIV: Non Reactive (12/21 1544)   Baby Food Breast                               GBS:   Contraception  Pap: 03/28/19  CBB  No   CS/VBAC n/a   Support Person Partner Derek    Exam: General: gravid BF in NAD. Has been feeling baby move since monitor applied. Vital signs: BP 112/61   Pulse 70   Temp 97.9 F (36.6 C) (Oral)   Ht 5\' 2"  (1.575 m)   Wt 69.4 kg   LMP 02/12/2019 (Exact Date)   BMI 27.98 kg/m   FHR: baseline 140 with accelerations to 150s, moderate variability (10x10 accelerations) Toco: no contractions  A: IUP at  27.1 weeks with reactive NST Round ligament pain  P: Discharge home Fetal kick count instructions Recommend maternity support belt ROB on 1 April GIven information on effects of marijuana on the fetus   Procedures: none  Discharge Condition: stable  Disposition: Discharge disposition: 01-Home or Self Care       Diet: Regular diet  Discharge Activity: Activity as tolerated   Allergies as of 08/21/2019   No Known Allergies     Medication List    TAKE these medications   CitraNatal Assure 35-1 & 300 MG tablet Take 2 tablets by mouth daily.   pyridOXINE 50 MG tablet Commonly known as: VITAMIN B-6 Take 50 mg by mouth daily.        Total time spent taking care of this patient: 10 minutes  Signed: 08/23/2019 08/21/2019, 8:25 AM

## 2019-08-29 ENCOUNTER — Ambulatory Visit (INDEPENDENT_AMBULATORY_CARE_PROVIDER_SITE_OTHER): Payer: Medicaid Other | Admitting: Certified Nurse Midwife

## 2019-08-29 ENCOUNTER — Other Ambulatory Visit: Payer: Medicaid Other

## 2019-08-29 ENCOUNTER — Other Ambulatory Visit: Payer: Self-pay

## 2019-08-29 VITALS — BP 120/70 | Wt 163.0 lb

## 2019-08-29 DIAGNOSIS — Z3400 Encounter for supervision of normal first pregnancy, unspecified trimester: Secondary | ICD-10-CM

## 2019-08-29 DIAGNOSIS — Z113 Encounter for screening for infections with a predominantly sexual mode of transmission: Secondary | ICD-10-CM

## 2019-08-29 DIAGNOSIS — Z130 Encounter for screening for diseases of the blood and blood-forming organs and certain disorders involving the immune mechanism: Secondary | ICD-10-CM

## 2019-08-29 DIAGNOSIS — Z3A28 28 weeks gestation of pregnancy: Secondary | ICD-10-CM

## 2019-08-29 DIAGNOSIS — Z131 Encounter for screening for diabetes mellitus: Secondary | ICD-10-CM

## 2019-08-29 DIAGNOSIS — Z3403 Encounter for supervision of normal first pregnancy, third trimester: Secondary | ICD-10-CM

## 2019-08-29 DIAGNOSIS — F1290 Cannabis use, unspecified, uncomplicated: Secondary | ICD-10-CM

## 2019-08-29 LAB — POCT URINALYSIS DIPSTICK OB
Glucose, UA: NEGATIVE
POC,PROTEIN,UA: NEGATIVE

## 2019-08-29 NOTE — Progress Notes (Signed)
No concerns.rj 

## 2019-08-30 LAB — 28 WEEK RH+PANEL
Basophils Absolute: 0 10*3/uL (ref 0.0–0.2)
Basos: 0 %
EOS (ABSOLUTE): 0.2 10*3/uL (ref 0.0–0.4)
Eos: 1 %
Gestational Diabetes Screen: 130 mg/dL (ref 65–139)
HIV Screen 4th Generation wRfx: NONREACTIVE
Hematocrit: 33.1 % — ABNORMAL LOW (ref 34.0–46.6)
Hemoglobin: 11.4 g/dL (ref 11.1–15.9)
Immature Grans (Abs): 0 10*3/uL (ref 0.0–0.1)
Immature Granulocytes: 0 %
Lymphocytes Absolute: 2.4 10*3/uL (ref 0.7–3.1)
Lymphs: 21 %
MCH: 31.6 pg (ref 26.6–33.0)
MCHC: 34.4 g/dL (ref 31.5–35.7)
MCV: 92 fL (ref 79–97)
Monocytes Absolute: 0.6 10*3/uL (ref 0.1–0.9)
Monocytes: 5 %
Neutrophils Absolute: 8.1 10*3/uL — ABNORMAL HIGH (ref 1.4–7.0)
Neutrophils: 73 %
Platelets: 310 10*3/uL (ref 150–450)
RBC: 3.61 x10E6/uL — ABNORMAL LOW (ref 3.77–5.28)
RDW: 12.4 % (ref 11.7–15.4)
RPR Ser Ql: NONREACTIVE
WBC: 11.3 10*3/uL — ABNORMAL HIGH (ref 3.4–10.8)

## 2019-08-30 NOTE — Progress Notes (Signed)
ROB at 28wk2d: Doing well. Good fetal movement. Having 28 week labs today. A POS blood type ?breast feeding  Exam: FH 31 cm/ FHT 140  A: IUP at 28wk2d S>D  P: Recommend childbirth classes and breast feeding classes ROB in 2 weeks Consider ultrasound for growth if size remains greater than dates  Farrel Conners, [place]

## 2019-09-05 LAB — URINE DRUG PANEL 7
Amphetamines, Urine: NEGATIVE ng/mL
Barbiturate Quant, Ur: NEGATIVE ng/mL
Benzodiazepine Quant, Ur: NEGATIVE ng/mL
Cannabinoid Quant, Ur: POSITIVE — AB
Cocaine (Metab.): NEGATIVE ng/mL
Opiate Quant, Ur: NEGATIVE ng/mL
PCP Quant, Ur: NEGATIVE ng/mL

## 2019-09-13 ENCOUNTER — Other Ambulatory Visit: Payer: Self-pay

## 2019-09-13 ENCOUNTER — Encounter: Payer: Self-pay | Admitting: Advanced Practice Midwife

## 2019-09-13 ENCOUNTER — Ambulatory Visit (INDEPENDENT_AMBULATORY_CARE_PROVIDER_SITE_OTHER): Payer: Medicaid Other | Admitting: Advanced Practice Midwife

## 2019-09-13 VITALS — BP 100/70 | Wt 160.0 lb

## 2019-09-13 DIAGNOSIS — Z3A30 30 weeks gestation of pregnancy: Secondary | ICD-10-CM

## 2019-09-13 DIAGNOSIS — Z3400 Encounter for supervision of normal first pregnancy, unspecified trimester: Secondary | ICD-10-CM

## 2019-09-13 DIAGNOSIS — Z3403 Encounter for supervision of normal first pregnancy, third trimester: Secondary | ICD-10-CM

## 2019-09-13 DIAGNOSIS — Z23 Encounter for immunization: Secondary | ICD-10-CM | POA: Diagnosis not present

## 2019-09-13 LAB — POCT URINALYSIS DIPSTICK OB
Glucose, UA: NEGATIVE
POC,PROTEIN,UA: NEGATIVE

## 2019-09-13 NOTE — Progress Notes (Signed)
  Routine Prenatal Care Visit  Subjective  Carrie [name] is a 24 y.o. G1P0000 at [redacted]w[redacted]d being seen today for ongoing prenatal care.  She is currently monitored for the following issues for this low-risk pregnancy and has Perirectal abscess; Supervision of normal first pregnancy, antepartum; Decreased fetal movement affecting management of pregnancy in second trimester; and Round ligament pain on their problem list.  ----------------------------------------------------------------------------------- Patient reports feeling tired.  Baby moves all night. Contractions: Not present. Vag. Bleeding: None.  Movement: Present. Leaking Fluid denies.  ----------------------------------------------------------------------------------- The following portions of the patient's history were reviewed and updated as appropriate: allergies, current medications, past family history, past medical history, past social history, past surgical history and problem list. Problem list updated.  Objective  Blood pressure 100/70, weight 160 lb (72.6 kg), last menstrual period 02/12/2019. Pregravid weight 136 lb (61.7 kg) Total Weight Gain 24 lb (10.9 kg) Urinalysis: Urine Protein Negative  Urine Glucose Negative  Fetal Status: Fetal Heart Rate (bpm): 144 Fundal Height: 30 cm Movement: Present     General:  Alert, oriented and cooperative. Patient is in no acute distress.  Skin: Skin is warm and dry. No rash noted.   Cardiovascular: Normal heart rate noted  Respiratory: Normal respiratory effort, no problems with respiration noted  Abdomen: Soft, gravid, appropriate for gestational age. Pain/Pressure: Absent     Pelvic:  Cervical exam deferred        Extremities: Normal range of motion.     Mental Status: Normal mood and affect. Normal behavior. Normal judgment and thought content.   Assessment   23 y.o. G1P0000 at [redacted]w[redacted]d by  11/19/2019, by Last Menstrual Period presenting for routine prenatal visit  Plan    Pregnancy#1 Problems (from 02/12/19 to present)    Problem Noted Resolved   Supervision of normal first pregnancy, antepartum 03/28/2019 by Tresea Mall, CNM No   Overview Addendum 09/13/2019  9:14 AM by Tresea Mall, CNM    [facility] Prenatal Labs  Dating [place] 7 wks Blood type: A/Positive/-- (12/21 1544)   Genetic Screen NIPS: diploid XY Antibody:Negative (12/21 1544)  Anatomic [place] [facility] nml Rubella: 1.24 (12/21 1544) Varicella: NonImmune  GTT 28 week:  RPR: Non Reactive (12/21 1544)   Rhogam n/a HBsAg: Negative (12/21 1544)   Vaccines TDAP: 09/13/19                      Flu Shot: HIV: Non Reactive (12/21 1544)   Baby Food Breast                               GBS:   Contraception  Pap: 03/28/19 NIL  CBB  No   CS/VBAC n/a   Support Person Partner Derek              Preterm labor symptoms and general obstetric precautions including but not limited to vaginal bleeding, contractions, leaking of fluid and fetal movement were reviewed in detail with the patient. Please refer to After Visit Summary for other counseling recommendations.   Return in about 2 weeks (around 09/27/2019) for rob.  Tresea Mall, CNM 09/13/2019 9:22 AM

## 2019-09-13 NOTE — Progress Notes (Signed)
ROB/Tdap and BT consent today- no concerns

## 2019-09-13 NOTE — Patient Instructions (Signed)
Third Trimester of Pregnancy The third trimester is from week 28 through week 40 (months 7 through 9). The third trimester is a time when the unborn baby (fetus) is growing rapidly. At the end of the ninth month, the fetus is about 20 inches in length and weighs 6-10 pounds. Body changes during your third trimester Your body will continue to go through many changes during pregnancy. The changes vary from woman to woman. During the third trimester:  Your weight will continue to increase. You can expect to gain 25-35 pounds (11-16 kg) by the end of the pregnancy.  You may begin to get stretch marks on your hips, abdomen, and breasts.  You may urinate more often because the fetus is moving lower into your pelvis and pressing on your bladder.  You may develop or continue to have heartburn. This is caused by increased hormones that slow down muscles in the digestive tract.  You may develop or continue to have constipation because increased hormones slow digestion and cause the muscles that push waste through your intestines to relax.  You may develop hemorrhoids. These are swollen veins (varicose veins) in the rectum that can itch or be painful.  You may develop swollen, bulging veins (varicose veins) in your legs.  You may have increased body aches in the pelvis, back, or thighs. This is due to weight gain and increased hormones that are relaxing your joints.  You may have changes in your hair. These can include thickening of your hair, rapid growth, and changes in texture. Some women also have hair loss during or after pregnancy, or hair that feels dry or thin. Your hair will most likely return to normal after your baby is born.  Your breasts will continue to grow and they will continue to become tender. A yellow fluid (colostrum) may leak from your breasts. This is the first milk you are producing for your baby.  Your belly button may stick out.  You may notice more swelling in your hands,  face, or ankles.  You may have increased tingling or numbness in your hands, arms, and legs. The skin on your belly may also feel numb.  You may feel short of breath because of your expanding uterus.  You may have more problems sleeping. This can be caused by the size of your belly, increased need to urinate, and an increase in your body's metabolism.  You may notice the fetus "dropping," or moving lower in your abdomen (lightening).  You may have increased vaginal discharge.  You may notice your joints feel loose and you may have pain around your pelvic bone. What to expect at prenatal visits You will have prenatal exams every 2 weeks until week 36. Then you will have weekly prenatal exams. During a routine prenatal visit:  You will be weighed to make sure you and the baby are growing normally.  Your blood pressure will be taken.  Your abdomen will be measured to track your baby's growth.  The fetal heartbeat will be listened to.  Any test results from the previous visit will be discussed.  You may have a cervical check near your due date to see if your cervix has softened or thinned (effaced).  You will be tested for Group B streptococcus. This happens between 35 and 37 weeks. Your health care provider may ask you:  What your birth plan is.  How you are feeling.  If you are feeling the baby move.  If you have had any abnormal   symptoms, such as leaking fluid, bleeding, severe headaches, or abdominal cramping.  If you are using any tobacco products, including cigarettes, chewing tobacco, and electronic cigarettes.  If you have any questions. Other tests or screenings that may be performed during your third trimester include:  Blood tests that check for low iron levels (anemia).  Fetal testing to check the health, activity level, and growth of the fetus. Testing is done if you have certain medical conditions or if there are problems during the pregnancy.  Nonstress test  (NST). This test checks the health of your baby to make sure there are no signs of problems, such as the baby not getting enough oxygen. During this test, a belt is placed around your belly. The baby is made to move, and its heart rate is monitored during movement. What is false labor? False labor is a condition in which you feel small, irregular tightenings of the muscles in the womb (contractions) that usually go away with rest, changing position, or drinking water. These are called Braxton Hicks contractions. Contractions may last for hours, days, or even weeks before true labor sets in. If contractions come at regular intervals, become more frequent, increase in intensity, or become painful, you should see your health care provider. What are the signs of labor?  Abdominal cramps.  Regular contractions that start at 10 minutes apart and become stronger and more frequent with time.  Contractions that start on the top of the uterus and spread down to the lower abdomen and back.  Increased pelvic pressure and dull back pain.  A watery or bloody mucus discharge that comes from the vagina.  Leaking of amniotic fluid. This is also known as your "water breaking." It could be a slow trickle or a gush. Let your health care provider know if it has a color or strange odor. If you have any of these signs, call your health care provider right away, even if it is before your due date. Follow these instructions at home: Medicines  Follow your health care provider's instructions regarding medicine use. Specific medicines may be either safe or unsafe to take during pregnancy.  Take a prenatal vitamin that contains at least 600 micrograms (mcg) of folic acid.  If you develop constipation, try taking a stool softener if your health care provider approves. Eating and drinking   Eat a balanced diet that includes fresh fruits and vegetables, whole grains, good sources of protein such as meat, eggs, or tofu,  and low-fat dairy. Your health care provider will help you determine the amount of weight gain that is right for you.  Avoid raw meat and uncooked cheese. These carry germs that can cause birth defects in the baby.  If you have low calcium intake from food, talk to your health care provider about whether you should take a daily calcium supplement.  Eat four or five small meals rather than three large meals a day.  Limit foods that are high in fat and processed sugars, such as fried and sweet foods.  To prevent constipation: ? Drink enough fluid to keep your urine clear or pale yellow. ? Eat foods that are high in fiber, such as fresh fruits and vegetables, whole grains, and beans. Activity  Exercise only as directed by your health care provider. Most women can continue their usual exercise routine during pregnancy. Try to exercise for 30 minutes at least 5 days a week. Stop exercising if you experience uterine contractions.  Avoid heavy lifting.  Do   not exercise in extreme heat or humidity, or at high altitudes.  Wear low-heel, comfortable shoes.  Practice good posture.  You may continue to have sex unless your health care provider tells you otherwise. Relieving pain and discomfort  Take frequent breaks and rest with your legs elevated if you have leg cramps or low back pain.  Take warm sitz baths to soothe any pain or discomfort caused by hemorrhoids. Use hemorrhoid cream if your health care provider approves.  Wear a good support bra to prevent discomfort from breast tenderness.  If you develop varicose veins: ? Wear support pantyhose or compression stockings as told by your healthcare provider. ? Elevate your feet for 15 minutes, 3-4 times a day. Prenatal care  Write down your questions. Take them to your prenatal visits.  Keep all your prenatal visits as told by your health care provider. This is important. Safety  Wear your seat belt at all times when driving.  Make  a list of emergency phone numbers, including numbers for family, friends, the hospital, and police and fire departments. General instructions  Avoid cat litter boxes and soil used by cats. These carry germs that can cause birth defects in the baby. If you have a cat, ask someone to clean the litter box for you.  Do not travel far distances unless it is absolutely necessary and only with the approval of your health care provider.  Do not use hot tubs, steam rooms, or saunas.  Do not drink alcohol.  Do not use any products that contain nicotine or tobacco, such as cigarettes and e-cigarettes. If you need help quitting, ask your health care provider.  Do not use any medicinal herbs or unprescribed drugs. These chemicals affect the formation and growth of the baby.  Do not douche or use tampons or scented sanitary pads.  Do not cross your legs for long periods of time.  To prepare for the arrival of your baby: ? Take prenatal classes to understand, practice, and ask questions about labor and delivery. ? Make a trial run to the hospital. ? Visit the hospital and tour the maternity area. ? Arrange for maternity or paternity leave through employers. ? Arrange for family and friends to take care of pets while you are in the hospital. ? Purchase a rear-facing car seat and make sure you know how to install it in your car. ? Pack your hospital bag. ? Prepare the baby's nursery. Make sure to remove all pillows and stuffed animals from the baby's crib to prevent suffocation.  Visit your dentist if you have not gone during your pregnancy. Use a soft toothbrush to brush your teeth and be gentle when you floss. Contact a health care provider if:  You are unsure if you are in labor or if your water has broken.  You become dizzy.  You have mild pelvic cramps, pelvic pressure, or nagging pain in your abdominal area.  You have lower back pain.  You have persistent nausea, vomiting, or  diarrhea.  You have an unusual or bad smelling vaginal discharge.  You have pain when you urinate. Get help right away if:  Your water breaks before 37 weeks.  You have regular contractions less than 5 minutes apart before 37 weeks.  You have a fever.  You are leaking fluid from your vagina.  You have spotting or bleeding from your vagina.  You have severe abdominal pain or cramping.  You have rapid weight loss or weight gain.  You have   shortness of breath with chest pain.  You notice sudden or extreme swelling of your face, hands, ankles, feet, or legs.  Your baby makes fewer than 10 movements in 2 hours.  You have severe headaches that do not go away when you take medicine.  You have vision changes. Summary  The third trimester is from week 28 through week 40, months 7 through 9. The third trimester is a time when the unborn baby (fetus) is growing rapidly.  During the third trimester, your discomfort may increase as you and your baby continue to gain weight. You may have abdominal, leg, and back pain, sleeping problems, and an increased need to urinate.  During the third trimester your breasts will keep growing and they will continue to become tender. A yellow fluid (colostrum) may leak from your breasts. This is the first milk you are producing for your baby.  False labor is a condition in which you feel small, irregular tightenings of the muscles in the womb (contractions) that eventually go away. These are called Braxton Hicks contractions. Contractions may last for hours, days, or even weeks before true labor sets in.  Signs of labor can include: abdominal cramps; regular contractions that start at 10 minutes apart and become stronger and more frequent with time; watery or bloody mucus discharge that comes from the vagina; increased pelvic pressure and dull back pain; and leaking of amniotic fluid. This information is not intended to replace advice given to you by your  health care provider. Make sure you discuss any questions you have with your health care provider. Document Revised: 09/06/2018 Document Reviewed: 06/21/2016 Elsevier Patient Education  2020 [employer]  

## 2019-09-27 ENCOUNTER — Encounter: Payer: Medicaid Other | Admitting: Obstetrics and Gynecology

## 2019-10-02 ENCOUNTER — Other Ambulatory Visit: Payer: Self-pay

## 2019-10-02 ENCOUNTER — Encounter: Payer: Self-pay | Admitting: Advanced Practice Midwife

## 2019-10-02 ENCOUNTER — Ambulatory Visit (INDEPENDENT_AMBULATORY_CARE_PROVIDER_SITE_OTHER): Payer: Medicaid Other | Admitting: Advanced Practice Midwife

## 2019-10-02 VITALS — BP 118/68 | Wt 166.0 lb

## 2019-10-02 DIAGNOSIS — Z3A33 33 weeks gestation of pregnancy: Secondary | ICD-10-CM

## 2019-10-02 DIAGNOSIS — Z3403 Encounter for supervision of normal first pregnancy, third trimester: Secondary | ICD-10-CM

## 2019-10-02 LAB — POCT URINALYSIS DIPSTICK OB: Glucose, UA: NEGATIVE

## 2019-10-02 NOTE — Progress Notes (Signed)
Routine Prenatal Care Visit  Subjective  Carrie [name] is a 24 y.o. G1P0000 at [redacted]w[redacted]d being seen today for ongoing prenatal care.  She is currently monitored for the following issues for this low-risk pregnancy and has Perirectal abscess; Supervision of normal first pregnancy, antepartum; Decreased fetal movement affecting management of pregnancy in second trimester; and Round ligament pain on their problem list.  ----------------------------------------------------------------------------------- Patient reports "I can't stop smoking". She is referring to using marijuana usually 2 times per day to stimulate appetite. When she has tried to quit she has lost weight. We discussed recommendation to quit as there is no known safe amount of MJ in pregnancy and issues with postpartum and newborn screens. She is encouraged to quit and make it a point to eat regardless.    Contractions: Not present. Vag. Bleeding: None.  Movement: Present. Leaking Fluid denies.  ----------------------------------------------------------------------------------- The following portions of the patient's history were reviewed and updated as appropriate: allergies, current medications, past family history, past medical history, past social history, past surgical history and problem list. Problem list updated.  Objective  Blood pressure 118/68, weight 166 lb (75.3 kg), last menstrual period 02/12/2019. Pregravid weight 136 lb (61.7 kg) Total Weight Gain 30 lb (13.6 kg) Urinalysis: Urine Protein    Urine Glucose    Fetal Status: Fetal Heart Rate (bpm): 145 Fundal Height: 33 cm Movement: Present     General:  Alert, oriented and cooperative. Patient is in no acute distress.  Skin: Skin is warm and dry. No rash noted.   Cardiovascular: Normal heart rate noted  Respiratory: Normal respiratory effort, no problems with respiration noted  Abdomen: Soft, gravid, appropriate for gestational age. Pain/Pressure: Absent     Pelvic:   Cervical exam deferred        Extremities: Normal range of motion.  Edema: None  Mental Status: Normal mood and affect. Normal behavior. Normal judgment and thought content.   Assessment   23 y.o. G1P0000 at [redacted]w[redacted]d by  11/19/2019, by Last Menstrual Period presenting for routine prenatal visit  Plan   Pregnancy#1 Problems (from 02/12/19 to present)    Problem Noted Resolved   Supervision of normal first pregnancy, antepartum 03/28/2019 by Tresea Mall, CNM No   Overview Addendum 09/13/2019  9:14 AM by Tresea Mall, CNM    [facility] Prenatal Labs  Dating [place] 7 wks Blood type: A/Positive/-- (12/21 1544)   Genetic Screen NIPS: diploid XY Antibody:Negative (12/21 1544)  Anatomic [place] [facility] nml Rubella: 1.24 (12/21 1544) Varicella: NonImmune  GTT 28 week:  RPR: Non Reactive (12/21 1544)   Rhogam n/a HBsAg: Negative (12/21 1544)   Vaccines TDAP: 09/13/19                      Flu Shot: HIV: Non Reactive (12/21 1544)   Baby Food Breast                               GBS:   Contraception  Pap: 03/28/19 NIL  CBB  No   CS/VBAC n/a   Support Person Partner Francee Piccolo           MJ cessation encouraged   Preterm labor symptoms and general obstetric precautions including but not limited to vaginal bleeding, contractions, leaking of fluid and fetal movement were reviewed in detail with the patient.    Return in about 2 weeks (around 10/16/2019) for rob.  Tresea Mall, CNM 10/02/2019 11:26 AM

## 2019-10-07 ENCOUNTER — Telehealth: Payer: Self-pay

## 2019-10-07 NOTE — Telephone Encounter (Signed)
Spoke w/patient. She states the pains are in her pelvic area and sometimes feels like the round ligament pain she has had before. Advised of braxton hicks ctx normal at this point in her pregnancy. Advised on labor precautions, when to report to L&D for eval if having more than 4 true contraction per hour, leaking of fluid, vaginal bleeding. Advised on remaining well hydrating and resting when possible. Baby is moving further down into pelvis and may experience some discomfort with the pelvic bones beginning to shift/prepare for labor. Patient will monitor symptoms and call back with any further questions/concerns. Next rob 10/16/19

## 2019-10-07 NOTE — Telephone Encounter (Signed)
Patient states she is having abdominal pain for 2 days. It is becoming unbearable. EK#[phone]

## 2019-10-08 ENCOUNTER — Other Ambulatory Visit: Payer: Self-pay

## 2019-10-08 ENCOUNTER — Encounter: Payer: Self-pay | Admitting: Obstetrics and Gynecology

## 2019-10-08 ENCOUNTER — Observation Stay
Admission: EM | Admit: 2019-10-08 | Discharge: 2019-10-08 | Disposition: A | Discharge status: Home / Self Care | Payer: Medicaid Other | Attending: Obstetrics and Gynecology | Admitting: Obstetrics and Gynecology

## 2019-10-08 DIAGNOSIS — R109 Unspecified abdominal pain: Secondary | ICD-10-CM | POA: Diagnosis present

## 2019-10-08 DIAGNOSIS — O99891 Other specified diseases and conditions complicating pregnancy: Secondary | ICD-10-CM

## 2019-10-08 DIAGNOSIS — M2429 Disorder of ligament, other specified site: Secondary | ICD-10-CM

## 2019-10-08 DIAGNOSIS — Z3400 Encounter for supervision of normal first pregnancy, unspecified trimester: Secondary | ICD-10-CM

## 2019-10-08 DIAGNOSIS — O26899 Other specified pregnancy related conditions, unspecified trimester: Principal | ICD-10-CM | POA: Diagnosis present

## 2019-10-08 DIAGNOSIS — Z3A34 34 weeks gestation of pregnancy: Secondary | ICD-10-CM | POA: Insufficient documentation

## 2019-10-08 LAB — URINALYSIS, ROUTINE W REFLEX MICROSCOPIC
Bilirubin Urine: NEGATIVE
Glucose, UA: NEGATIVE mg/dL
Hgb urine dipstick: NEGATIVE
Ketones, ur: NEGATIVE mg/dL
Leukocytes,Ua: NEGATIVE
Nitrite: NEGATIVE
Protein, ur: NEGATIVE mg/dL
Specific Gravity, Urine: 1.019 (ref 1.005–1.030)
pH: 8 (ref 5.0–8.0)

## 2019-10-08 MED ORDER — ACETAMINOPHEN 325 MG PO TABS: 650.0000 mg | ORAL_TABLET | ORAL | Status: DC | PRN

## 2019-10-08 NOTE — Discharge Summary (Signed)
See final progress note.  Mirna Mires, CNM  10/08/2019 1:17 PM

## 2019-10-08 NOTE — Assessment & Plan Note (Signed)
Seen 10/08/2019 with c/o round ligament pain. Cervix closed.

## 2019-10-08 NOTE — Final Progress Note (Signed)
Physician Final Progress Note  Patient ID: Carrie [name] MRN: [id number] DOB/AGE: [birth year] 23 y.o.  Admit date: 10/08/2019 Admitting provider: Mirna Mires, CNM Discharge date: 10/08/2019   Admission Diagnoses: IUP 34 weeks, lower bilateral abdominal pain  Discharge Diagnoses:  Active Problems:   Abdominal pain affecting pregnancy, antepartum  Round ligament pain  Consults: None  Significant Findings/ Diagnostic Studies: urinalysis: negative for nitrites, protein, glucose or leuks  Procedures: NST  Discharge Condition: good  Disposition:  There are no questions and answers to display.        Diet: Regular diet  Discharge Activity: Activity as tolerated   Allergies as of 10/08/2019   No Known Allergies   Med Rec must be completed prior to using this [facility]        Total time spent taking care of this patient: 35 minutes  Signed: Mirna Mires 10/08/2019, 12:43 PM

## 2019-10-08 NOTE — Progress Notes (Signed)
Pt provided discharge instructions. Gave pt opportunity to ask questions, no questions at this time. Pt discharged home in stable condition.

## 2019-10-08 NOTE — OB Triage Note (Signed)
Pt arrived to [facility] complaining of abdominal pressure that began a few days prior.She rates the pain/discomfort 6/10. She describes it as intermittent with movement or certain changes in position. The pain is sharp with movement and dull at rest. Pt denies vaginal bleeding and discharge. Pt. States no urinary pain, but pressure at end of stream. Pt states positive FM. Monitors applied, initial FHT 145. VSS.

## 2019-10-16 ENCOUNTER — Ambulatory Visit (INDEPENDENT_AMBULATORY_CARE_PROVIDER_SITE_OTHER): Payer: Medicaid Other | Admitting: Certified Nurse Midwife

## 2019-10-16 ENCOUNTER — Other Ambulatory Visit: Payer: Self-pay

## 2019-10-16 VITALS — BP 118/78 | Wt 169.0 lb

## 2019-10-16 DIAGNOSIS — O321XX0 Maternal care for breech presentation, not applicable or unspecified: Secondary | ICD-10-CM | POA: Insufficient documentation

## 2019-10-16 DIAGNOSIS — Z3400 Encounter for supervision of normal first pregnancy, unspecified trimester: Secondary | ICD-10-CM

## 2019-10-16 DIAGNOSIS — Z3A35 35 weeks gestation of pregnancy: Secondary | ICD-10-CM

## 2019-10-16 LAB — POCT URINALYSIS DIPSTICK OB
Glucose, UA: NEGATIVE
POC,PROTEIN,UA: NEGATIVE

## 2019-10-16 NOTE — Patient Instructions (Signed)
Breech Birth A breech birth is when a baby is born with the buttocks or feet first. Most babies are in a head down (vertex) position when they are born. There are three types of breech babies:  When the baby's buttocks are showing first in the vagina (birth canal) with the legs bent at the knees and the feet down near the buttocks (complete breech).  When the baby's buttocks are showing first in the birth canal with the legs straight up and the feet at the baby's head (frank breech).  When one or both of the baby's feet are showing first in the birth canal along with the buttocks (footling breech). What are the health risks of having a breech birth? Having a breech birth increases the health risks to your baby. A breech birth may cause the following:  Umbilical cord prolapse. This is when the umbilical cord enters the birth canal ahead of the baby, before or during labor. This can cause the cord to become pinched or compressed as labor continues. This can reduce the flow of blood and oxygen to the baby.  The baby getting stuck in the birth canal, which can cause injury or, rarely, death.  Injury to the baby's nerves in the shoulder, arm, and hand (brachial plexus injury) when delivered. What increases the risk of having a breech baby? It is not known what causes your baby to be breech. However, you are more likely to have a breech baby if:  You have had a previous pregnancy.  You are having more than one baby.  Your baby has certain birth (congenital) defects.  You have started your labor earlier than expected (premature labor).  You have problems with your uterus, such as a tumor or abnormally shaped uterus.  You have too much or not enough fluid surrounding the baby (amniotic fluid). How do I know if my baby is breech? There are no symptoms for you to know that your baby is breech. When you are close to your due date, your health care provider can tell if your baby is breech by  doing:  An abdominal or vaginal (pelvic) exam.  An ultrasound. What can be done if my baby is breech?  Your health care provider may try to turn the baby in your uterus. He or she will use a procedure called external cephalic version (ECV). He or she will place both hands on your abdomen and gently and slowly turn the baby around. It is important to know that ECV can increase your chances of suddenly going into labor. For this reason, an ECV is only done toward the end of a healthy pregnancy. The baby may remain in this position, but sometimes he or she may turn back to the breech position. You and your health care provider will discuss if an ECV is recommended for you and your baby. How will I delivery my baby if he or she is breech? You and your health care provider will discuss the best way to deliver your baby. If your baby is breech, it is less likely that a vaginal delivery will be recommended due to the risks to you and your baby. Your health care provider may recommend that you deliver your baby through a Cesarean section (C-section). A C-section is the surgical delivery of a baby through an incision in the abdomen and the uterus. Summary  A breech birth is when a baby is born with the buttocks or feet first.  Having a breech birth may   increase the risks to your baby.  Your health care provider may try to turn your baby in your uterus using a procedure called an external cephalic version (ECV).  If your baby cannot be turned to a head down position or if your baby remains in a breech position, your health care provider will make recommendations about the safest way to deliver your baby. This information is not intended to replace advice given to you by your health care provider. Make sure you discuss any questions you have with your health care provider. Document Revised: 04/28/2017 Document Reviewed: 02/09/2017 Elsevier Patient Education  2020 Elsevier Inc.  

## 2019-10-20 NOTE — Progress Notes (Signed)
ROB at 35.1 weeks: Doing well. Baby active. Breast feeding. Denies vaginal bleeding, regular contractions and leakage of fluid  Exam: FH 36cm FHT 138 BP 118/78 Breech on Leopold's and frank breech presentation confirmed on ultrasound  A: IUP at 35wk1d Frank breech presentation  P: Discussed pain relief in labor The following were addressed during this visit:  Breastfeeding Education - Early initiation of breastfeeding    Comments: Keeps milk supply adequate, helps contract uterus and slow bleeding, and early milk is the perfect first food and is easy to digest.   - The importance of early skin-to-skin contact    Comments: Keeps baby warm and secure, helps keep baby's blood sugar up and breathing steady, easier to bond and breastfeed, and helps calm baby.  - Feeding on demand or baby-led feeding    Comments: Helps prevent breastfeeding complications, helps bring in good milk supply, prevents under or overfeeding, and helps baby feel content and satisfied   - Frequent feeding to help assure optimal milk production    Comments: Making a full supply of milk requires frequent removal of milk from breasts, infant will eat 8-12 times in 24 hours, if separated from infant use breast massage, hand expression and/ or pumping to remove milk from breasts.  Discussed obtaining a ultrasound  for presentation and AFI and ROB in 1 week Discussed increased risk to baby with vaginal breech delivery. Explained external version risks and benefits as well as Cesarean section  34 week instructions  Farrel Conners, CNM

## 2019-10-23 ENCOUNTER — Ambulatory Visit (INDEPENDENT_AMBULATORY_CARE_PROVIDER_SITE_OTHER): Payer: Medicaid Other

## 2019-10-23 ENCOUNTER — Other Ambulatory Visit: Payer: Self-pay

## 2019-10-23 ENCOUNTER — Encounter: Payer: Self-pay | Admitting: Obstetrics and Gynecology

## 2019-10-23 ENCOUNTER — Ambulatory Visit (INDEPENDENT_AMBULATORY_CARE_PROVIDER_SITE_OTHER): Payer: Medicaid Other | Admitting: Obstetrics and Gynecology

## 2019-10-23 ENCOUNTER — Other Ambulatory Visit (HOSPITAL_COMMUNITY)
Admission: RE | Admit: 2019-10-23 | Discharge: 2019-10-23 | Disposition: A | Discharge status: Home / Self Care | Payer: Medicaid Other | Source: Ambulatory Visit | Attending: Obstetrics and Gynecology | Admitting: Obstetrics and Gynecology

## 2019-10-23 VITALS — BP 122/70 | Wt 174.0 lb

## 2019-10-23 DIAGNOSIS — Z3400 Encounter for supervision of normal first pregnancy, unspecified trimester: Secondary | ICD-10-CM | POA: Insufficient documentation

## 2019-10-23 DIAGNOSIS — Z3A36 36 weeks gestation of pregnancy: Secondary | ICD-10-CM

## 2019-10-23 DIAGNOSIS — O321XX0 Maternal care for breech presentation, not applicable or unspecified: Secondary | ICD-10-CM | POA: Diagnosis not present

## 2019-10-23 NOTE — Progress Notes (Signed)
  Routine Prenatal Care Visit  Subjective  Carrie [name] is a 24 y.o. G1P0000 at [redacted]w[redacted]d being seen today for ongoing prenatal care.  She is currently monitored for the following issues for this high-risk pregnancy and has Perirectal abscess; Supervision of normal first pregnancy, antepartum; Decreased fetal movement affecting management of pregnancy in second trimester; Round ligament pain; Abdominal pain affecting pregnancy, antepartum; and Breech presentation on their problem list.  ----------------------------------------------------------------------------------- Patient reports no complaints.    . Vag. Bleeding: None.  Movement: Present. Leaking Fluid denies.  U/S shows normal fluid, fetus still frank breech ----------------------------------------------------------------------------------- The following portions of the patient's history were reviewed and updated as appropriate: allergies, current medications, past family history, past medical history, past social history, past surgical history and problem list. Problem list updated.  Objective  Blood pressure 122/70, weight 174 lb (78.9 kg), last menstrual period 02/12/2019. Pregravid weight 136 lb (61.7 kg) Total Weight Gain 38 lb (17.2 kg) Urinalysis: Urine Protein    Urine Glucose    Fetal Status: Fetal Heart Rate (bpm): 144   Movement: Present  Presentation: Homero Fellers Breech  General:  Alert, oriented and cooperative. Patient is in no acute distress.  Skin: Skin is warm and dry. No rash noted.   Cardiovascular: Normal heart rate noted  Respiratory: Normal respiratory effort, no problems with respiration noted  Abdomen: Soft, gravid, appropriate for gestational age. Pain/Pressure: Present     Pelvic:  Cervical exam deferred        Extremities: Normal range of motion.  Edema: None  Mental Status: Normal mood and affect. Normal behavior. Normal judgment and thought content.   Assessment   24 y.o. G1P0000 at [redacted]w[redacted]d by  11/19/2019, by  Last Menstrual Period presenting for routine prenatal visit  Plan   Pregnancy#1 Problems (from 02/12/19 to present)    Problem Noted Resolved   Supervision of normal first pregnancy, antepartum 03/28/2019 by Tresea Mall, CNM No   Overview Addendum 10/16/2019  9:28 AM by Farrel Conners, CNM    Clinic [facility] Prenatal Labs  Dating [place] 7 wks Blood type: A/Positive/-- (12/21 1544)   Genetic Screen NIPS: diploid XY Antibody:Negative (12/21 1544)  Anatomic [place] [facility] nml Rubella: 1.24 (12/21 1544) Varicella: NonImmune  GTT 28 week: 130 RPR: Non Reactive (12/21 1544)   Rhogam n/a HBsAg: Negative (12/21 1544)   Vaccines TDAP: 09/13/19                      Flu Shot: HIV: Non Reactive (12/21 1544)   Baby Food Breast                               GBS:   Contraception  Pap: 03/28/19 NIL  CBB  No   CS/VBAC n/a   Support Person Partner Derek              Preterm labor symptoms and general obstetric precautions including but not limited to vaginal bleeding, contractions, leaking of fluid and fetal movement were reviewed in detail with the patient. Please refer to After Visit Summary for other counseling recommendations.   - GBS/Aptima today - long conversation regarding ECV vs c-section directly.  She would like to attempt ECV.    Return in about 1 week (around 10/30/2019) for Routine Prenatal Appointment.  Thomasene Mohair, MD, [facility], [facility] 10/23/2019 10:10 AM

## 2019-10-24 ENCOUNTER — Telehealth: Payer: Self-pay | Admitting: Obstetrics and Gynecology

## 2019-10-24 LAB — CERVICOVAGINAL ANCILLARY ONLY
Chlamydia: NEGATIVE
Comment: NEGATIVE
Comment: NORMAL
Neisseria Gonorrhea: NEGATIVE

## 2019-10-24 NOTE — Telephone Encounter (Signed)
-----   Message from Conard Novak, MD sent at 10/23/2019 10:16 AM EDT ----- Regarding: Schedule surgery Surgery Booking Request Patient Full Name:  Carrie [name]  MRN: [id number]  DOB: [birth year]  Surgeon: Thomasene Mohair, MD  Requested Surgery Date and Time: 11/12/2019 Primary Diagnosis AND Code: Breech presentation [O32.1XX0] Secondary Diagnosis and Code:  Surgical Procedure: Cesarean Section L&D Notification: Yes Admission Status: surgery admit Length of Surgery: 60 minutes Special Case Needs: No H&P: Yes Phone Interview???:  Yes Interpreter: No Language:  Medical Clearance:  No Special Scheduling Instructions: none Any known health/anesthesia issues, diabetes, sleep apnea, latex allergy, defibrillator/pacemaker?: No Acuity: P2   (P1 highest, P2 delay may cause harm, P3 low, elective gyn, P4 lowest)

## 2019-10-24 NOTE — Telephone Encounter (Signed)
Called to schedule C/S with Britt Boozer 6/15  H&P 6/8 @ 8:30    Covid testing 6/14 @ 9-10am, Medical [address], drive up and wear mask. Advised pt to quarantine until DOS.  Pre-admit phone call appointment to be requested - date and time will be included on H&P paper work. Also all appointments will be updated on pt MyChart. Explained that this appointment has a call window. Based on the time scheduled will indicate if the call will be received within a 4 hour window before 1:00 or after.  Advised that pt may also receive calls from the hospital pharmacy and pre-service center.  Confirmed pt has Medicaid as primary insurance. No secondary insurance.  Pt coming in on 6/2 and seeing Ottis Stain - said they were trying to turn baby. Asked if c/s would just be canceled if they are successful. I adv that if baby turns and is positioned then most likely they will allow for natural birth but the providers will make that call.

## 2019-10-25 LAB — STREP GP B NAA: Strep Gp B NAA: NEGATIVE

## 2019-10-29 ENCOUNTER — Observation Stay
Admission: EM | Admit: 2019-10-29 | Discharge: 2019-10-29 | Disposition: A | Discharge status: Home / Self Care | Payer: Medicaid Other | Attending: Obstetrics and Gynecology | Admitting: Obstetrics and Gynecology

## 2019-10-29 DIAGNOSIS — O321XX0 Maternal care for breech presentation, not applicable or unspecified: Principal | ICD-10-CM | POA: Diagnosis present

## 2019-10-29 DIAGNOSIS — Z3A37 37 weeks gestation of pregnancy: Secondary | ICD-10-CM | POA: Insufficient documentation

## 2019-10-29 MED ORDER — ACETAMINOPHEN 325 MG PO TABS: 650.0000 mg | ORAL_TABLET | ORAL | Status: DC | PRN

## 2019-10-29 MED ORDER — TERBUTALINE SULFATE 1 MG/ML IJ SOLN
INTRAMUSCULAR | Status: AC
  Administered 2019-10-29: 0.25 mg
  Filled 2019-10-29: qty 1

## 2019-10-29 NOTE — Discharge Instructions (Signed)
[email or web address]

## 2019-10-29 NOTE — Discharge Summary (Signed)
Physician Final Progress Note  Patient ID: Carrie [name] MRN: [id number] DOB/AGE: 23/27/1997 22 y.o.  Admit date: 10/29/2019 Admitting provider: Vena Austria, MD Discharge date: 10/29/2019   Admission Diagnoses: Breech presentation  Discharge Diagnoses:  Active Problems:   Breech presentation  Consults: None  23 y.o. G1P1001 at [redacted] week gestation presenting for attempted ECV secondary to breech presentation.  Procedure unsuccessful.  Fetus monitored for 1-hr post procedure with reactive NST.  +FM, no LOF, no VB, no ctx  Significant Findings/ Diagnostic Studies: none  Procedures:  Baseline: 135 Variability: moderate Accelerations: present Decelerations: absent Tocometry: none The patient was monitored for 30 minutes, fetal heart rate tracing was deemed reactive, category I tracing,  ECV - unsuccessful    Discharge Condition: good  Disposition:   Diet: Regular diet  Discharge Activity: Activity as tolerated   Total time spent taking care of this patient: 45 minutes  Signed: Vena Austria 10/29/2019, 1:31 PM

## 2019-10-29 NOTE — Procedures (Addendum)
  Patient presented to L&D for scheduled external cephalic version.  Reactive NST pre-procedure.  Ultrasound confirmed continued breech presentation, posterior placenta, and AFI of 12.6cm.  The fetal head was in the maternal left upper quadrant with spine facing left as well.  The patient was administered terbutaline.  The breech was elevated out of the pelvis, and two attempts and rotating the fetus counterclockwise were unsuccessful.  And additional attempt was undertaken to rotate the fetus clockwise which also was not successful.  Attempt at ECV was discontinued at this time.  The patient tolerated the procedure well.  The fetus will be monitored for 1-hr post procedure to ensure reactive NST. She is scheduled for C-section on 11/12/2019 with Dr. Jean Rosenthal.

## 2019-10-29 NOTE — H&P (Signed)
Obstetric H&P   Chief Complaint: Breech  Prenatal Care Provider: [facility]  History of Present Illness: 24 y.o. G1P0000 [redacted]w[redacted]d by 11/19/2019, by Last Menstrual Period presenting to L&D for scheduled ECV. +FM, no LOF, no VB, no ctx.  Pregnancy thus far uncomplicated other than breech presentation.     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 Antibody:Negative (12/21 1544)  Anatomic [place] [facility] nml Rubella: 1.24 (12/21 1544) Varicella: NonImmune  GTT 28 week: 130 RPR: Non Reactive (12/21 1544)   Rhogam n/a HBsAg: Negative (12/21 1544)   Vaccines TDAP: 09/13/19                      Flu Shot: HIV: Non Reactive (12/21 1544)   Baby Food Breast                               GBS:   Contraception  Pap: 03/28/19 NIL  CBB  No   CS/VBAC n/a   Support Person Partner Derek              Review of Systems: 10 point review of systems negative unless otherwise noted in HPI  Past Medical History: Patient Active Problem List   Diagnosis Date Noted  . Breech presentation 10/16/2019  . Abdominal pain affecting pregnancy, antepartum 10/08/2019  . Decreased fetal movement affecting management of pregnancy in second trimester 08/21/2019  . Round ligament pain 08/21/2019  . Supervision of normal first pregnancy, antepartum 03/28/2019    Clinic [facility] Prenatal Labs  Dating [place] 7 wks Blood type: A/Positive/-- (12/21 1544)   Genetic Screen NIPS: diploid XY Antibody:Negative (12/21 1544)  Anatomic [place] [facility] nml Rubella: 1.24 (12/21 1544) Varicella: NonImmune  GTT 28 week: 130 RPR: Non Reactive (12/21 1544)   Rhogam n/a HBsAg: Negative (12/21 1544)     Vaccines TDAP: 09/13/19                      Flu Shot: HIV: Non Reactive (12/21 1544)   Baby Food Breast                               GBS:   Contraception  Pap: 03/28/19 NIL  CBB  No   CS/VBAC n/a   Support Person Partner Derek       . Perirectal abscess     Past Surgical History: Past Surgical History:  Procedure Laterality Date  . INCISION AND DRAINAGE PERIRECTAL ABSCESS N/A 01/21/2016   Procedure: IRRIGATION AND DEBRIDEMENT PERIRECTAL ABSCESS;  Surgeon: Jules Husbands, MD;  Location: [facility] ORS;  Service: General;  Laterality: N/A;    Past Obstetric History: # 1 - Date: None, Sex: None, Weight: None, GA: None, Delivery: None, Apgar1: None, Apgar5: None, Living: None, Birth Comments: None   Family History: Family History  Problem Relation Age of Onset  . Hypertension Mother   . Cancer Maternal Aunt   . Heart disease Paternal Grandmother     Social History: Social History  Socioeconomic History  . Marital status: Single    Spouse name: Not on file  . Number of children: Not on file  . Years of education: Not on file  . Highest education level: Not on file  Occupational History  . Occupation: Unemployed  Tobacco Use  . Smoking status: Former Smoker    Packs/day: 0.25    Types: Cigarettes  . Smokeless tobacco: Never Used  Substance and Sexual Activity  . Alcohol use: No  . Drug use: Yes    Frequency: 14.0 times per week    Types: Marijuana    Comment: last use 3/22  . Sexual activity: Yes    Birth control/protection: None    Comment: undecided   Other Topics Concern  . Not on file  Social History Narrative  . Not on file   Social Determinants of Health   Financial Resource Strain:   . Difficulty of Paying Living Expenses:   Food Insecurity:   . Worried About Programme researcher, broadcasting/film/video in the Last Year:   . Barista in the Last Year:   Transportation Needs:   . Freight forwarder (Medical):   Marland Kitchen Lack of Transportation (Non-Medical):   Physical  Activity:   . Days of Exercise per Week:   . Minutes of Exercise per Session:   Stress:   . Feeling of Stress :   Social Connections:   . Frequency of Communication with Friends and Family:   . Frequency of Social Gatherings with Friends and Family:   . Attends Religious Services:   . Active Member of Clubs or Organizations:   . Attends Banker Meetings:   Marland Kitchen Marital Status:   Intimate Partner Violence:   . Fear of Current or Ex-Partner:   . Emotionally Abused:   Marland Kitchen Physically Abused:   . Sexually Abused:     Medications: Prior to Admission medications   Medication Sig Start Date End Date Taking? Authorizing Provider  acetaminophen (TYLENOL) 500 MG tablet Take 500-1,000 mg by mouth every 6 (six) hours as needed (for pain.).    [provider]  Prenat w/o A-[id number] (CITRANATAL ASSURE) 35-1 & 300 MG tablet Take 2 tablets by mouth daily. Patient taking differently: Take 2 tablets by mouth at bedtime.  07/08/19   Nadara Mustard, MD    Allergies: No Known Allergies  Physical Exam: Vitals: Blood pressure 126/73, pulse 77, resp. rate 20, height 5\' 2"  (1.575 m), weight 78 kg, last menstrual period 02/12/2019.  Urine Dip Protein: N/A  FHT: 140, moderate, +accels, no decels Toco: none  General: NAD HEENT: normocephalic, anicteric Pulmonary: No increased work of breathing Cardiovascular: RRR, distal pulses 2+ Abdomen: Gravid, non-tender Genitourinary: deferred Extremities: no edema, erythema, or tenderness Neurologic: Grossly intact Psychiatric: mood appropriate, affect full  Labs: No results found for this or any previous visit (from the past 24 hour(s)).  Assessment: 24 y.o. G1P0000 [redacted]w[redacted]d by 11/19/2019, by Last Menstrual Period in breech presentation  Plan: 1)Breech - confirmed breech presentation on ultrasound AFI 12.6cm.  Disused process of ECV, risk and beneftis.  Patient agreeable to proceed with attempt at ECV  2) Fetus - cat I  tracing  3) PNL - Blood type A/Positive/-- (12/21 1544) / Anti-bodyscreen Negative (12/21 1544) / Rubella 1.24 (12/21 1544) / Varicella Non-Immune / RPR Non Reactive (04/01 0953) / HBsAg Negative (12/21 1544) / HIV Non Reactive (04/01 0953) / 1-hr OGTT 130 / GBS Negative/-- (05/26 1251)  4) Immunization History -  Immunization History  Administered Date(s) Administered  . Tdap 09/13/2019    5) Disposition -   Vena Austria, MD, [facility], [facility] 10/29/2019, 1:08 PM

## 2019-10-30 ENCOUNTER — Other Ambulatory Visit: Payer: Self-pay

## 2019-10-30 ENCOUNTER — Ambulatory Visit (INDEPENDENT_AMBULATORY_CARE_PROVIDER_SITE_OTHER): Payer: Medicaid Other | Admitting: Advanced Practice Midwife

## 2019-10-30 ENCOUNTER — Encounter: Payer: Self-pay | Admitting: Advanced Practice Midwife

## 2019-10-30 VITALS — BP 120/74 | Wt 174.0 lb

## 2019-10-30 DIAGNOSIS — Z3A37 37 weeks gestation of pregnancy: Secondary | ICD-10-CM

## 2019-10-30 DIAGNOSIS — Z3403 Encounter for supervision of normal first pregnancy, third trimester: Secondary | ICD-10-CM

## 2019-10-30 NOTE — Progress Notes (Signed)
  Routine Prenatal Care Visit  Subjective  Carrie [name] is a 24 y.o. G1P0000 at [redacted]w[redacted]d being seen today for ongoing prenatal care.  She is currently monitored for the following issues for this low-risk pregnancy and has Perirectal abscess; Supervision of normal first pregnancy, antepartum; Decreased fetal movement affecting management of pregnancy in second trimester; Round ligament pain; Abdominal pain affecting pregnancy, antepartum; and Breech presentation on their problem list.  ----------------------------------------------------------------------------------- Patient reports she has some pressure in her lower pelvis. She denies contractions. She has been unable to quit smoking MJ. She has appointments set up for the next 2 weeks.   Contractions: Not present. Vag. Bleeding: None.  Movement: Present. Leaking Fluid denies.  ----------------------------------------------------------------------------------- The following portions of the patient's history were reviewed and updated as appropriate: allergies, current medications, past family history, past medical history, past social history, past surgical history and problem list. Problem list updated.  Objective  Blood pressure 120/74, weight 174 lb (78.9 kg), last menstrual period 02/12/2019. Pregravid weight 136 lb (61.7 kg) Total Weight Gain 38 lb (17.2 kg) Urinalysis: Urine Protein    Urine Glucose    Fetal Status: Fetal Heart Rate (bpm): 142 Fundal Height: 37 cm Movement: Present  Presentation: Homero Fellers Breech  General:  Alert, oriented and cooperative. Patient is in no acute distress.  Skin: Skin is warm and dry. No rash noted.   Cardiovascular: Normal heart rate noted  Respiratory: Normal respiratory effort, no problems with respiration noted  Abdomen: Soft, gravid, appropriate for gestational age. Pain/Pressure: Present     Pelvic:  Cervical exam deferred        Extremities: Normal range of motion.  Edema: None  Mental Status: Normal  mood and affect. Normal behavior. Normal judgment and thought content.   Assessment   23 y.o. G1P0000 at [redacted]w[redacted]d by  11/19/2019, by Last Menstrual Period presenting for routine prenatal visit  Plan   Pregnancy#1 Problems (from 02/12/19 to present)    Problem Noted Resolved   Supervision of normal first pregnancy, antepartum 03/28/2019 by Tresea Mall, CNM No   Overview Addendum 10/16/2019  9:28 AM by Farrel Conners, CNM    Clinic [facility] Prenatal Labs  Dating [place] 7 wks Blood type: A/Positive/-- (12/21 1544)   Genetic Screen NIPS: diploid XY Antibody:Negative (12/21 1544)  Anatomic [place] [facility] nml Rubella: 1.24 (12/21 1544) Varicella: NonImmune  GTT 28 week: 130 RPR: Non Reactive (12/21 1544)   Rhogam n/a HBsAg: Negative (12/21 1544)   Vaccines TDAP: 09/13/19                      Flu Shot: HIV: Non Reactive (12/21 1544)   Baby Food Breast                               GBS:   Contraception  Pap: 03/28/19 NIL  CBB  No   CS/VBAC n/a   Support Person Partner Derek              Term labor symptoms and general obstetric precautions including but not limited to vaginal bleeding, contractions, leaking of fluid and fetal movement were reviewed in detail with the patient.   Return in about 1 week (around 11/06/2019) for rob.  Tresea Mall, CNM 10/30/2019 4:17 PM

## 2019-10-30 NOTE — Progress Notes (Signed)
No vb. No lof. Pt having a lot of pressure 

## 2019-11-05 ENCOUNTER — Ambulatory Visit (INDEPENDENT_AMBULATORY_CARE_PROVIDER_SITE_OTHER): Payer: Medicaid Other | Admitting: Obstetrics and Gynecology

## 2019-11-05 ENCOUNTER — Other Ambulatory Visit: Payer: Self-pay

## 2019-11-05 ENCOUNTER — Encounter: Payer: Self-pay | Admitting: Obstetrics and Gynecology

## 2019-11-05 VITALS — BP 122/74 | Ht 62.0 in | Wt 178.0 lb

## 2019-11-05 DIAGNOSIS — O321XX0 Maternal care for breech presentation, not applicable or unspecified: Secondary | ICD-10-CM

## 2019-11-05 DIAGNOSIS — Z3A38 38 weeks gestation of pregnancy: Secondary | ICD-10-CM

## 2019-11-05 DIAGNOSIS — Z3400 Encounter for supervision of normal first pregnancy, unspecified trimester: Secondary | ICD-10-CM

## 2019-11-05 NOTE — Progress Notes (Signed)
OB History & Physical   History of Present Illness:  Chief Complaint: here for pre-operative visit for c-section due to breech presentation  HPI:  Carrie [name] is a 24 y.o. G1P0000 female at [redacted]w[redacted]d dated by LMP consistent with 7 week ultrasound.  Her pregnancy has been complicated by breech presentation, history of marijuana use.    She denies contractions.   She denies leakage of fluid.   She denies vaginal bleeding.   She reports fetal movement.    Total weight gain for pregnancy: 38 lb (17.2 kg)   Obstetrical Problem List: Pregnancy#1 Problems (from 02/12/19 to present)    Problem Noted Resolved   Supervision of normal first pregnancy, antepartum 03/28/2019 by Rod Can, CNM No   Overview Addendum 10/16/2019  9:28 AM by Dalia Heading, [place] Prenatal Labs  Dating [place] 7 wks Blood type: A/Positive/-- (12/21 1544)   Genetic Screen NIPS: diploid XY Antibody:Negative (12/21 1544)  Anatomic [place] [facility] nml Rubella: 1.24 (12/21 1544) Varicella: NonImmune  GTT 28 week: 130 RPR: Non Reactive (12/21 1544)   Rhogam n/a HBsAg: Negative (12/21 1544)   Vaccines TDAP: 09/13/19                      Flu Shot: HIV: Non Reactive (12/21 1544)   Baby Food Breast                               GBS:   Contraception  Pap: 03/28/19 NIL  CBB  No   CS/VBAC n/a   Support Person Partner Derek              Maternal Medical History:   Past Medical History:  Diagnosis Date   Headache     Past Surgical History:  Procedure Laterality Date   INCISION AND DRAINAGE PERIRECTAL ABSCESS N/A 01/21/2016   Procedure: IRRIGATION AND DEBRIDEMENT PERIRECTAL ABSCESS;  Surgeon: Jules Husbands, MD;  Location: [facility] ORS;  Service: General;  Laterality: N/A;    No Known Allergies  Prior to Admission medications   Medication Sig Start Date End Date Taking? Authorizing Provider  acetaminophen (TYLENOL) 500 MG tablet Take 500-1,000 mg by mouth every 6 (six) hours as needed (for pain.).     [provider]  Prenat w/o A-FeCbGl-DSS-FA-DHA (CITRANATAL ASSURE) 35-1 & 300 MG tablet Take 2 tablets by mouth daily. Patient taking differently: Take 2 tablets by mouth at bedtime.  07/08/19   Gae Dry, MD    OB History  Gravida Para Term Preterm AB Living  1 0 0 0 0 0  SAB TAB Ectopic Multiple Live Births  0 0 0 0      # Outcome Date GA Lbr Len/2nd Weight Sex Delivery Anes PTL Lv  1 Current             Prenatal care site: [facility]  Social History: She  reports that she has quit smoking. Her smoking use included cigarettes. She smoked 0.25 packs per day. She has never used smokeless tobacco. She reports current drug use. Frequency: 14.00 times per week. Drug: Marijuana. She reports that she does not drink alcohol.  Family History: family history includes Cancer in her maternal aunt; Heart disease in her paternal grandmother; Hypertension in her mother.   Review of Systems:  Review of Systems  Constitutional: Negative.   HENT: Negative.   Eyes: Negative.   Respiratory: Negative.  Cardiovascular: Negative.   Gastrointestinal: Negative.   Genitourinary: Negative.   Musculoskeletal: Negative.   Skin: Negative.   Neurological: Negative.   Psychiatric/Behavioral: Negative.      Physical Exam:  BP 122/74    Ht 5\' 2"  (1.575 m)    Wt 178 lb (80.7 kg)    LMP 02/12/2019 (Exact Date)    BMI 32.56 kg/m   Physical Exam Constitutional:      General: She is not in acute distress.    Appearance: Normal appearance. She is well-developed.  HENT:     Head: Normocephalic and atraumatic.  Eyes:     General: No scleral icterus.    Conjunctiva/sclera: Conjunctivae normal.  Cardiovascular:     Rate and Rhythm: Normal rate and regular rhythm.     Heart sounds: No murmur. No friction rub. No gallop.   Pulmonary:     Effort: Pulmonary effort is normal. No respiratory distress.     Breath sounds: Normal breath sounds. No wheezing or rales.  Abdominal:     General:  Bowel sounds are normal. There is no distension.     Palpations: Abdomen is soft.     Tenderness: There is no abdominal tenderness. There is no guarding or rebound.     Comments: Gravid, NT  Musculoskeletal:        General: Normal range of motion.     Cervical back: Normal range of motion and neck supple.  Neurological:     General: No focal deficit present.     Mental Status: She is alert and oriented to person, place, and time.     Cranial Nerves: No cranial nerve deficit.  Skin:    General: Skin is warm and dry.     Findings: No erythema.  Psychiatric:        Mood and Affect: Mood normal.        Behavior: Behavior normal.        Judgment: Judgment normal.   FHR: 145 bpm  Assessment:  Charo L Pint is a 24 y.o. G1P0000 female at [redacted]w[redacted]d with a fetus in breech presentation. She is here for her pre-operative appointment for her surgery scheduled for 11/12/2019.   Plan:  1. Admit to Labor & Delivery  2. CBC, T&S, NPO, IVF 3. Consents reviewed and signed.  All questions answered.    11/14/2019, MD 11/05/2019 9:13 AM

## 2019-11-05 NOTE — H&P (View-Only) (Signed)
OB History & Physical  ° °History of Present Illness:  °Chief Complaint: here for pre-operative visit for c-section due to breech presentation ° °HPI:  °Carrie [name] is a 23 y.o. G1P0000 female at [redacted]w[redacted]d dated by LMP consistent with 7 week ultrasound.  Her pregnancy has been complicated by breech presentation, history of marijuana use.   ° °She denies contractions.   She denies leakage of fluid.   She denies vaginal bleeding.   She reports fetal movement.   ° °Total weight gain for pregnancy: 38 lb (17.2 kg)  ° °Obstetrical Problem List: °Pregnancy#1 Problems (from 02/12/19 to present)   ° Problem Noted Resolved  ° Supervision of normal first pregnancy, antepartum 03/28/2019 by Gledhill, Jane, CNM No  ° Overview Addendum 10/16/2019  9:28 AM by Gutierrez, Colleen, CNM  °  Clinic [facility]renatal [facility]  °Dating [place] 7 wks Blood type: A/Positive/-- (12/21 1544)   °Genetic Screen NIPS: diploid XY Antibody:Negative (12/21 1544)  °Anatomic [facility] nml Rubella: 1.24 (12/21 1544) Varicella: NonImmune  °GTT 28 week: 130 RPR: Non Reactive (12/21 1544)   °Rhogam n/a HBsAg: Negative (12/21 1544)   °Vaccines TDAP: 09/13/19                      °Flu Shot: HIV: Non Reactive (12/21 1544)   °Baby Food Breast                               GBS:   °Contraception  Pap: 03/28/19 NIL  °CBB  No   °CS/VBAC n/a   °Support Person Partner Derek   ° ° °  °  °  °  ° °Maternal Medical History:  ° °Past Medical History:  °Diagnosis Date  °• Headache   ° ° °Past Surgical History:  °Procedure Laterality Date  °• INCISION AND DRAINAGE PERIRECTAL ABSCESS N/A 01/21/2016  ° Procedure: IRRIGATION AND DEBRIDEMENT PERIRECTAL ABSCESS;  Surgeon: Diego F Pabon, MD;  Location: [facility] ORS;  Service: General;  Laterality: N/A;  ° ° °No Known Allergies ° °Prior to Admission medications   °Medication Sig Start Date End Date Taking? Authorizing Provider  °acetaminophen (TYLENOL) 500 MG tablet Take 500-1,000 mg by mouth every 6 (six) hours as needed (for pain.).     [provider]  °Prenat w/o A-FeCbGl-DSS-FA-DHA (CITRANATAL ASSURE) 35-1 & 300 MG tablet Take 2 tablets by mouth daily. °Patient taking differently: Take 2 tablets by mouth at bedtime.  07/08/19   Harris, Robert P, MD  ° ° °OB History  °Gravida Para Term Preterm AB Living  °1 0 0 0 0 0  °SAB TAB Ectopic Multiple Live Births  °0 0 0 0    °  °# Outcome Date GA Lbr Len/2nd Weight Sex Delivery Anes PTL Lv  °1 Current           ° ° °Prenatal care site: [facility] ° °Social History: She  reports that she has quit smoking. Her smoking use included cigarettes. She smoked 0.25 packs per day. She has never used smokeless tobacco. She reports current drug use. Frequency: 14.00 times per week. Drug: Marijuana. She reports that she does not drink alcohol. ° °Family History: family history includes Cancer in her maternal aunt; Heart disease in her paternal grandmother; Hypertension in her mother.  ° °Review of Systems:  °Review of Systems  °Constitutional: Negative.   °HENT: Negative.   °Eyes: Negative.   °Respiratory: Negative.   °  Cardiovascular: Negative.   °Gastrointestinal: Negative.   °Genitourinary: Negative.   °Musculoskeletal: Negative.   °Skin: Negative.   °Neurological: Negative.   °Psychiatric/Behavioral: Negative.   °  ° °Physical Exam:  °BP 122/74    Ht 5' 2" (1.575 m)    Wt 178 lb (80.7 kg)    LMP 02/12/2019 (Exact Date)    BMI 32.56 kg/m²   °Physical Exam °Constitutional:   °   General: She is not in acute distress. °   Appearance: Normal appearance. She is well-developed.  °HENT:  °   Head: Normocephalic and atraumatic.  °Eyes:  °   General: No scleral icterus. °   Conjunctiva/sclera: Conjunctivae normal.  °Cardiovascular:  °   Rate and Rhythm: Normal rate and regular rhythm.  °   Heart sounds: No murmur. No friction rub. No gallop.   °Pulmonary:  °   Effort: Pulmonary effort is normal. No respiratory distress.  °   Breath sounds: Normal breath sounds. No wheezing or rales.  °Abdominal:  °   General:  Bowel sounds are normal. There is no distension.  °   Palpations: Abdomen is soft.  °   Tenderness: There is no abdominal tenderness. There is no guarding or rebound.  °   Comments: Gravid, NT  °Musculoskeletal:     °   General: Normal range of motion.  °   Cervical back: Normal range of motion and neck supple.  °Neurological:  °   General: No focal deficit present.  °   Mental Status: She is alert and oriented to person, place, and time.  °   Cranial Nerves: No cranial nerve deficit.  °Skin: °   General: Skin is warm and dry.  °   Findings: No erythema.  °Psychiatric:     °   Mood and Affect: Mood normal.     °   Behavior: Behavior normal.     °   Judgment: Judgment normal.  ° °FHR: 145 bpm ° °Assessment:  °Carrie [name] is a 23 y.o. G1P0000 female at [redacted]w[redacted]d with a fetus in breech presentation. She is here for her pre-operative appointment for her surgery scheduled for 11/12/2019.  ° °Plan:  °1. Admit to Labor & Delivery  °2. CBC, T&S, NPO, IVF °3. Consents reviewed and signed.  All questions answered.   ° °Babygirl Trager, MD °11/05/2019 9:13 AM  °  °

## 2019-11-06 ENCOUNTER — Encounter
Admission: RE | Admit: 2019-11-06 | Discharge: 2019-11-06 | Disposition: A | Discharge status: Home / Self Care | Payer: Medicaid Other | Source: Ambulatory Visit | Attending: Obstetrics and Gynecology | Admitting: Obstetrics and Gynecology

## 2019-11-06 HISTORY — DX: Cardiac murmur, unspecified: R01.1

## 2019-11-06 HISTORY — DX: Pneumonia, unspecified organism: J18.9

## 2019-11-06 NOTE — Patient Instructions (Addendum)
COVID TESTING Date: Monday, June 14 Testing site:  [facility] [address] Thru Hours:  1:61 am - 1:00 pm Once you are tested, you are asked to stay quarantined (avoiding public places) until after your surgery.   Arrival Time: Please call Labor and Delivery the day before your scheduled C-Section to find out your arrival time. [phone].  Arrival: Tuesday, June 15:   Arrive to the [facility]. If your arrival time is prior to 6:00am, please call Labor and Delivery [phone] from your cell phone upon arrival and someone from L&D or security will come down to the [place] entrance and escort you to Labor and Delivery. If your arrival time is 6:00am or later, please enter the [facility] and follow the greeter's instructions.  REMEMBER: Instructions that are not followed completely may result in serious medical risk, up to and including death; or upon the discretion of your surgeon and anesthesiologist your surgery may need to be rescheduled.  Do not eat food after midnight the night before surgery.  No gum chewing, lozengers or hard candies.  You may however, drink CLEAR liquids up to 2 hours before you are scheduled to arrive for your surgery. Do not drink anything within 2 hours of your scheduled arrival time.  Clear liquids include: - water  - apple juice without pulp - gatorade (not RED) - black coffee or tea (Do NOT add milk or creamers to the coffee or tea) Do NOT drink anything that is not on this list.  DO NOT TAKE ANY MEDICATIONS THE MORNING OF SURGERY  Stop Anti-inflammatories (NSAIDS) such as Advil, Aleve, Ibuprofen, Motrin, Naproxen, Naprosyn and Aspirin based products such as Excedrin, Goodys Powder, BC Powder. (May take Tylenol or Acetaminophen if needed.)  Stop ANY OVER THE COUNTER supplements until after surgery. (May continue multivitamin.)  No Alcohol for 24 hours before or after surgery.  No Smoking including  e-cigarettes for 24 hours prior to surgery.  No chewable tobacco products for at least 6 hours prior to surgery.  No nicotine patches on the day of surgery.  Do not use any "recreational" drugs for at least a week prior to your surgery.  Please be advised that the combination of cocaine and anesthesia may have negative outcomes, up to and including death. If you test positive for cocaine, your surgery will be cancelled.  On the morning of surgery brush your teeth with toothpaste and water, you may rinse your mouth with mouthwash if you wish. Do not swallow any toothpaste or mouthwash.  Do not wear jewelry, make-up, hairpins, clips or nail polish.  Do not wear lotions, powders, or perfumes.   Do not shave 48 hours prior to surgery.   Do not bring valuables to the hospital. [facility] is not responsible for any missing/lost belongings or valuables.   Use CHG wipes as directed on instruction sheet.  Notify your doctor if there is any change in your medical condition (cold, fever, infection).  Plan for stool softeners for home use; pain medications have a tendency to cause constipation. You can also help prevent constipation by eating foods high in fiber such as fruits and vegetables and drinking plenty of fluids as your diet allows.  After surgery, you can help prevent lung complications by doing breathing exercises.  Take deep breaths and cough every 1-2 hours. Your doctor may order a device called an Incentive Spirometer to help you take deep breaths. When coughing or sneezing, hold a pillow firmly  against your incision with both hands. This is called "splinting." Doing this helps protect your incision. It also decreases belly discomfort.  Please call the Pre-admissions Testing Dept. at [phone] if you have any questions about these instructions.  Partner: ONE dedicated partner/guest is allowed to accompany you to Labor and Delivery and into the OR. You may have TWO dedicated  visitors during the remainder of your stay on the Mother/Baby Unit. Your guests may leave and return during normal visiting hours, but will have to be re-screened with each entry to the [facility]. These two visitors are not allowed to switch out for a new guest at any point during your hospital stay.  As a reminder, masks are still required for all [facility] team members, patients and visitors in all [facility] facilities.   Systemwide, no visitors 17 or younger.

## 2019-11-11 ENCOUNTER — Other Ambulatory Visit
Admission: RE | Admit: 2019-11-11 | Discharge: 2019-11-11 | Disposition: A | Discharge status: Home / Self Care | Payer: Medicaid Other | Source: Ambulatory Visit | Attending: Obstetrics and Gynecology | Admitting: Obstetrics and Gynecology

## 2019-11-11 ENCOUNTER — Other Ambulatory Visit: Payer: Self-pay

## 2019-11-11 LAB — TYPE AND SCREEN
ABO/RH(D): A POS
Antibody Screen: NEGATIVE
Extend sample reason: UNDETERMINED

## 2019-11-11 LAB — SARS CORONAVIRUS 2 (TAT 6-24 HRS): SARS Coronavirus 2: NEGATIVE

## 2019-11-11 LAB — CBC
HCT: 33.7 % — ABNORMAL LOW (ref 36.0–46.0)
Hemoglobin: 11.9 g/dL — ABNORMAL LOW (ref 12.0–15.0)
MCH: 31.3 pg (ref 26.0–34.0)
MCHC: 35.3 g/dL (ref 30.0–36.0)
MCV: 88.7 fL (ref 80.0–100.0)
Platelets: 302 10*3/uL (ref 150–400)
RBC: 3.8 MIL/uL — ABNORMAL LOW (ref 3.87–5.11)
RDW: 13.9 % (ref 11.5–15.5)
WBC: 11.9 10*3/uL — ABNORMAL HIGH (ref 4.0–10.5)
nRBC: 0 % (ref 0.0–0.2)

## 2019-11-11 LAB — RAPID HIV SCREEN (HIV 1/2 AB+AG)
HIV 1/2 Antibodies: NONREACTIVE
HIV-1 P24 Antigen - HIV24: NONREACTIVE

## 2019-11-12 ENCOUNTER — Other Ambulatory Visit: Payer: Self-pay

## 2019-11-12 ENCOUNTER — Inpatient Hospital Stay: Payer: Medicaid Other | Admitting: Anesthesiology

## 2019-11-12 ENCOUNTER — Encounter: Payer: Self-pay | Admitting: Obstetrics and Gynecology

## 2019-11-12 ENCOUNTER — Inpatient Hospital Stay
Admission: RE | Admit: 2019-11-12 | Discharge: 2019-11-14 | DRG: 787 | Disposition: A | Discharge status: Home / Self Care | Payer: Medicaid Other | Attending: Obstetrics and Gynecology | Admitting: Obstetrics and Gynecology

## 2019-11-12 ENCOUNTER — Encounter
Admission: RE | Disposition: A | Discharge status: Home / Self Care | Payer: Self-pay | Source: Home / Self Care | Attending: Obstetrics and Gynecology

## 2019-11-12 DIAGNOSIS — Z87891 Personal history of nicotine dependence: Secondary | ICD-10-CM | POA: Diagnosis not present

## 2019-11-12 DIAGNOSIS — D62 Acute posthemorrhagic anemia: Secondary | ICD-10-CM | POA: Diagnosis not present

## 2019-11-12 DIAGNOSIS — O9081 Anemia of the puerperium: Secondary | ICD-10-CM | POA: Diagnosis not present

## 2019-11-12 DIAGNOSIS — Z3400 Encounter for supervision of normal first pregnancy, unspecified trimester: Secondary | ICD-10-CM

## 2019-11-12 DIAGNOSIS — Z20822 Contact with and (suspected) exposure to covid-19: Secondary | ICD-10-CM | POA: Diagnosis present

## 2019-11-12 DIAGNOSIS — O321XX0 Maternal care for breech presentation, not applicable or unspecified: Secondary | ICD-10-CM | POA: Diagnosis present

## 2019-11-12 DIAGNOSIS — Z3A39 39 weeks gestation of pregnancy: Secondary | ICD-10-CM | POA: Diagnosis not present

## 2019-11-12 LAB — URINE DRUG SCREEN, QUALITATIVE (ARMC ONLY)
Amphetamines, Ur Screen: NOT DETECTED
Barbiturates, Ur Screen: NOT DETECTED
Benzodiazepine, Ur Scrn: NOT DETECTED
Cannabinoid 50 Ng, Ur ~~LOC~~: POSITIVE — AB
Cocaine Metabolite,Ur ~~LOC~~: NOT DETECTED
MDMA (Ecstasy)Ur Screen: NOT DETECTED
Methadone Scn, Ur: NOT DETECTED
Opiate, Ur Screen: NOT DETECTED
Phencyclidine (PCP) Ur S: NOT DETECTED
Tricyclic, Ur Screen: NOT DETECTED

## 2019-11-12 LAB — RPR: RPR Ser Ql: NONREACTIVE

## 2019-11-12 LAB — ABO/RH: ABO/RH(D): A POS

## 2019-11-12 SURGERY — Surgical Case
Anesthesia: Spinal

## 2019-11-12 MED ORDER — PROPOFOL 10 MG/ML IV BOLUS
INTRAVENOUS | Status: AC
  Filled 2019-11-12: qty 20

## 2019-11-12 MED ORDER — ONDANSETRON HCL 4 MG/2ML IJ SOLN: 4.0000 mg | Freq: Three times a day (TID) | INTRAMUSCULAR | Status: DC | PRN

## 2019-11-12 MED ORDER — CHLORHEXIDINE GLUCONATE 0.12 % MT SOLN
15.0000 mL | Freq: Once | OROMUCOSAL | Status: AC
  Administered 2019-11-12: 15 mL via OROMUCOSAL
  Filled 2019-11-12: qty 15

## 2019-11-12 MED ORDER — BUPIVACAINE HCL (PF) 0.5 % IJ SOLN
5.0000 mL | Freq: Once | INTRAMUSCULAR | Status: DC
  Filled 2019-11-12: qty 30

## 2019-11-12 MED ORDER — BUPIVACAINE IN DEXTROSE 0.75-8.25 % IT SOLN
INTRATHECAL | Status: DC | PRN
  Administered 2019-11-12: 1.6 mL via INTRATHECAL

## 2019-11-12 MED ORDER — ONDANSETRON HCL 4 MG/2ML IJ SOLN
INTRAMUSCULAR | Status: DC | PRN
  Administered 2019-11-12 (×2): 4 mg via INTRAVENOUS

## 2019-11-12 MED ORDER — NALBUPHINE HCL 10 MG/ML IJ SOLN: 5.0000 mg | Freq: Once | INTRAMUSCULAR | Status: DC | PRN

## 2019-11-12 MED ORDER — OXYCODONE-ACETAMINOPHEN 5-325 MG PO TABS
2.0000 | ORAL_TABLET | ORAL | Status: DC | PRN
  Filled 2019-11-12: qty 2

## 2019-11-12 MED ORDER — KETOROLAC TROMETHAMINE 30 MG/ML IJ SOLN
30.0000 mg | Freq: Four times a day (QID) | INTRAMUSCULAR | Status: AC | PRN
  Administered 2019-11-12: 30 mg via INTRAVENOUS
  Filled 2019-11-12: qty 1

## 2019-11-12 MED ORDER — ACETAMINOPHEN 500 MG PO TABS
1000.0000 mg | ORAL_TABLET | Freq: Four times a day (QID) | ORAL | Status: AC
  Administered 2019-11-12 – 2019-11-13 (×3): 1000 mg via ORAL
  Filled 2019-11-12 (×4): qty 2

## 2019-11-12 MED ORDER — NALBUPHINE HCL 10 MG/ML IJ SOLN
5.0000 mg | INTRAMUSCULAR | Status: DC | PRN
  Administered 2019-11-12 – 2019-11-13 (×2): 5 mg via INTRAVENOUS
  Filled 2019-11-12 (×2): qty 1

## 2019-11-12 MED ORDER — MORPHINE SULFATE (PF) 0.5 MG/ML IJ SOLN
INTRAMUSCULAR | Status: DC | PRN
  Administered 2019-11-12: .1 mg via INTRATHECAL

## 2019-11-12 MED ORDER — DEXAMETHASONE SODIUM PHOSPHATE 10 MG/ML IJ SOLN
INTRAMUSCULAR | Status: DC | PRN
  Administered 2019-11-12: 10 mg via INTRAVENOUS

## 2019-11-12 MED ORDER — NALOXONE HCL 0.4 MG/ML IJ SOLN: 0.4000 mg | INTRAMUSCULAR | Status: DC | PRN

## 2019-11-12 MED ORDER — FENTANYL CITRATE (PF) 100 MCG/2ML IJ SOLN
INTRAMUSCULAR | Status: AC
  Filled 2019-11-12: qty 2

## 2019-11-12 MED ORDER — NALOXONE HCL 4 MG/10ML IJ SOLN
1.0000 ug/kg/h | INTRAVENOUS | Status: DC | PRN
  Filled 2019-11-12: qty 5

## 2019-11-12 MED ORDER — MENTHOL 3 MG MT LOZG
1.0000 | LOZENGE | OROMUCOSAL | Status: DC | PRN
  Filled 2019-11-12: qty 9

## 2019-11-12 MED ORDER — GLYCOPYRROLATE 0.2 MG/ML IJ SOLN
INTRAMUSCULAR | Status: AC
  Filled 2019-11-12: qty 1

## 2019-11-12 MED ORDER — BUPIVACAINE HCL (PF) 0.5 % IJ SOLN: 5.0000 mL | Freq: Once | INTRAMUSCULAR | Status: DC

## 2019-11-12 MED ORDER — DIPHENHYDRAMINE HCL 50 MG/ML IJ SOLN: 12.5000 mg | INTRAMUSCULAR | Status: DC | PRN

## 2019-11-12 MED ORDER — FERROUS SULFATE 325 (65 FE) MG PO TABS
325.0000 mg | ORAL_TABLET | Freq: Two times a day (BID) | ORAL | Status: DC
  Administered 2019-11-12 – 2019-11-14 (×4): 325 mg via ORAL
  Filled 2019-11-12 (×4): qty 1

## 2019-11-12 MED ORDER — DIPHENHYDRAMINE HCL 25 MG PO CAPS: 25.0000 mg | ORAL_CAPSULE | Freq: Four times a day (QID) | ORAL | Status: DC | PRN

## 2019-11-12 MED ORDER — COCONUT OIL OIL: 1.0000 "application " | TOPICAL_OIL | Status: DC | PRN

## 2019-11-12 MED ORDER — OXYCODONE-ACETAMINOPHEN 5-325 MG PO TABS
1.0000 | ORAL_TABLET | ORAL | Status: DC | PRN
  Administered 2019-11-13 – 2019-11-14 (×3): 1 via ORAL
  Filled 2019-11-12 (×3): qty 1

## 2019-11-12 MED ORDER — BUPIVACAINE HCL (PF) 0.5 % IJ SOLN
INTRAMUSCULAR | Status: DC | PRN
  Administered 2019-11-12: 10 mL

## 2019-11-12 MED ORDER — MORPHINE SULFATE (PF) 0.5 MG/ML IJ SOLN
INTRAMUSCULAR | Status: AC
  Filled 2019-11-12: qty 10

## 2019-11-12 MED ORDER — SODIUM CHLORIDE 0.9 % IV SOLN
INTRAVENOUS | Status: DC | PRN
  Administered 2019-11-12: 50 ug/min via INTRAVENOUS

## 2019-11-12 MED ORDER — KETOROLAC TROMETHAMINE 30 MG/ML IJ SOLN
INTRAMUSCULAR | Status: AC
  Filled 2019-11-12: qty 1

## 2019-11-12 MED ORDER — EPHEDRINE SULFATE 50 MG/ML IJ SOLN
INTRAMUSCULAR | Status: DC | PRN
  Administered 2019-11-12: 5 mg via INTRAVENOUS

## 2019-11-12 MED ORDER — DIBUCAINE (PERIANAL) 1 % EX OINT: 1.0000 "application " | TOPICAL_OINTMENT | CUTANEOUS | Status: DC | PRN

## 2019-11-12 MED ORDER — LACTATED RINGERS IV SOLN: INTRAVENOUS | Status: DC

## 2019-11-12 MED ORDER — ONDANSETRON HCL 4 MG/2ML IJ SOLN
INTRAMUSCULAR | Status: AC
  Filled 2019-11-12: qty 2

## 2019-11-12 MED ORDER — DEXAMETHASONE SODIUM PHOSPHATE 10 MG/ML IJ SOLN
INTRAMUSCULAR | Status: AC
  Filled 2019-11-12: qty 1

## 2019-11-12 MED ORDER — SOD CITRATE-CITRIC ACID 500-334 MG/5ML PO SOLN
30.0000 mL | ORAL | Status: AC
  Administered 2019-11-12: 30 mL via ORAL
  Filled 2019-11-12: qty 30

## 2019-11-12 MED ORDER — OXYTOCIN-SODIUM CHLORIDE 30-0.9 UT/500ML-% IV SOLN
INTRAVENOUS | Status: AC
  Filled 2019-11-12: qty 500

## 2019-11-12 MED ORDER — KETOROLAC TROMETHAMINE 30 MG/ML IJ SOLN
INTRAMUSCULAR | Status: DC | PRN
  Administered 2019-11-12: 30 mg via INTRAVENOUS

## 2019-11-12 MED ORDER — VARICELLA VIRUS VACCINE LIVE 1350 PFU/0.5ML IJ SUSR
0.5000 mL | INTRAMUSCULAR | Status: AC | PRN
  Administered 2019-11-14: 0.5 mL via SUBCUTANEOUS
  Filled 2019-11-12 (×3): qty 0.5

## 2019-11-12 MED ORDER — FENTANYL CITRATE (PF) 100 MCG/2ML IJ SOLN
INTRAMUSCULAR | Status: DC | PRN
  Administered 2019-11-12: 15 ug via INTRATHECAL

## 2019-11-12 MED ORDER — BUPIVACAINE 0.25 % ON-Q PUMP DUAL CATH 400 ML
400.0000 mL | INJECTION | Status: DC
  Filled 2019-11-12: qty 400

## 2019-11-12 MED ORDER — IBUPROFEN 600 MG PO TABS
600.0000 mg | ORAL_TABLET | Freq: Four times a day (QID) | ORAL | Status: DC
  Administered 2019-11-13 – 2019-11-14 (×5): 600 mg via ORAL
  Filled 2019-11-12 (×5): qty 1

## 2019-11-12 MED ORDER — CEFAZOLIN SODIUM-DEXTROSE 2-4 GM/100ML-% IV SOLN
2.0000 g | INTRAVENOUS | Status: AC
  Administered 2019-11-12: 2 g via INTRAVENOUS
  Filled 2019-11-12: qty 100

## 2019-11-12 MED ORDER — SODIUM CHLORIDE 0.9% FLUSH: 3.0000 mL | INTRAVENOUS | Status: DC | PRN

## 2019-11-12 MED ORDER — OXYTOCIN-SODIUM CHLORIDE 30-0.9 UT/500ML-% IV SOLN
INTRAVENOUS | Status: DC | PRN
  Administered 2019-11-12: 100 mL via INTRAVENOUS
  Administered 2019-11-12: 500 mL via INTRAVENOUS

## 2019-11-12 MED ORDER — PHENYLEPHRINE HCL (PRESSORS) 10 MG/ML IV SOLN
INTRAVENOUS | Status: DC | PRN
  Administered 2019-11-12: 100 ug via INTRAVENOUS

## 2019-11-12 MED ORDER — OXYTOCIN-SODIUM CHLORIDE 30-0.9 UT/500ML-% IV SOLN
2.5000 [IU]/h | INTRAVENOUS | Status: AC
  Administered 2019-11-12 (×2): 2.5 [IU]/h via INTRAVENOUS

## 2019-11-12 MED ORDER — WITCH HAZEL-GLYCERIN EX PADS: 1.0000 "application " | MEDICATED_PAD | CUTANEOUS | Status: DC | PRN

## 2019-11-12 MED ORDER — SIMETHICONE 80 MG PO CHEW
80.0000 mg | CHEWABLE_TABLET | Freq: Three times a day (TID) | ORAL | Status: DC
  Administered 2019-11-12 – 2019-11-14 (×6): 80 mg via ORAL
  Filled 2019-11-12 (×6): qty 1

## 2019-11-12 MED ORDER — DIPHENHYDRAMINE HCL 25 MG PO CAPS: 25.0000 mg | ORAL_CAPSULE | ORAL | Status: DC | PRN

## 2019-11-12 MED ORDER — EPHEDRINE 5 MG/ML INJ
INTRAVENOUS | Status: AC
  Filled 2019-11-12: qty 10

## 2019-11-12 MED ORDER — ORAL CARE MOUTH RINSE: 15.0000 mL | Freq: Once | OROMUCOSAL | Status: AC

## 2019-11-12 MED ORDER — PRENATAL MULTIVITAMIN CH
1.0000 | ORAL_TABLET | Freq: Every day | ORAL | Status: DC
  Administered 2019-11-12 – 2019-11-14 (×3): 1 via ORAL
  Filled 2019-11-12 (×3): qty 1

## 2019-11-12 MED ORDER — SENNOSIDES-DOCUSATE SODIUM 8.6-50 MG PO TABS
2.0000 | ORAL_TABLET | ORAL | Status: DC
  Administered 2019-11-13 – 2019-11-14 (×2): 2 via ORAL
  Filled 2019-11-12 (×2): qty 2

## 2019-11-12 MED ORDER — NALBUPHINE HCL 10 MG/ML IJ SOLN: 5.0000 mg | INTRAMUSCULAR | Status: DC | PRN

## 2019-11-12 MED ORDER — PHENYLEPHRINE HCL (PRESSORS) 10 MG/ML IV SOLN
INTRAVENOUS | Status: AC
  Filled 2019-11-12: qty 1

## 2019-11-12 SURGICAL SUPPLY — 35 items
CANISTER SUCT 3000ML PPV (MISCELLANEOUS) ×3 IMPLANT
CATH KIT ON-Q SILVERSOAK 5 (CATHETERS) ×2 IMPLANT
CATH KIT ON-Q SILVERSOAK 5IN (CATHETERS) ×6 IMPLANT
CLOSURE WOUND 1/2 X4 (GAUZE/BANDAGES/DRESSINGS) ×1
COVER WAND RF STERILE (DRAPES) ×3 IMPLANT
DERMABOND ADHESIVE PROPEN (GAUZE/BANDAGES/DRESSINGS) ×2
DERMABOND ADVANCED (GAUZE/BANDAGES/DRESSINGS) ×2
DERMABOND ADVANCED .7 DNX12 (GAUZE/BANDAGES/DRESSINGS) ×1 IMPLANT
DERMABOND ADVANCED .7 DNX6 (GAUZE/BANDAGES/DRESSINGS) IMPLANT
DRSG OPSITE POSTOP 4X10 (GAUZE/BANDAGES/DRESSINGS) ×3 IMPLANT
DRSG TELFA 3X8 NADH (GAUZE/BANDAGES/DRESSINGS) ×3 IMPLANT
ELECT CAUTERY BLADE 6.4 (BLADE) ×3 IMPLANT
ELECT REM PT RETURN 9FT ADLT (ELECTROSURGICAL) ×3
ELECTRODE REM PT RTRN 9FT ADLT (ELECTROSURGICAL) ×1 IMPLANT
GAUZE SPONGE 4X4 12PLY STRL (GAUZE/BANDAGES/DRESSINGS) ×3 IMPLANT
GLOVE BIO SURGEON STRL SZ7 (GLOVE) ×3 IMPLANT
GLOVE INDICATOR 7.5 STRL GRN (GLOVE) ×3 IMPLANT
GOWN STRL REUS W/ TWL LRG LVL3 (GOWN DISPOSABLE) ×3 IMPLANT
GOWN STRL REUS W/TWL LRG LVL3 (GOWN DISPOSABLE) ×6
NS IRRIG 1000ML POUR BTL (IV SOLUTION) ×3 IMPLANT
PACK C SECTION (MISCELLANEOUS) ×3 IMPLANT
PAD DRESSING TELFA 3X8 NADH (GAUZE/BANDAGES/DRESSINGS) ×1 IMPLANT
PAD OB MATERNITY 4.3X12.25 (PERSONAL CARE ITEMS) ×6 IMPLANT
PAD PREP 24X41 OB/GYN DISP (PERSONAL CARE ITEMS) ×3 IMPLANT
PENCIL SMOKE ULTRAEVAC 22 CON (MISCELLANEOUS) ×3 IMPLANT
SPONGE LAP 18X18 RF (DISPOSABLE) ×2 IMPLANT
STRIP CLOSURE SKIN 1/2X4 (GAUZE/BANDAGES/DRESSINGS) ×2 IMPLANT
SUT MNCRL 4-0 (SUTURE) ×2
SUT MNCRL 4-0 27XMFL (SUTURE) ×1
SUT PDS AB 1 TP1 96 (SUTURE) ×3 IMPLANT
SUT PLAIN GUT 0 (SUTURE) IMPLANT
SUT VIC AB 0 CTX 36 (SUTURE) ×8
SUT VIC AB 0 CTX36XBRD ANBCTRL (SUTURE) ×2 IMPLANT
SUTURE MNCRL 4-0 27XMF (SUTURE) ×1 IMPLANT
SWABSTK COMLB BENZOIN TINCTURE (MISCELLANEOUS) ×3 IMPLANT

## 2019-11-12 NOTE — Discharge Summary (Signed)
Postpartum Discharge Summary  Patient Name: Carrie [name] DOB: June 22, 1995 MRN: [id number]  Date of admission: 11/12/2019 Delivery date:11/12/2019  Delivering provider: Prentice Docker D  Date of discharge: 11/14/2019  Admitting diagnosis: Pilar Plate breech presentation, single or unspecified fetus [O32.1XX0] Intrauterine pregnancy: [redacted]w[redacted]d    Secondary diagnosis:  Active Problems:   Supervision of normal first pregnancy, antepartum   Breech presentation   Postpartum care following cesarean delivery  Additional problems: none    Discharge diagnosis: Term Pregnancy Delivered                                              Post partum procedures: none Augmentation: N/A Complications: None  Hospital course: Sceduled C/S   24y.o. yo G1P1001 at 325w0das admitted to the hospital 11/12/2019 for scheduled cesarean section with the following indication:Malpresentation.Delivery details are as follows:  Membrane Rupture Time/Date: 8:20 AM ,11/12/2019   Delivery Method:C-Section, Low Transverse  Details of operation can be found in separate operative note.  Patient had an uncomplicated postpartum course.  She is ambulating, tolerating a regular diet, passing flatus, and urinating well. Patient is discharged home in stable condition on  11/14/19        Newborn Data: Birth date:11/12/2019  Birth time:8:22 AM  Gender:Female  Living status:Living  Apgars:8 ,9  Weight:3100 g     Magnesium Sulfate received: No BMZ received: No Rhophylac:No MMR:No T-DaP:Given prenatally on 09/13/2019 Transfusion: No  Physical exam  Vitals:   11/13/19 0823 11/13/19 1540 11/13/19 2338 11/14/19 0843  BP: 116/70 132/63 (!) 118/56 120/68  Pulse: 73 75 84 86  Resp: _0 Temp: 98.6 F (37 C) 98.5 F (36.9 C) 98.4 F (36.9 C) 98.9 F (37.2 C)  TempSrc: Oral Oral Oral Oral  SpO2: 100% 99% 96% 100%  Weight:      Height:       General: alert, cooperative and no distress Lochia: appropriate Uterine  Fundus: firm Incision: Healing well with no significant drainage, Dressing is clean, dry, and intact DVT Evaluation: No evidence of DVT seen on physical exam. Labs: Lab Results  Component Value Date   WBC 22.3 (H) 11/13/2019   HGB 9.4 (L) 11/13/2019   HCT 26.3 (L) 11/13/2019   MCV 87.1 11/13/2019   PLT 270 11/13/2019   CMP Latest Ref Rng & Units 06/14/2019  Glucose 65 - 99 mg/dL 80  BUN 6 - 20 mg/dL 6  Creatinine 0.57 - 1.00 mg/dL 0.60  Sodium 134 - 144 mmol/L 136  Potassium 3.5 - 5.2 mmol/L 3.7  Chloride 96 - 106 mmol/L 103  CO2 20 - 29 mmol/L 20  Calcium 8.7 - 10.2 mg/dL 9.5  Total Protein 6.0 - 8.5 g/dL 6.4  Total Bilirubin 0.0 - 1.2 mg/dL <0.2  Alkaline Phos 39 - 117 IU/L 69  AST 0 - 40 IU/L 23  ALT 0 - 32 IU/L 19   Edinburgh Score: Edinburgh Postnatal Depression Scale Screening Tool 11/12/2019  I have been able to laugh and see the funny side of things. 1  I have looked forward with enjoyment to things. 1  I have blamed myself unnecessarily when things went wrong. 3  I have been anxious or worried for no good reason. 3  I have felt scared or panicky for no good reason. 3  Things have been getting on top  of me. 2  I have been so unhappy that I have had difficulty sleeping. 2  I have felt sad or miserable. 2  I have been so unhappy that I have been crying. 2  The thought of harming myself has occurred to me. 0  Edinburgh Postnatal Depression Scale Total 19      After visit meds:  Allergies as of 11/14/2019   No Known Allergies     Medication List    STOP taking these medications   acetaminophen 500 MG tablet Commonly known as: TYLENOL     TAKE these medications   CitraNatal Assure 35-1 & 300 MG tablet Take 2 tablets by mouth daily. What changed: when to take this   oxyCODONE 5 MG immediate release tablet Commonly known as: Roxicodone Take 1 tablet (5 mg total) by mouth every 6 (six) hours as needed for up to 5 days for severe pain.              Discharge Care Instructions  (From admission, onward)         Start     Ordered   11/14/19 0000  Discharge wound care:       Comments: Keep incision dry, clean.   11/14/19 0942           Discharge home in stable condition Infant Feeding: Formula Infant Disposition: home with mother Discharge instruction: per After Visit Summary and Postpartum booklet. Activity: Advance as tolerated. Pelvic rest for 6 weeks.  Diet: routine diet Anticipated Birth Control: considering Depo Postpartum Appointment:1 week for incision check Future Appointments: Future Appointments  Date Time Provider [place]  11/19/2019  3:10 PM Will Bonnet, MD [facility] None   Follow up Visit:  Follow-up Information    Will Bonnet, MD. Go in 1 week(s).   Specialty: Obstetrics and Gynecology Why: incision check and birth control consult appointment Contact information: [address] [place] [address] [phone]               SIGNED: Christean Leaf, CNM [facility] 11/14/2019, 9:44 AM

## 2019-11-12 NOTE — Transfer of Care (Signed)
Immediate Anesthesia Transfer of Care Note  Patient: Carrie [name]  Procedure(s) Performed: CESAREAN SECTION (N/A )  Patient Location: PACU  Anesthesia Type:Spinal  Level of Consciousness: awake, alert  and oriented  Airway & Oxygen Therapy: Patient Spontanous Breathing  Post-op Assessment: Report given to RN and Post -op Vital signs reviewed and stable  Post vital signs: Reviewed and stable  Last Vitals:  Vitals Value Taken Time  BP 106/70 11/12/19 0925  Temp 36.6 C 11/12/19 0925  Pulse 107 11/12/19 0925  Resp 24 11/12/19 0925  SpO2      Last Pain:  Vitals:   11/12/19 0925  TempSrc: Oral  PainSc: 0-No pain         Complications: No complications documented.

## 2019-11-12 NOTE — Op Note (Signed)
Cesarean Section Operative Note    Name: Carrie [name]  MRN: [id number]  Date of Service: 11/12/2019   Pre-operative Diagnosis:  1) Breech presentation O32.1XX0.  2) intrauterine pregnancy at [redacted]w[redacted]d   Post-operative Diagnosis:  1) Breech presentation O32.1XX0.  2) intrauterine pregnancy at [redacted]w[redacted]d   Procedure: Primary Low Transverse Cesarean Delivery via Pfannenstiel incision with double-layer uterine closure  Surgeon: Surgeon(s) and Role:    * Will Bonnet, MD - Primary  Assistants: Dr. Malachy Mood; No other capable assistant available, in surgery requiring high level assistant.  Anesthesia: spinal   Findings:  1) normal appearing gravid uterus, fallopian tubes, and ovaries 2) viable female infant in complete breech presentation with APGARs 8 and 9, weight 3,100 grams (6 lb 13 oz)   Quantified Blood Loss: 530 mL  Total IV Fluids: 2,200 ml   Urine Output: 100 mL clear urine at end of procedure  Specimens: none  Complications: no complications  Disposition: PACU - hemodynamically stable.   Maternal Condition: stable   Baby condition / location:  Couplet care / Skin to Skin  Procedure Details:  The patient was seen in the Holding Room. The risks, benefits, complications, treatment options, and expected outcomes were discussed with the patient. The patient concurred with the proposed plan, giving informed consent. identified as [place] and the procedure verified as C-Section Delivery. A Time Out was held and the above information confirmed.   After induction of anesthesia, the patient was draped and prepped in the usual sterile manner. A Pfannenstiel incision was made and carried down through the subcutaneous tissue to the fascia. Fascial incision was made and extended transversely. The fascia was separated from the underlying rectus tissue superiorly and inferiorly. The peritoneum was identified and entered. Peritoneal incision was extended longitudinally.  The bladder flap was not bluntly or sharply freed from the lower uterine segment. A low transverse uterine incision was made and the hysterotomy was extended with cranial-caudal tension. Delivered from breech (complete) presentation was a 3,100 gram Living newborn infant(s) or Female with Apgar scores of 8 at one minute and 9 at five minutes. The fetal breech was delivered followed by the legs.  A wet blue towel was wrapped around the fetal midsection and using a mauriceau smellie veit maneuver the fetal head was delivered without difficulty.  Cord ph was not sent the umbilical cord was clamped and cut cord blood was not obtained for evaluation. The placenta was removed Intact and appeared normal. The uterine outline, tubes and ovaries appeared normal. The uterine incision was closed with running locked sutures of 0 Vicryl.  A second layer of the same suture was thrown in an imbricating fashion.  Hemostasis was assured.  The uterus was returned to the abdomen and the paracolic gutters were cleared of all clots and debris.  The rectus muscles were inspected and found to be hemostatic.  The On-Q catheter pumps were inserted in accordance with the manufacturer's recommendations.  The catheters were inserted approximately 4cm cephelad to the incision line, approximately 1cm apart, straddling the midline.  They were inserted to a depth of the 4th mark. They were positioned superficial to the rectus abdominus muscles and deep to the rectus fascia.    The fascia was then reapproximated with running sutures of 1-0 PDS, looped. Three interrupted 3-0 Vicryl stitches were thrown to reduce tension on the skin closure. The subcuticular closure was performed using 4-0 monocryl. The skin closure was reinforced using surgical skin glue.  The On-Q  catheters were bolused with 5 mL of 0.5% marcaine plain for a total of 10 mL.  The catheters were affixed to the skin with surgical skin glue, steri-strips, and tegaderm.    The  surgical assistant performed tissue retraction, assistance with suturing, and fundal pressure.    Instrument, sponge, and needle counts were correct prior the abdominal closure and were correct at the conclusion of the case.  The patient received Ancef 2 gram IV prior to skin incision (within 30 minutes). For VTE prophylaxis she was wearing SCDs throughout the case.  The assistant surgeon was an MD due to lack of availability of another Sales promotion account executive.    Signed: Conard Novak, MD 11/12/2019 9:28 AM

## 2019-11-12 NOTE — Anesthesia Preprocedure Evaluation (Signed)
Anesthesia Evaluation  Patient identified by MRN, date of birth, ID band Patient awake    Reviewed: Allergy & Precautions, H&P , NPO status , Patient's Chart, lab work & pertinent test results  Airway Mallampati: III  TM Distance: >3 FB     Dental  (+) Chipped   Pulmonary former smoker,    breath sounds clear to auscultation       Cardiovascular Exercise Tolerance: Good (-) hypertensionnegative cardio ROS   Rhythm:regular Rate:Normal     Neuro/Psych  Headaches,    GI/Hepatic negative GI ROS, (+)     substance abuse  marijuana use,   Endo/Other    Renal/GU   negative genitourinary   Musculoskeletal   Abdominal   Peds  Hematology negative hematology ROS (+)   Anesthesia Other Findings Past Medical History: No date: Headache No date: Heart murmur     Comment:  as a baby No date: Pneumonia     Comment:  as a baby  Past Surgical History: 01/21/2016: INCISION AND DRAINAGE PERIRECTAL ABSCESS; N/A     Comment:  Procedure: IRRIGATION AND DEBRIDEMENT PERIRECTAL               ABSCESS;  Surgeon: Leafy Ro, MD;  Location: [facility]               ORS;  Service: General;  Laterality: N/A;     Reproductive/Obstetrics (+) Pregnancy                             Anesthesia Physical Anesthesia Plan  ASA: II  Anesthesia Plan: Spinal   Post-op Pain Management:    Induction:   PONV Risk Score and Plan:   Airway Management Planned:   Additional Equipment:   Intra-op Plan:   Post-operative Plan:   Informed Consent: I have reviewed the patients History and Physical, chart, labs and discussed the procedure including the risks, benefits and alternatives for the proposed anesthesia with the patient or authorized representative who has indicated his/her understanding and acceptance.     Dental Advisory Given  Plan Discussed with: Anesthesiologist  Anesthesia Plan Comments:          Anesthesia Quick Evaluation

## 2019-11-12 NOTE — Anesthesia Procedure Notes (Signed)
Performed by: Lyndsy Gilberto, CRNA Pre-anesthesia Checklist: Patient identified, Emergency Drugs available, Suction available, Patient being monitored and Timeout performed Oxygen Delivery Method: Nasal cannula       

## 2019-11-12 NOTE — Interval H&P Note (Signed)
History and Physical Interval Note:  11/12/2019 7:39 AM  Carrie [name]  has presented today for surgery, with the diagnosis of Breech presentation O32.1XX0.  The various methods of treatment have been discussed with the patient and family. After consideration of risks, benefits and other options for treatment, the patient has consented to  Procedure(s): CESAREAN SECTION (N/A) as a surgical intervention.  The patient's history has been reviewed, patient examined, no change in status, stable for surgery.  I have reviewed the patient's chart and labs.  Questions were answered to the patient's satisfaction.  An ultrasound was performed immediately pre-operatively and the breech presentation was confirmed.  The surgery was discussed and she wishes to proceed.   Thomasene Mohair, MD, [facility], [facility] 11/12/2019 7:40 AM

## 2019-11-13 ENCOUNTER — Encounter: Payer: Self-pay | Admitting: Obstetrics and Gynecology

## 2019-11-13 LAB — CBC
HCT: 26.3 % — ABNORMAL LOW (ref 36.0–46.0)
Hemoglobin: 9.4 g/dL — ABNORMAL LOW (ref 12.0–15.0)
MCH: 31.1 pg (ref 26.0–34.0)
MCHC: 35.7 g/dL (ref 30.0–36.0)
MCV: 87.1 fL (ref 80.0–100.0)
Platelets: 270 10*3/uL (ref 150–400)
RBC: 3.02 MIL/uL — ABNORMAL LOW (ref 3.87–5.11)
RDW: 13.5 % (ref 11.5–15.5)
WBC: 22.3 10*3/uL — ABNORMAL HIGH (ref 4.0–10.5)
nRBC: 0 % (ref 0.0–0.2)

## 2019-11-13 NOTE — Clinical Social Work Maternal (Signed)
  CLINICAL SOCIAL WORK MATERNAL/CHILD NOTE  Patient Details  Name: Carrie [name] MRN: [id number] Date of Birth: 1995/11/29  Date:  11/13/2019  Clinical Social Worker Initiating Note:  Thea Gist RNCM Date/Time: Initiated:  11/13/19/0930     Child's Name:  Carrie [name]   Biological Parents:  Father, Mother   Need for Interpreter:  None   Reason for Referral:  Current Substance Use/Substance Use During Pregnancy    Address:  [address] [place] [address]    Phone number:  [phone] (home)     Additional phone number:   Household Members/Support Persons (HM/SP):   Household Member/Support Person 1   HM/SP Name Relationship DOB or Age  HM/SP -1 Derek Training and development officer FOB unknown  HM/SP -2        HM/SP -3        HM/SP -4        HM/SP -5        HM/SP -6        HM/SP -7        HM/SP -8          Natural Supports (not living in the home):  Friends   Professional Supports:     Employment: Unemployed   Type of Work:     Education:  9 to 11 years   Homebound arranged: No  Financial Resources:  [employer]   Other Resources:  Sales executive , [employer]   Cultural/Religious Considerations Which May Impact Care:    Strengths:  Ability to meet basic needs , Optometrist chosen, Home prepared for child    Psychotropic Medications:         Pediatrician:    [employer]  Pediatrician List:   [facility] [place]    [facility]  [facility]      Pediatrician Fax Number:    Risk Factors/Current Problems:  Substance Use    Cognitive State:  Alert , Goal Oriented    Mood/Affect:  Calm    CSW Assessment:  RNCM completed assessment with MOB by phone. MOB alert and verbally responsive and verbalizes feeling some better today. RNCM completed assessment and MOB answering all questions appropriately. MOB does admit to substance abuse and understands CPS report will be made, she denies any previous history  with CPS. MOB has already got WIC/FS in place and understands she needs to notify [facility] that baby has been born. MOB verbalizes that they have all necessary items for baby at home and FOB lives in home as well as will be able to provide assistance. Discussed and provided education on SIDS/PPD and MOB was receptive and appreciative of information. RNCM placed call and spoke with Iantha Fallen with DSS to provide CPS report. RNCM will remain available and follow up for any needs.   CSW Plan/Description:  CSW Awaiting CPS Disposition Plan    Trenton Founds, RN 11/13/2019, 10:00 AM

## 2019-11-13 NOTE — Anesthesia Postprocedure Evaluation (Signed)
Anesthesia Post Note  Patient: Carrie [name]  Procedure(s) Performed: CESAREAN SECTION (N/A )  Patient location during evaluation: Mother Baby Anesthesia Type: Spinal Level of consciousness: oriented and awake and alert Pain management: pain level controlled Vital Signs Assessment: post-procedure vital signs reviewed and stable Respiratory status: spontaneous breathing and respiratory function stable Cardiovascular status: blood pressure returned to baseline and stable Postop Assessment: no headache, no backache, no apparent nausea or vomiting and able to ambulate Anesthetic complications: no   No complications documented.   Last Vitals:  Vitals:   11/13/19 0823 11/13/19 1540  BP: 116/70 132/63  Pulse: 73 75  Resp: 20 18  Temp: 37 C 36.9 C  SpO2: 100% 99%    Last Pain:  Vitals:   11/13/19 1540  TempSrc: Oral  PainSc:                  [name]

## 2019-11-13 NOTE — Progress Notes (Signed)
POD #1 Primary Cesarean section for Breech presentation Subjective:  Doing well. Denies lightheadedness when OOB ambulating. Rating pain at 5/10. Tolerating regular diet. Passing flatus. Voiding without difficulty. Bleeding slowing and is like a period.  Baby was circumcised today. Bottle feeding.   Objective:  Blood pressure 116/70, pulse 73, temperature 98.6 F (37 C), temperature source Oral, resp. rate 20, height 5\' 2"  (1.575 m), weight 81 kg, last menstrual period 02/12/2019, SpO2 100 %, unknown if currently breastfeeding.  General: BF in NAD Pulmonary: no increased work of breathing/ CTAB Heart: RRR without murmur Abdomen: non-distended, non-tender, fundus firm at level of umbilicus-1-2 FB, NABS Incision: C&D&I Honeycomb dressing/ ON Q with some dried blood, but intact Extremities: no edema, no erythema, no tenderness  Results for orders placed or performed during the hospital encounter of 11/12/19 (from the past 72 hour(s))  ABO/Rh     Status: None   Collection Time: 11/12/19  6:11 AM  Result Value Ref Range   ABO/RH(D)      A POS Performed at [facility], [address] [place], [place] [address]   Urine Drug Screen, Qualitative ([place] only)     Status: Abnormal   Collection Time: 11/12/19  7:10 AM  Result Value Ref Range   Tricyclic, Ur Screen NONE DETECTED NONE DETECTED   Amphetamines, Ur Screen NONE DETECTED NONE DETECTED   MDMA (Ecstasy)Ur Screen NONE DETECTED NONE DETECTED   Cocaine Metabolite,Ur [place] NONE DETECTED NONE DETECTED   Opiate, Ur Screen NONE DETECTED NONE DETECTED   Phencyclidine (PCP) Ur S NONE DETECTED NONE DETECTED   Cannabinoid 50 Ng, Ur [place] POSITIVE (A) NONE DETECTED   Barbiturates, Ur Screen NONE DETECTED NONE DETECTED   Benzodiazepine, Ur Scrn NONE DETECTED NONE DETECTED   Methadone Scn, Ur NONE DETECTED NONE DETECTED    Comment: (NOTE) Tricyclics + metabolites, urine    Cutoff 1000 ng/mL Amphetamines + metabolites, urine  Cutoff 1000  ng/mL MDMA (Ecstasy), urine              Cutoff 500 ng/mL Cocaine Metabolite, urine          Cutoff 300 ng/mL Opiate + metabolites, urine        Cutoff 300 ng/mL Phencyclidine (PCP), urine         Cutoff 25 ng/mL Cannabinoid, urine                 Cutoff 50 ng/mL Barbiturates + metabolites, urine  Cutoff 200 ng/mL Benzodiazepine, urine              Cutoff 200 ng/mL Methadone, urine                   Cutoff 300 ng/mL  The urine drug screen provides only a preliminary, unconfirmed analytical test result and should not be used for non-medical purposes. Clinical consideration and professional judgment should be applied to any positive drug screen result due to possible interfering substances. A more specific alternate chemical method must be used in order to obtain a confirmed analytical result. Gas chromatography / mass spectrometry (GC/MS) is the preferred confirm atory method. Performed at [facility], [address] [place], [place] [address]   CBC     Status: Abnormal   Collection Time: 11/13/19  5:27 AM  Result Value Ref Range   WBC 22.3 (H) 4.0 - 10.5 K/uL   RBC 3.02 (L) 3.87 - 5.11 MIL/uL   Hemoglobin 9.4 (L) 12.0 - 15.0 g/dL   HCT 26.3 (L)  36 - 46 %   MCV 87.1 80.0 - 100.0 fL   MCH 31.1 26.0 - 34.0 pg   MCHC 35.7 30.0 - 36.0 g/dL   RDW 22.0 25.4 - 27.0 %   Platelets 270 150 - 400 K/uL   nRBC 0.0 0.0 - 0.2 %    Comment: Performed at [facility], [address], [place], [place] [address]   Inocente Salles Postnatal Depression Scale Screening Tool 11/12/2019  I have been able to laugh and see the funny side of things. 1  I have looked forward with enjoyment to things. 1  I have blamed myself unnecessarily when things went wrong. 3  I have been anxious or worried for no good reason. 3  I have felt scared or panicky for no good reason. 3  Things have been getting on top of me. 2  I have been so unhappy that I have had difficulty sleeping. 2  I have felt  sad or miserable. 2  I have been so unhappy that I have been crying. 2  The thought of harming myself has occurred to me. 0  Edinburgh Postnatal Depression Scale Total 19     Assessment:   24 y.o. G1P1001 postoperativeday # 1-stable  Continue postpartum care Elevated EPDS  Plan:  1) Acute blood loss anemia - hemodynamically stable and asymptomatic - po ferrous sulfate/ prenatal vitamins  2)A POS/ RI/ VNI-offer Varivax prior to discharge  3) TDAP-given AP   4) Bottle/Contraception-undecided  5) Elevated EPDS-discuss with patient prior to discharge and assess for need for medication and or counseling.  6) Disposition-probable discharge tomorrow. Will need early follow up due to #5  Farrel Conners, CNM

## 2019-11-14 ENCOUNTER — Encounter: Payer: Self-pay | Admitting: Obstetrics and Gynecology

## 2019-11-14 MED ORDER — OXYCODONE HCL 5 MG PO TABS: 5.0000 mg | ORAL_TABLET | Freq: Four times a day (QID) | ORAL | 0 refills | Status: AC | PRN

## 2019-11-14 NOTE — Progress Notes (Signed)
Pt discharged with infant.  Discharge instructions, prescriptions and follow up appointment given to and reviewed with pt. Pt verbalized understanding. Escorted out by auxillary. 

## 2019-11-18 NOTE — Anesthesia Procedure Notes (Signed)
Spinal  Patient location during procedure: OR Staffing Performed: resident/CRNA  Anesthesiologist: Tera Mater, MD Resident/CRNA: Demetrius Charity, CRNA Preanesthetic Checklist Completed: patient identified, IV checked, site marked, risks and benefits discussed, surgical consent, monitors and equipment checked, pre-op evaluation and timeout performed Spinal Block Patient position: sitting Prep: ChloraPrep Patient monitoring: heart rate, continuous pulse ox, blood pressure and cardiac monitor Approach: midline Location: L3-4 Injection technique: single-shot Needle Needle type: Whitacre and Introducer  Needle gauge: 25 G Needle length: 9 cm Assessment Sensory level: T4 Additional Notes Negative paresthesia. Negative blood return. Positive free-flowing CSF. Expiration date of kit checked and confirmed. Patient tolerated procedure well, without complications.

## 2019-11-18 NOTE — Addendum Note (Signed)
Addendum  created 11/18/19 0654 by Malva Cogan, CRNA   Child order released for a procedure order, Clinical Note Signed, Intraprocedure Blocks edited

## 2019-11-19 ENCOUNTER — Encounter: Payer: Self-pay | Admitting: Obstetrics and Gynecology

## 2019-11-19 ENCOUNTER — Ambulatory Visit (INDEPENDENT_AMBULATORY_CARE_PROVIDER_SITE_OTHER): Payer: Medicaid Other | Admitting: Obstetrics and Gynecology

## 2019-11-19 ENCOUNTER — Other Ambulatory Visit: Payer: Self-pay

## 2019-11-19 VITALS — BP 122/74 | Ht 62.0 in | Wt 159.0 lb

## 2019-11-19 DIAGNOSIS — Z09 Encounter for follow-up examination after completed treatment for conditions other than malignant neoplasm: Secondary | ICD-10-CM

## 2019-11-19 NOTE — Progress Notes (Signed)
   Postoperative Follow-up Patient presents post op from cesarean section  1 week ago.  Subjective: She denies fever, chills, nausea and vomiting. Eating a regular diet without difficulty. The patient is not having any pain.  Activity: normal activities of daily living. She denies issues with her incision.   From a depression standpoint, she states that she is coping well and that she has some good times and bad. Mostly, she states that she has anxiety about taking care of a child. But, she states she is well supported and declines treatment at this time.    Objective: BP 122/74   Ht 5\' 2"  (1.575 m)   Wt 159 lb (72.1 kg)   BMI 29.08 kg/m   Constitutional: Well nourished, well developed female in no acute distress.  HEENT: normal Skin: Warm and dry.  Abdomen: s/nt/nd, uterus at U-3 clean, dry, intact and without erythema, induration, warmth, and tenderness Extremity: no edema   Assessment: 24 y.o. s/p cesarean section progressing well  Plan: Patient has done well after surgery with no apparent complications.  I have discussed the post-operative course to date, and the expected progress moving forward.  The patient understands what complications to be concerned about.    Activity plan: increase slowly. No lifting > 25 pounds.   Monitor for signs of depression. She will call if she has concerns.   Return in about 5 weeks (around 12/24/2019) for Six Week Postpartum.  12/26/2019, MD 11/19/2019 3:29 PM

## 2019-11-20 ENCOUNTER — Telehealth: Payer: Self-pay

## 2019-11-20 NOTE — Telephone Encounter (Signed)
Pt called after hour nurse 11/19/19 5:42pm c/o delivered a week ago; is experiencing bright red bleeding, stringy, and spotty.  [phone]  Pt states she is not saturating a pad q32min-1hr.  Pt states she stopped bleeding for 2 d and started again.  Adv this is probably her period; not to be surprised if it's heavier than normal but we still don't want her to saturate a pad q87min-1hr.  Pt states it's a period flow. Pt reassured.

## 2019-12-20 ENCOUNTER — Ambulatory Visit: Payer: Medicaid Other | Admitting: Obstetrics and Gynecology

## 2019-12-21 ENCOUNTER — Other Ambulatory Visit: Payer: Self-pay

## 2019-12-21 ENCOUNTER — Emergency Department: Payer: Medicaid Other

## 2019-12-21 ENCOUNTER — Emergency Department
Admission: EM | Admit: 2019-12-21 | Discharge: 2019-12-22 | Disposition: A | Discharge status: Home / Self Care | Payer: Medicaid Other | Attending: Emergency Medicine | Admitting: Emergency Medicine

## 2019-12-21 DIAGNOSIS — N939 Abnormal uterine and vaginal bleeding, unspecified: Secondary | ICD-10-CM | POA: Diagnosis not present

## 2019-12-21 DIAGNOSIS — Z87891 Personal history of nicotine dependence: Secondary | ICD-10-CM | POA: Insufficient documentation

## 2019-12-21 LAB — CBC
HCT: 38.8 % (ref 36.0–46.0)
Hemoglobin: 12.9 g/dL (ref 12.0–15.0)
MCH: 30.5 pg (ref 26.0–34.0)
MCHC: 33.2 g/dL (ref 30.0–36.0)
MCV: 91.7 fL (ref 80.0–100.0)
Platelets: 283 10*3/uL (ref 150–400)
RBC: 4.23 MIL/uL (ref 3.87–5.11)
RDW: 13 % (ref 11.5–15.5)
WBC: 5.1 10*3/uL (ref 4.0–10.5)
nRBC: 0 % (ref 0.0–0.2)

## 2019-12-21 LAB — BASIC METABOLIC PANEL
Anion gap: 9 (ref 5–15)
BUN: 8 mg/dL (ref 6–20)
CO2: 23 mmol/L (ref 22–32)
Calcium: 8.9 mg/dL (ref 8.9–10.3)
Chloride: 108 mmol/L (ref 98–111)
Creatinine, Ser: 0.82 mg/dL (ref 0.44–1.00)
GFR calc Af Amer: >60 mL/min (ref >60)
GFR calc non Af Amer: >60 mL/min (ref >60)
Glucose, Bld: 100 mg/dL — ABNORMAL HIGH (ref 70–99)
Potassium: 3.5 mmol/L (ref 3.5–5.1)
Sodium: 140 mmol/L (ref 135–145)

## 2019-12-21 LAB — POCT PREGNANCY, URINE: Preg Test, Ur: NEGATIVE

## 2019-12-21 NOTE — ED Provider Notes (Deleted)
-----------------------------------------   11:12 PM on 12/21/2019 -----------------------------------------  Assuming care from Dr. Marisa Severin.  In short, Carrie [name] is a 24 y.o. female with a chief complaint of vaginal bleeding about 5 weeks post-partum.  Refer to the original H&P for additional details.  The current plan of care is to follow up on U/S and reassess.   ----------------------------------------- 11:38 PM on 12/21/2019 -----------------------------------------  Ultrasound report indicates that there is some remaining hemorrhagic products but no convincing evidence of retained products of conception or other concerning or emergent abnormalities on the ultrasound.  The patient has been resting quietly and in no distress after almost 12 hours in the emergency department.  I updated her with the results and encouraged her to call [facility] in the morning to schedule a follow-up appointment.  She understands and agrees with the plan and I gave my usual and customary return precautions.   Carrie Rose, MD 12/21/19 [phone]

## 2019-12-21 NOTE — Discharge Instructions (Addendum)
As we discussed, your ultrasound and lab work was generally reassuring.  The ultrasound demonstrates some residual "hemorrhagic products" (blood) but no evidence of any other emerging or concerning finding.  Please follow-up with a phone call to [facility] to schedule a follow-up appointment.  Return to the emergency department if you develop new or worsening symptoms that concern you.

## 2019-12-21 NOTE — ED Triage Notes (Signed)
Pt presents to ED via POV, states had baby on 11/12/2019, pt states G1, pt states is 6 weeks post partum, is not currently breast feeding. Pt denies blood clots, pt states bleeding through 2 pads/hr. Pt states has been going on x 2 days. Pt presents alert and oriented, VSS, ambulatory without difficulty.

## 2019-12-21 NOTE — ED Provider Notes (Signed)
-----------------------------------------   11:12 PM on 12/21/2019 -----------------------------------------  Assuming care from Dr. Marisa Severin.  In short, Carrie [name] is a 24 y.o. female with a chief complaint of vaginal bleeding about 5 weeks post-partum.  Refer to the original H&P for additional details.  The current plan of care is to follow up on U/S and reassess.   ----------------------------------------- 11:38 PM on 12/21/2019 -----------------------------------------  Ultrasound report indicates that there is some remaining hemorrhagic products but no convincing evidence of retained products of conception or other concerning or emergent abnormalities on the ultrasound.  The patient has been resting quietly and in no distress after almost 12 hours in the emergency department.  I updated her with the results and encouraged her to call [facility] in the morning to schedule a follow-up appointment.  She understands and agrees with the plan and I gave my usual and customary return precautions.   Loleta Rose, MD 12/21/19 2340

## 2019-12-21 NOTE — ED Provider Notes (Addendum)
[facility] Emergency Department Provider Note ____________________________________________   First MD Initiated Contact with Patient 12/21/19 2303     (approximate)  I have reviewed the triage vital signs and the nursing notes.   HISTORY  Chief Complaint Vaginal Bleeding    HPI Carrie [name] is a 24 y.o. female with PMH as noted below and status post cesarean section 5 weeks ago who presents with vaginal bleeding, acute onset yesterday, now somewhat subsided while she was waiting in the ER, and not associated with weakness or lightheadedness.  She has had some mild right suprapubic pain.  She denies any other abnormal bleeding or bruising.  Past Medical History:  Diagnosis Date  . Headache   . Heart murmur    as a baby  . Pneumonia    as a baby    Patient Active Problem List   Diagnosis Date Noted  . Postpartum care following cesarean delivery 11/14/2019  . Buttocks presentation 11/12/2019  . Breech presentation 10/16/2019  . Abdominal pain affecting pregnancy, antepartum 10/08/2019  . Decreased fetal movement affecting management of pregnancy in second trimester 08/21/2019  . Round ligament pain 08/21/2019  . Supervision of normal first pregnancy, antepartum 03/28/2019  . Perirectal abscess     Past Surgical History:  Procedure Laterality Date  . CESAREAN SECTION N/A 11/12/2019   Procedure: CESAREAN SECTION;  Surgeon: Conard Novak, MD;  Location: [facility] ORS;  Service: Obstetrics;  Laterality: N/A;  . INCISION AND DRAINAGE PERIRECTAL ABSCESS N/A 01/21/2016   Procedure: IRRIGATION AND DEBRIDEMENT PERIRECTAL ABSCESS;  Surgeon: Leafy Ro, MD;  Location: [facility] ORS;  Service: General;  Laterality: N/A;    Prior to Admission medications   Medication Sig Start Date End Date Taking? Authorizing Provider  Prenat w/o A-FeCbGl-DSS-FA-DHA (CITRANATAL ASSURE) 35-1 & 300 MG tablet Take 2 tablets by mouth daily. Patient taking differently: Take 2  tablets by mouth at bedtime.  07/08/19   Nadara Mustard, MD    Allergies Patient has no known allergies.  Family History  Problem Relation Age of Onset  . Hypertension Mother   . Cancer Maternal Aunt   . Heart disease Paternal Grandmother     Social History Social History   Tobacco Use  . Smoking status: Former Smoker    Packs/day: 0.25    Types: Cigarettes  . Smokeless tobacco: Never Used  Vaping Use  . Vaping Use: Never used  Substance Use Topics  . Alcohol use: No  . Drug use: Yes    Frequency: 14.0 times per week    Types: Marijuana    Review of Systems  Constitutional: No fever/chills. Eyes: No redness. ENT: No sore throat. Cardiovascular: Denies chest pain. Respiratory: Denies shortness of breath. Gastrointestinal: No vomiting. Genitourinary: Negative for dysuria or frequency.  Positive for vaginal bleeding. Musculoskeletal: Negative for back pain. Skin: Negative for rash. Neurological: Negative for headache.   ____________________________________________   PHYSICAL EXAM:  VITAL SIGNS: ED Triage Vitals [12/21/19 1219]  Enc Vitals Group     BP (!) 143/93     Pulse Rate 79     Resp 18     Temp 98.7 F (37.1 C)     Temp Source Oral     SpO2 100 %     Weight 140 lb (63.5 kg)     Height 5\' 2"  (1.575 m)     Head Circumference      Peak Flow      Pain Score 2  Pain Loc      Pain Edu?      Excl. in GC?     Constitutional: Alert and oriented. Well appearing and in no acute distress. Eyes: Conjunctivae are normal.  Head: Atraumatic. Nose: No congestion/rhinnorhea. Mouth/Throat: Mucous membranes are moist.   Neck: Normal range of motion.  Cardiovascular: Normal rate, regular rhythm.   Good peripheral circulation. Respiratory: Normal respiratory effort.  No retractions. Gastrointestinal: Soft and nontender. No distention.  Genitourinary: Normal external genitalia.  Small amount of blood pooling in the vaginal vault on speculum exam.  No CMT  or adnexal tenderness. Musculoskeletal: No lower extremity edema.  Extremities warm and well perfused.  Neurologic:  Normal speech and language. No gross focal neurologic deficits are appreciated.  Skin:  Skin is warm and dry. No rash noted. Psychiatric: Mood and affect are normal. Speech and behavior are normal.  ____________________________________________   LABS (all labs ordered are listed, but only abnormal results are displayed)  Labs Reviewed  BASIC METABOLIC PANEL - Abnormal; Notable for the following components:      Result Value   Glucose, Bld 100 (*)    All other components within normal limits  CBC  POCT PREGNANCY, URINE   ____________________________________________  EKG   ____________________________________________  RADIOLOGY  US pelvis: Pending  ____________________________________________   PROCEDURES  Procedure(s) performed: No  Procedures  Critical Care performed: No ____________________________________________   INITIAL IMPRESSION / ASSESSMENT AND PLAN / ED COURSE  Pertinent labs & imaging results that were available during my care of the patient were reviewed by me and considered in my medical decision making (see chart for details).  24 year old female with PMH as noted above an approximately 5 weeks status post cesarean section for breech presents with recurrent vaginal bleeding over the last day, although she states it has subsided during the 8 hours that she was waiting in the ED.  She denies other associated symptoms besides mild right-sided suprapubic pain.  On exam she is well-appearing and her vital signs are normal.  The physical exam is unremarkable except for small amount of vaginal bleeding.  She has no CMT or adnexal tenderness.  The cervical os is closed.  Lab work-up is unremarkable.  We will obtain ultrasound for evaluation of possible retained POC's.  ----------------------------------------- 11:50 PM on  12/21/2019 -----------------------------------------  Ultrasound shows some hemorrhagic products in the uterus but no evidence of retained POC's.  I discussed the patient's clinical status and results of the work-up with Dr. Gaynelle Arabian from OB/GYN who agrees with discharge home with follow-up this week.  I counseled the patient on the results of the work-up.  Return precautions given, and she expresses understanding. ____________________________________________   FINAL CLINICAL IMPRESSION(S) / ED DIAGNOSES  Final diagnoses:  Nonmenstrual vaginal bleeding      NEW MEDICATIONS STARTED DURING THIS VISIT:  New Prescriptions   No medications on file     Note:  This document was prepared using Dragon voice recognition software and may include unintentional dictation errors.   Dionne Bucy, MD 12/21/19 2322    Dionne Bucy, MD 12/21/19 2350

## 2019-12-21 NOTE — ED Notes (Signed)
Patient transported to Ultrasound 

## 2019-12-21 NOTE — ED Notes (Signed)
Patient given update on wait time. Patient verbalizes understanding.  

## 2019-12-21 NOTE — ED Notes (Signed)
First Nurse Note: Pt reports heavy vaginal bleeding. Pt states that she is 5 week Post partum. Pt is in NAD.

## 2019-12-22 NOTE — ED Notes (Signed)
Reviewed discharge instructions and follow-up care with patient. Patient verbalized understanding of all information reviewed. Patient stable, with no distress noted at this time.    

## 2020-01-09 ENCOUNTER — Ambulatory Visit (INDEPENDENT_AMBULATORY_CARE_PROVIDER_SITE_OTHER): Payer: Medicaid Other | Admitting: Obstetrics and Gynecology

## 2020-01-09 ENCOUNTER — Encounter: Payer: Self-pay | Admitting: Obstetrics and Gynecology

## 2020-01-09 ENCOUNTER — Other Ambulatory Visit: Payer: Self-pay

## 2020-01-09 DIAGNOSIS — Z30013 Encounter for initial prescription of injectable contraceptive: Secondary | ICD-10-CM

## 2020-01-09 MED ORDER — MEDROXYPROGESTERONE ACETATE 150 MG/ML IM SUSP: 150.0000 mg | INTRAMUSCULAR | 4 refills | Status: DC

## 2020-01-09 NOTE — Progress Notes (Signed)
Postpartum Visit   Chief Complaint  Patient presents with  . Postpartum Care    6/15 C/S delivery   History of Present Illness: Patient is a 24 y.o. G1P1001 presents for postpartum visit.  Date of delivery: 11/12/2019 Type of delivery: Cesarean section Episiotomy No.  Laceration: not applicable  Pregnancy or labor problems:  Breech presentation, history of marijuana use Any problems since the delivery:  Vaginal bleeding.  It eventually stopped and slowed down.    Newborn Details:  SINGLETON :  1. 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 Birth weight: 3,100 grams Maternal Details:  Breast Feeding:  No, formula.   Post partum depression/anxiety noted:  yes Edinburgh Post-Partum Depression Score:  12. She states that she is coping and doesn't want medication at this time. Denies Si/HI.   Date of last PAP: 03/27/2020  normal   Past Medical History:  Diagnosis Date  . Headache   . Heart murmur    as a baby  . Pneumonia    as a baby    Past Surgical History:  Procedure Laterality Date  . CESAREAN SECTION N/A 11/12/2019   Procedure: CESAREAN SECTION;  Surgeon: Conard Novak, MD;  Location: [facility] ORS;  Service: Obstetrics;  Laterality: N/A;  . INCISION AND DRAINAGE PERIRECTAL ABSCESS N/A 01/21/2016   Procedure: IRRIGATION AND DEBRIDEMENT PERIRECTAL ABSCESS;  Surgeon: Leafy Ro, MD;  Location: [facility] ORS;  Service: General;  Laterality: N/A;    Prior to Admission medications   Medication Sig Start Date End Date Taking? Authorizing Provider  Prenat w/o A-FeCbGl-DSS-FA-DHA (CITRANATAL ASSURE) 35-1 & 300 MG tablet Take 2 tablets by mouth daily. Patient taking differently: Take 2 tablets by mouth at bedtime.  07/08/19   Nadara Mustard, MD    No Known Allergies   Social History   Socioeconomic History  . Marital status: Single    Spouse name: Not on file  . Number of children: Not on file  . Years of education: Not on file  . Highest education level: Not on file  Occupational  History  . Occupation: Unemployed  Tobacco Use  . Smoking status: Former Smoker    Packs/day: 0.25    Types: Cigarettes  . Smokeless tobacco: Never Used  Vaping Use  . Vaping Use: Never used  Substance and Sexual Activity  . Alcohol use: No  . Drug use: Yes    Frequency: 14.0 times per week    Types: Marijuana  . Sexual activity: Yes    Birth control/protection: None    Comment: undecided   Other Topics Concern  . Not on file  Social History Narrative  . Not on file   Social Determinants of Health   Financial Resource Strain:   . Difficulty of Paying Living Expenses:   Food Insecurity:   . Worried About Programme researcher, broadcasting/film/video in the Last Year:   . Barista in the Last Year:   Transportation Needs:   . Freight forwarder (Medical):   Marland Kitchen Lack of Transportation (Non-Medical):   Physical Activity:   . Days of Exercise per Week:   . Minutes of Exercise per Session:   Stress:   . Feeling of Stress :   Social Connections:   . Frequency of Communication with Friends and Family:   . Frequency of Social Gatherings with Friends and Family:   . Attends Religious Services:   . Active Member of Clubs or Organizations:   . Attends Banker Meetings:   Marland Kitchen Marital  Status:   Intimate Partner Violence:   . Fear of Current or Ex-Partner:   . Emotionally Abused:   Marland Kitchen Physically Abused:   . Sexually Abused:     Family History  Problem Relation Age of Onset  . Hypertension Mother   . Cancer Maternal Aunt   . Heart disease Paternal Grandmother     Review of Systems  Constitutional: Negative.   HENT: Negative.   Eyes: Negative.   Respiratory: Negative.   Cardiovascular: Negative.   Gastrointestinal: Negative.   Genitourinary: Negative.   Musculoskeletal: Negative.   Skin: Negative.   Neurological: Negative.   Psychiatric/Behavioral: Negative.      Physical Exam BP (!) 153/88   Pulse 69   Ht 5\' 2"  (1.575 m)   Wt 152 lb (68.9 kg)   LMP 12/21/2019    Breastfeeding No   BMI 27.80 kg/m   Physical Exam Constitutional:      General: She is not in acute distress.    Appearance: Normal appearance. She is well-developed.  HENT:     Head: Normocephalic and atraumatic.  Eyes:     General: No scleral icterus.    Conjunctiva/sclera: Conjunctivae normal.  Cardiovascular:     Rate and Rhythm: Normal rate and regular rhythm.     Heart sounds: No murmur heard.  No friction rub. No gallop.   Pulmonary:     Effort: Pulmonary effort is normal. No respiratory distress.     Breath sounds: Normal breath sounds. No wheezing or rales.  Abdominal:     General: Bowel sounds are normal. There is no distension.     Palpations: Abdomen is soft. There is no mass.     Tenderness: There is no abdominal tenderness. There is no guarding or rebound.     Comments: without erythema, induration, warmth, and tenderness. It is clean, dry, and intact.    Musculoskeletal:        General: Normal range of motion.     Cervical back: Normal range of motion and neck supple.  Neurological:     General: No focal deficit present.     Mental Status: She is alert and oriented to person, place, and time.     Cranial Nerves: No cranial nerve deficit.  Skin:    General: Skin is warm and dry.     Findings: No erythema.  Psychiatric:        Mood and Affect: Mood normal.        Behavior: Behavior normal.        Judgment: Judgment normal.      Female Chaperone present during breast and/or pelvic exam.  Assessment: 24 y.o. G1P1001 presenting for 6 week postpartum visit  Plan: Problem List Items Addressed This Visit      Other   Postpartum care following cesarean delivery - Primary   Relevant Medications   medroxyPROGESTERone (DEPO-PROVERA) 150 MG/ML injection    Other Visit Diagnoses    Encounter for initial prescription of injectable contraceptive       Relevant Medications   medroxyPROGESTERone (DEPO-PROVERA) 150 MG/ML injection       1) Contraception  Education given regarding options for contraception, including injectable contraception.  2)  Pap - ASCCP guidelines and rational discussed.  Patient opts for routine screening interval  3) Patient underwent screening for postpartum depression with some concerns noted. She declines treatment at this time. She is encouraged to follow up as needed.   4) Follow up 1 year for routine annual exam  21  Jean Rosenthal, MD 01/09/2020 2:04 PM

## 2020-05-24 ENCOUNTER — Emergency Department
Admission: EM | Admit: 2020-05-24 | Discharge: 2020-05-24 | Disposition: A | Discharge status: Home / Self Care | Payer: Medicaid Other | Attending: Student in an Organized Health Care Education/Training Program | Admitting: Student in an Organized Health Care Education/Training Program

## 2020-05-24 ENCOUNTER — Other Ambulatory Visit: Payer: Self-pay

## 2020-05-24 ENCOUNTER — Encounter: Payer: Self-pay | Admitting: Emergency Medicine

## 2020-05-24 DIAGNOSIS — Z87891 Personal history of nicotine dependence: Secondary | ICD-10-CM | POA: Diagnosis not present

## 2020-05-24 DIAGNOSIS — U071 COVID-19: Secondary | ICD-10-CM | POA: Diagnosis not present

## 2020-05-24 DIAGNOSIS — R6883 Chills (without fever): Secondary | ICD-10-CM | POA: Diagnosis present

## 2020-05-24 LAB — RESP PANEL BY RT-PCR (FLU A&B, COVID) ARPGX2
Influenza A by PCR: NEGATIVE
Influenza B by PCR: NEGATIVE
SARS Coronavirus 2 by RT PCR: POSITIVE — AB

## 2020-05-24 MED ORDER — BENZONATATE 100 MG PO CAPS
100.0000 mg | ORAL_CAPSULE | Freq: Three times a day (TID) | ORAL | 0 refills | Status: DC | PRN
Start: 2020-05-24 — End: 2020-12-28

## 2020-05-24 NOTE — ED Notes (Signed)
First Nurse Note: Pt to ED via [facility], pt reports fever, chills, and sweats since 0300 Pt sister tested + for COVID yesterday.

## 2020-05-24 NOTE — ED Notes (Signed)
Pt reports headache, body aches, chills, loss of taste and smell Pt had exposure to positive covid yesterday

## 2020-05-24 NOTE — ED Provider Notes (Signed)
[facility] Emergency Department Provider Note  ____________________________________________  Time seen: Approximately 12:45 PM  I have reviewed the triage vital signs and the nursing notes.   HISTORY  Chief Complaint Generalized Body Aches and Chills    HPI Carrie [name] is a 24 y.o. female that presents to the emergency department for evaluation of chills, headache, body aches, nasal congestion, nonproductive cough since this morning.  Patient sister tested positive in this emergency department yesterday.  She celebrated with her sister Christmas over the weekend.  Patient has not had a Covid vaccination.  No shortness of breath, chest pain, nausea, vomiting, abdominal pain, diarrhea.  Past Medical History:  Diagnosis Date  . Headache   . Heart murmur    as a baby  . Pneumonia    as a baby    Patient Active Problem List   Diagnosis Date Noted  . Postpartum care following cesarean delivery 11/14/2019  . Buttocks presentation 11/12/2019  . Breech presentation 10/16/2019  . Abdominal pain affecting pregnancy, antepartum 10/08/2019  . Decreased fetal movement affecting management of pregnancy in second trimester 08/21/2019  . Round ligament pain 08/21/2019  . Supervision of normal first pregnancy, antepartum 03/28/2019  . Perirectal abscess     Past Surgical History:  Procedure Laterality Date  . CESAREAN SECTION N/A 11/12/2019   Procedure: CESAREAN SECTION;  Surgeon: Conard Novak, MD;  Location: [facility] ORS;  Service: Obstetrics;  Laterality: N/A;  . INCISION AND DRAINAGE PERIRECTAL ABSCESS N/A 01/21/2016   Procedure: IRRIGATION AND DEBRIDEMENT PERIRECTAL ABSCESS;  Surgeon: Leafy Ro, MD;  Location: [facility] ORS;  Service: General;  Laterality: N/A;    Prior to Admission medications   Medication Sig Start Date End Date Taking? Authorizing Provider  benzonatate (TESSALON PERLES) 100 MG capsule Take 1 capsule (100 mg total) by mouth 3 (three)  times daily as needed. 05/24/20 05/24/21  Enid Derry, PA-C  medroxyPROGESTERone (DEPO-PROVERA) 150 MG/ML injection Inject 1 mL (150 mg total) into the muscle every 3 (three) months. 01/09/20   Conard Novak, MD  Prenat w/o A-FeCbGl-DSS-FA-DHA (CITRANATAL ASSURE) 35-1 & 300 MG tablet Take 2 tablets by mouth daily. Patient taking differently: Take 2 tablets by mouth at bedtime.  07/08/19   Nadara Mustard, MD    Allergies Patient has no known allergies.  Family History  Problem Relation Age of Onset  . Hypertension Mother   . Cancer Maternal Aunt   . Heart disease Paternal Grandmother     Social History Social History   Tobacco Use  . Smoking status: Former Smoker    Packs/day: 0.25    Types: Cigarettes  . Smokeless tobacco: Never Used  Vaping Use  . Vaping Use: Never used  Substance Use Topics  . Alcohol use: No  . Drug use: Yes    Frequency: 14.0 times per week    Types: Marijuana     Review of Systems  Constitutional: Positive for chills. Eyes: No visual changes. No discharge. ENT: Positive for congestion and rhinorrhea. Cardiovascular: No chest pain. Respiratory: Positive for cough. No SOB. Gastrointestinal: No abdominal pain.  No nausea, no vomiting.  No diarrhea.  No constipation. Musculoskeletal: Positive for body aches. Skin: Negative for rash, abrasions, lacerations, ecchymosis. Neurological: Positive for headache.   ____________________________________________   PHYSICAL EXAM:  VITAL SIGNS: ED Triage Vitals  Enc Vitals Group     BP 05/24/20 1201 124/70     Pulse Rate 05/24/20 1201 98     Resp 05/24/20 1201  16     Temp 05/24/20 1201 100.3 F (37.9 C)     Temp Source 05/24/20 1201 Oral     SpO2 05/24/20 1201 98 %     Weight 05/24/20 1202 143 lb (64.9 kg)     Height 05/24/20 1202 5\' 2"  (1.575 m)     Head Circumference --      Peak Flow --      Pain Score 05/24/20 1202 10     Pain Loc --      Pain Edu? --      Excl. in GC? --       Constitutional: Alert and oriented. Well appearing and in no acute distress. Eyes: Conjunctivae are normal. PERRL. EOMI. No discharge. Head: Atraumatic. ENT: No frontal and maxillary sinus tenderness.      Ears: Tympanic membranes pearly gray with good landmarks. No discharge.      Nose: Mild congestion/rhinnorhea.      Mouth/Throat: Mucous membranes are moist. Oropharynx non-erythematous. Tonsils not enlarged. No exudates. Uvula midline. Neck: No stridor.   Hematological/Lymphatic/Immunilogical: No cervical lymphadenopathy. Cardiovascular: Normal rate, regular rhythm.  Good peripheral circulation. Respiratory: Normal respiratory effort without tachypnea or retractions. Lungs CTAB. Good air entry to the bases with no decreased or absent breath sounds. Gastrointestinal: Bowel sounds 4 quadrants. Soft and nontender to palpation. No guarding or rigidity. No palpable masses. No distention. Musculoskeletal: Full range of motion to all extremities. No gross deformities appreciated. Neurologic:  Normal speech and language. No gross focal neurologic deficits are appreciated.  Skin:  Skin is warm, dry and intact. No rash noted. Psychiatric: Mood and affect are normal. Speech and behavior are normal. Patient exhibits appropriate insight and judgement.   ____________________________________________   LABS (all labs ordered are listed, but only abnormal results are displayed)  Labs Reviewed  RESP PANEL BY RT-PCR (FLU A&B, COVID) ARPGX2 - Abnormal; Notable for the following components:      Result Value   SARS Coronavirus 2 by RT PCR POSITIVE (*)    All other components within normal limits   ____________________________________________  EKG   ____________________________________________  RADIOLOGY   No results found.  ____________________________________________    PROCEDURES  Procedure(s) performed:    Procedures    Medications - No data to  display   ____________________________________________   INITIAL IMPRESSION / ASSESSMENT AND PLAN / ED COURSE  Pertinent labs & imaging results that were available during my care of the patient were reviewed by me and considered in my medical decision making (see chart for details).  Review of the [place] CSRS was performed in accordance of the [facility] prior to dispensing any controlled drugs.     Patient's diagnosis is consistent with COVID-19. Vital signs and exam are reassuring. Patient appears well and is staying well hydrated. Patient feels comfortable going home. Patient will be discharged home with prescriptions for tessalon perles. Patient is to follow up with PCP as needed or otherwise directed. Patient is given ED precautions to return to the ED for any worsening or new symptoms.  Carrie [name] was evaluated in Emergency Department on 05/24/2020 for the symptoms described in the history of present illness. She was evaluated in the context of the global COVID-19 pandemic, which necessitated consideration that the patient might be at risk for infection with the SARS-CoV-2 virus that causes COVID-19. Institutional protocols and algorithms that pertain to the evaluation of patients at risk for COVID-19 are in a state of rapid change based on information released by regulatory  bodies including the CDC and federal and state organizations. These policies and algorithms were followed during the patient's care in the ED.   ____________________________________________  FINAL CLINICAL IMPRESSION(S) / ED DIAGNOSES  Final diagnoses:  COVID      NEW MEDICATIONS STARTED DURING THIS VISIT:  ED Discharge Orders         Ordered    benzonatate (TESSALON PERLES) 100 MG capsule  3 times daily PRN        05/24/20 1408              This chart was dictated using voice recognition software/Dragon. Despite best efforts to proofread, errors can occur which can change the meaning. Any change was  purely unintentional.    Enid Derry, PA-C 05/24/20 1457    Willy Eddy, MD 05/24/20 1517

## 2020-05-24 NOTE — ED Triage Notes (Signed)
Pt to ED via [facility] from home for COVID symptoms. Pt sister tested positive yesterday. Pt is in NAD.

## 2020-05-25 ENCOUNTER — Telehealth (HOSPITAL_COMMUNITY): Payer: Self-pay

## 2020-05-25 NOTE — Telephone Encounter (Signed)
Called to Discuss with patient about Covid symptoms and the use of the monoclonal antibody infusion for those with mild to moderate Covid symptoms and at a high risk of hospitalization.     Pt is qualified for this infusion due to co-morbid conditions and/or a member of an at-risk group.     Patient Active Problem List   Diagnosis Date Noted  . Postpartum care following cesarean delivery 11/14/2019  . Buttocks presentation 11/12/2019  . Breech presentation 10/16/2019  . Abdominal pain affecting pregnancy, antepartum 10/08/2019  . Decreased fetal movement affecting management of pregnancy in second trimester 08/21/2019  . Round ligament pain 08/21/2019  . Supervision of normal first pregnancy, antepartum 03/28/2019  . Perirectal abscess     Patient declines infusion at this time. Symptoms tier reviewed as well as criteria for ending isolation. Preventative practices reviewed. Patient verbalized understanding.    Patient advised to call back if he/she decides that he/she does want to get infusion. Callback number to the infusion center given. Patient advised to go to Urgent care or ED with severe symptoms.

## 2020-05-30 HISTORY — DX: Maternal care for unspecified type scar from previous cesarean delivery: O34.219

## 2020-06-02 ENCOUNTER — Encounter: Payer: Self-pay | Admitting: Family Medicine

## 2020-06-02 ENCOUNTER — Ambulatory Visit: Payer: Medicaid Other | Admitting: Family Medicine

## 2020-06-02 ENCOUNTER — Other Ambulatory Visit: Payer: Self-pay

## 2020-06-02 DIAGNOSIS — Z113 Encounter for screening for infections with a predominantly sexual mode of transmission: Secondary | ICD-10-CM

## 2020-06-02 NOTE — Progress Notes (Signed)
S: Patient in clinic with concerns for bumps on lips.  Patient noticed bumps on lips on 12/26.  Patient reports that lips have been dry and has not used any type of lip moisturizer to help.  Patient has also been sleeping with mouth open and in mask.  Denies oral sex in the past 2 months and same partner for > year.    O:   No bleeding, or discharge.  Denies pain.  Denies hx of HSV.   A:  scabbed over pace on left side of upper lip. Abrasion  was not that of herpetic nature.  Unable to test to rule out HSV due to dryness of abrasion.    P:  Patient declined any other services, Birth control  or screenings for STI today and is concerned with lip. Patient did not reveal concerns with lip until provider was doing history interview.   Spent > 5 mins counseling patient on winter skin care. Encouraged patient to use lip moisturizer several time a day and to not pick at scab.   Patient verbalized understanding.        Wendi Snipes, FNP

## 2020-10-20 IMAGING — US US PELVIS COMPLETE WITH TRANSVAGINAL
1 series · 13 of 25 positions shown · non-contrast
Comparison: Obstetrical ultrasound 10/23/2019

CLINICAL DATA: Heavy vaginal bleeding, Caesarean section 11/12/2019



[Series 1: us pelvic complete with transvaginal · 13 of 140 slices shown]
[im 1/140]
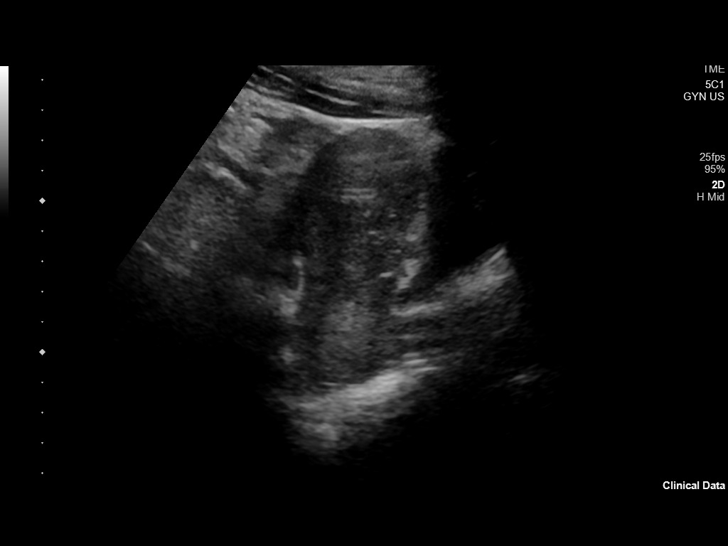
[im 12/140]
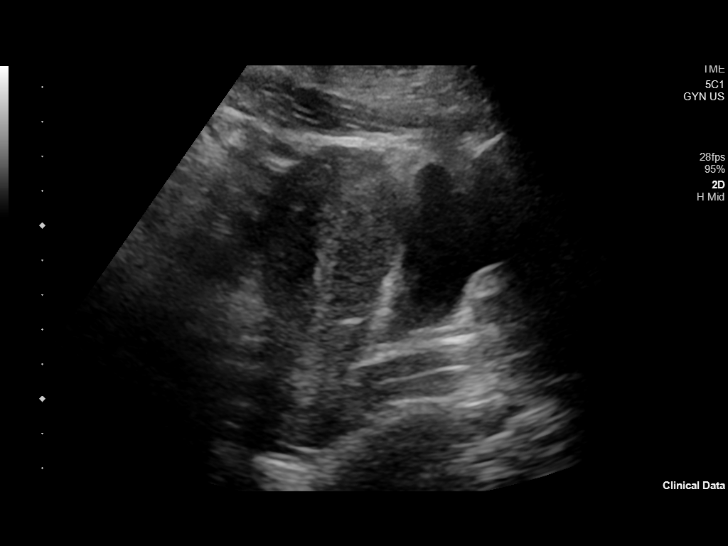
[im 24/140]
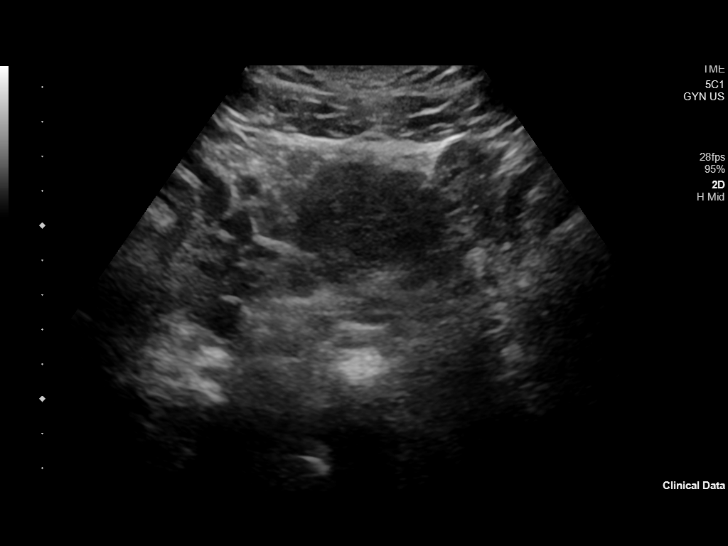
[im 35/140]
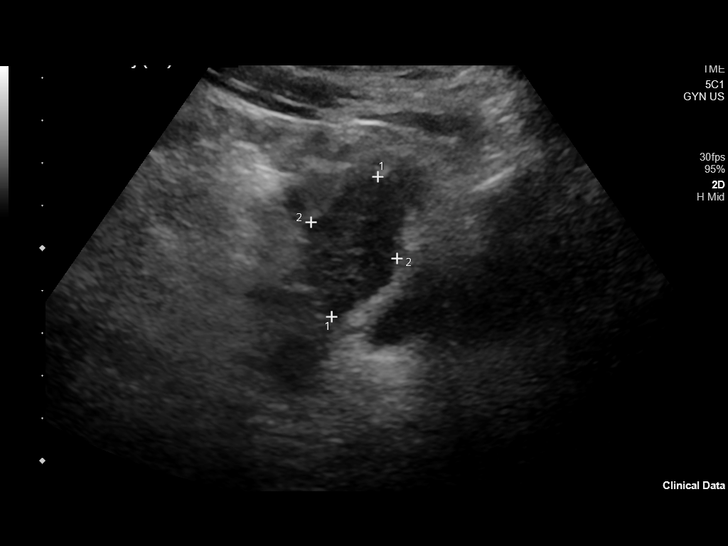
[im 47/140]
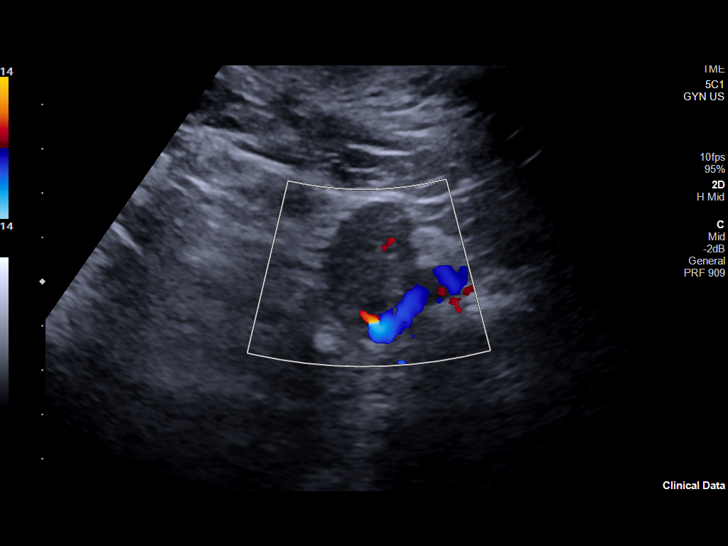
[im 58/140]
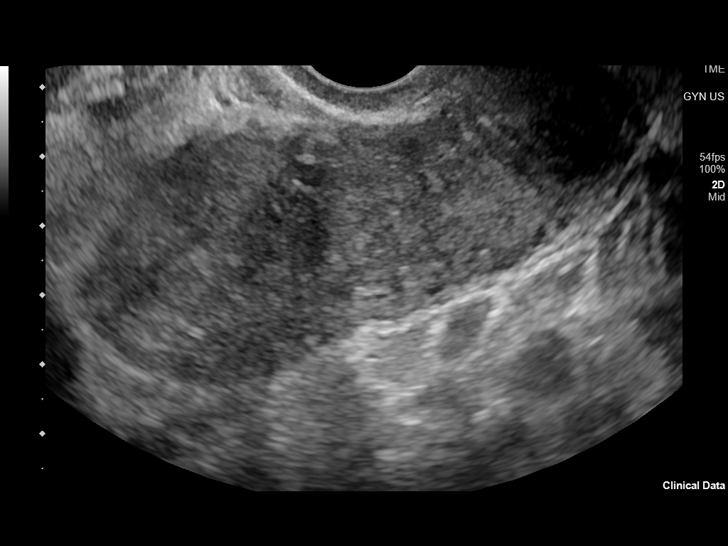
[im 70/140]
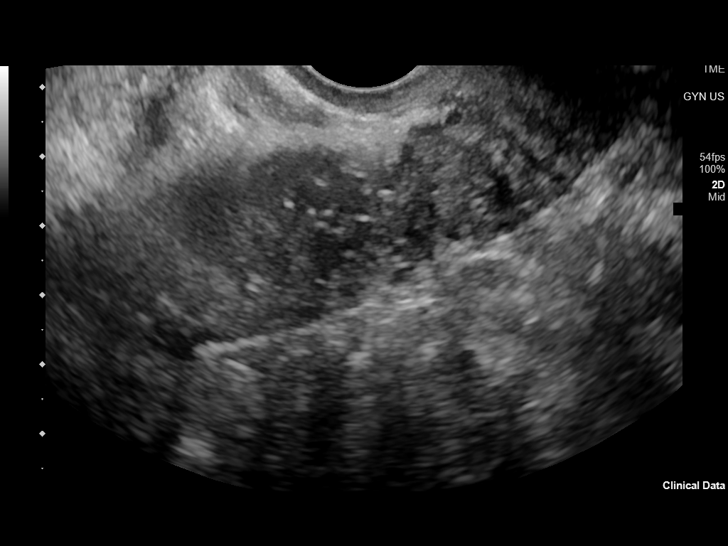
[im 82/140]
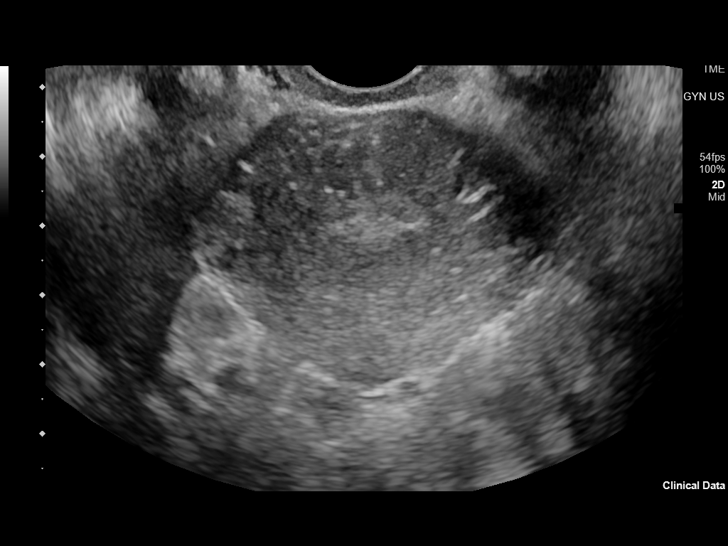
[im 93/140]
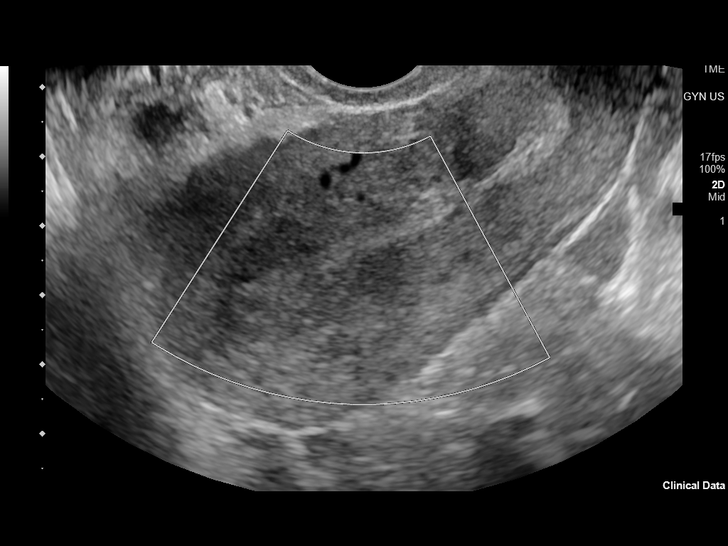
[im 105/140]
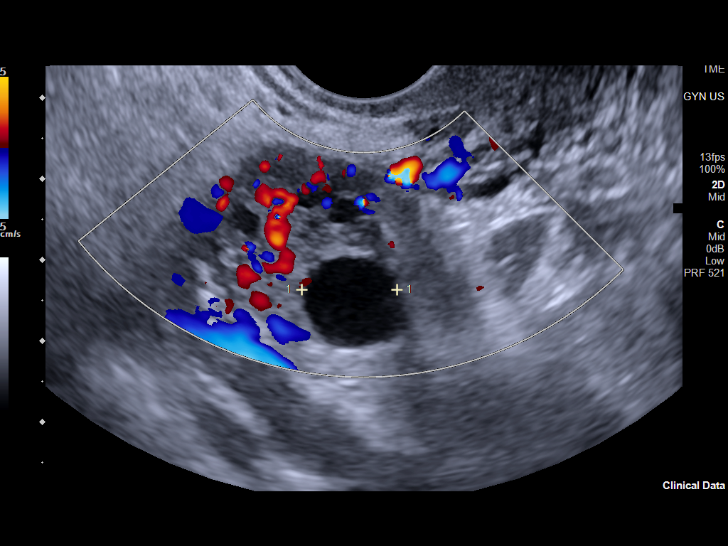
[im 116/140]
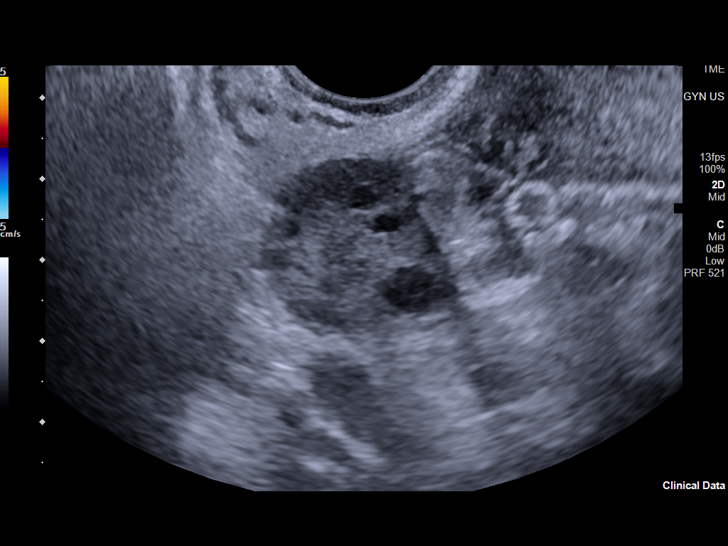
[im 128/140]
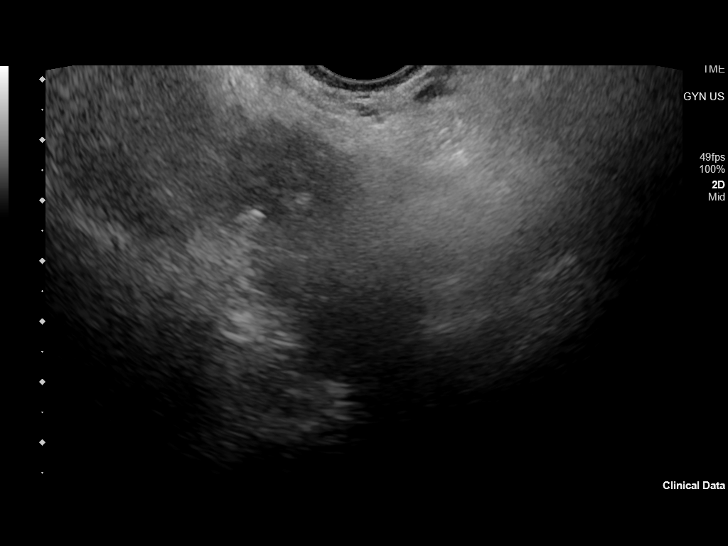
[im 140/140]
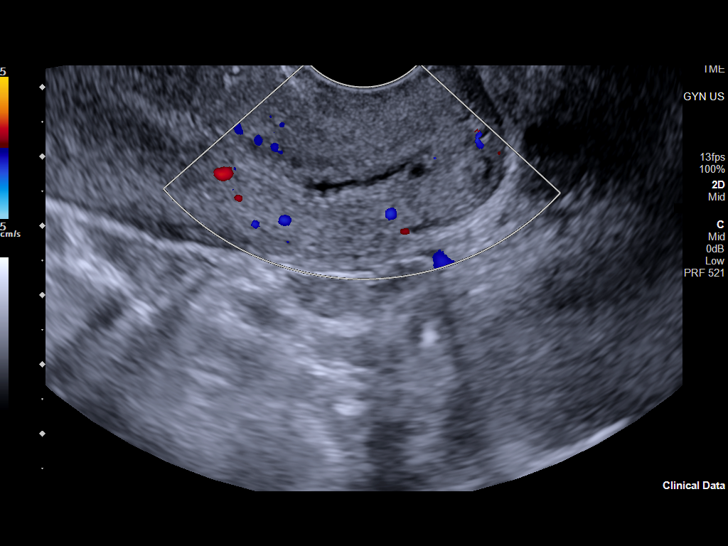

[13 of 25 positions shown; findings below may reference images not displayed]

FINDINGS: Uterus

Measurements: 8.9 x 4.2 x 5.7 cm = volume: 111.4 mL. Anteverted
uterus. Echogenic focus towards the anterior lower uterine segment
may be related to recent Caesarean. No evidence of dehiscence or
other acute complication. No large intramural or bladder flap
hematoma. Small amount of low-attenuation fluid towards the cervical
canal could reflect a small amount of blood products. No convincing
evidence of retained products.

Endometrium

Thickness: 7.5 mm, non thickened.  No focal abnormality visualized.

Right ovary

Measurements: 3.4 x 2.5 x 2.3 cm = volume: 10 mL. 1.2 cm dominant
follicle in the right ovary.

Left ovary

Measurements: 3.5 x 2.7 x 2.0 cm = volume: 9.8 mL. Normal
appearance/no adnexal mass.

Other findings

No abnormal free fluid.
IMPRESSION: Trace endometrial fluid towards the lower uterine segment with
minimal internal echoes could reflect some trace hemorrhagic
products but without convincing evidence of retained products of
conception or other post Caesarean complication.

## 2020-12-03 ENCOUNTER — Other Ambulatory Visit: Payer: Self-pay

## 2020-12-03 ENCOUNTER — Ambulatory Visit (LOCAL_COMMUNITY_HEALTH_CENTER): Payer: Medicaid Other

## 2020-12-03 VITALS — BP 124/68 | Ht 62.0 in | Wt 149.0 lb

## 2020-12-03 DIAGNOSIS — Z3201 Encounter for pregnancy test, result positive: Secondary | ICD-10-CM

## 2020-12-03 LAB — PREGNANCY, URINE: Preg Test, Ur: POSITIVE — AB

## 2020-12-03 MED ORDER — PRENATAL 27-0.8 MG PO TABS: 1.0000 | ORAL_TABLET | Freq: Every day | ORAL | 0 refills | Status: AC

## 2020-12-03 NOTE — Progress Notes (Signed)
UPT positive. Plans prenatal care at [facility]. To DSS clerk for Medicaid/Preg women. Jerel Shepherd, RN

## 2020-12-14 ENCOUNTER — Other Ambulatory Visit: Payer: Self-pay

## 2020-12-14 DIAGNOSIS — Z87891 Personal history of nicotine dependence: Secondary | ICD-10-CM | POA: Diagnosis not present

## 2020-12-14 DIAGNOSIS — J029 Acute pharyngitis, unspecified: Secondary | ICD-10-CM | POA: Diagnosis not present

## 2020-12-14 DIAGNOSIS — O99511 Diseases of the respiratory system complicating pregnancy, first trimester: Secondary | ICD-10-CM | POA: Diagnosis not present

## 2020-12-14 DIAGNOSIS — Z3A09 9 weeks gestation of pregnancy: Secondary | ICD-10-CM | POA: Diagnosis not present

## 2020-12-14 DIAGNOSIS — O2341 Unspecified infection of urinary tract in pregnancy, first trimester: Secondary | ICD-10-CM | POA: Diagnosis not present

## 2020-12-14 DIAGNOSIS — Z20822 Contact with and (suspected) exposure to covid-19: Secondary | ICD-10-CM | POA: Insufficient documentation

## 2020-12-14 DIAGNOSIS — R059 Cough, unspecified: Secondary | ICD-10-CM | POA: Insufficient documentation

## 2020-12-14 DIAGNOSIS — R102 Pelvic and perineal pain: Secondary | ICD-10-CM | POA: Diagnosis not present

## 2020-12-14 DIAGNOSIS — O26891 Other specified pregnancy related conditions, first trimester: Secondary | ICD-10-CM | POA: Diagnosis present

## 2020-12-14 LAB — CBC
HCT: 37.6 % (ref 36.0–46.0)
Hemoglobin: 13 g/dL (ref 12.0–15.0)
MCH: 30.6 pg (ref 26.0–34.0)
MCHC: 34.6 g/dL (ref 30.0–36.0)
MCV: 88.5 fL (ref 80.0–100.0)
Platelets: 329 10*3/uL (ref 150–400)
RBC: 4.25 MIL/uL (ref 3.87–5.11)
RDW: 13.7 % (ref 11.5–15.5)
WBC: 11.7 10*3/uL — ABNORMAL HIGH (ref 4.0–10.5)
nRBC: 0 % (ref 0.0–0.2)

## 2020-12-14 LAB — POC URINE PREG, ED: Preg Test, Ur: POSITIVE — AB

## 2020-12-14 NOTE — ED Triage Notes (Signed)
Pt in with co low back pain for 2 days, also co lower abd pain for a few weeks. Pt recently found out she is approx [redacted] weeks pregnant. Denies any vaginal bleeding, Pt has had sore throat and cough for 2 days.

## 2020-12-15 ENCOUNTER — Emergency Department: Payer: Medicaid Other

## 2020-12-15 ENCOUNTER — Emergency Department
Admission: EM | Admit: 2020-12-15 | Discharge: 2020-12-15 | Disposition: A | Discharge status: Home / Self Care | Payer: Medicaid Other | Attending: Emergency Medicine | Admitting: Emergency Medicine

## 2020-12-15 DIAGNOSIS — R1030 Lower abdominal pain, unspecified: Secondary | ICD-10-CM

## 2020-12-15 DIAGNOSIS — N390 Urinary tract infection, site not specified: Secondary | ICD-10-CM

## 2020-12-15 DIAGNOSIS — Z3491 Encounter for supervision of normal pregnancy, unspecified, first trimester: Secondary | ICD-10-CM

## 2020-12-15 DIAGNOSIS — R102 Pelvic and perineal pain: Secondary | ICD-10-CM

## 2020-12-15 LAB — COMPREHENSIVE METABOLIC PANEL
ALT: 16 U/L (ref 0–44)
AST: 18 U/L (ref 15–41)
Albumin: 3.8 g/dL (ref 3.5–5.0)
Alkaline Phosphatase: 48 U/L (ref 38–126)
Anion gap: 5 (ref 5–15)
BUN: 8 mg/dL (ref 6–20)
CO2: 24 mmol/L (ref 22–32)
Calcium: 8.8 mg/dL — ABNORMAL LOW (ref 8.9–10.3)
Chloride: 106 mmol/L (ref 98–111)
Creatinine, Ser: 0.84 mg/dL (ref 0.44–1.00)
GFR, Estimated: >60 mL/min (ref >60)
Glucose, Bld: 120 mg/dL — ABNORMAL HIGH (ref 70–99)
Potassium: 3.3 mmol/L — ABNORMAL LOW (ref 3.5–5.1)
Sodium: 135 mmol/L (ref 135–145)
Total Bilirubin: 0.7 mg/dL (ref 0.3–1.2)
Total Protein: 7.2 g/dL (ref 6.5–8.1)

## 2020-12-15 LAB — URINALYSIS, COMPLETE (UACMP) WITH MICROSCOPIC
Bilirubin Urine: NEGATIVE
Glucose, UA: NEGATIVE mg/dL
Hgb urine dipstick: NEGATIVE
Ketones, ur: NEGATIVE mg/dL
Nitrite: NEGATIVE
Protein, ur: 30 mg/dL — AB
Specific Gravity, Urine: 1.028 (ref 1.005–1.030)
Squamous Epithelial / HPF: >50 — ABNORMAL HIGH (ref 0–5)
pH: 7 (ref 5.0–8.0)

## 2020-12-15 LAB — SARS CORONAVIRUS 2 (TAT 6-24 HRS): SARS Coronavirus 2: NEGATIVE

## 2020-12-15 LAB — HCG, QUANTITATIVE, PREGNANCY: hCG, Beta Chain, Quant, S: 104652 m[IU]/mL — ABNORMAL HIGH (ref <5)

## 2020-12-15 MED ORDER — ONDANSETRON 4 MG PO TBDP
4.0000 mg | ORAL_TABLET | Freq: Three times a day (TID) | ORAL | 0 refills | Status: DC | PRN
Start: 2020-12-15 — End: 2021-05-10

## 2020-12-15 MED ORDER — CEPHALEXIN 500 MG PO CAPS
500.0000 mg | ORAL_CAPSULE | Freq: Three times a day (TID) | ORAL | 0 refills | Status: DC
Start: 2020-12-15 — End: 2020-12-28

## 2020-12-15 MED ORDER — CEPHALEXIN 500 MG PO CAPS
500.0000 mg | ORAL_CAPSULE | Freq: Once | ORAL | Status: AC
  Administered 2020-12-15: 500 mg via ORAL
  Filled 2020-12-15: qty 1

## 2020-12-15 NOTE — Discharge Instructions (Addendum)
1.  Take antibiotic as prescribed (Keflex 500 mg  3 times daily x7 days). 2.  You may take Zofran as needed for nausea/vomiting. 3.  Return to the ER for worsening symptoms, persistent vomiting, fever, difficulty breathing or other concerns

## 2020-12-15 NOTE — ED Provider Notes (Signed)
[facility] Emergency Department Provider Note   ____________________________________________   Event Date/Time   First MD Initiated Contact with Patient 12/15/20 0148     (approximate)  I have reviewed the triage vital signs and the nursing notes.   HISTORY  Chief Complaint Abdominal Pain and Back Pain    HPI Carrie [name] is a 25 y.o. female G2 P1 approximately [redacted] weeks pregnant by dates who presents to the ED from home with a chief complaint of suprapubic abdominal pain times a few weeks and low back pain x2 days.  Denies trauma/fall/injury.  Denies vaginal bleeding.  Endorses dry cough and sore throat x2 days.  Denies fever, chills, chest pain, shortness of breath.  Denies STD concerns.     Past Medical History:  Diagnosis Date   Headache    Heart murmur    as a baby   Pneumonia    as a baby    Patient Active Problem List   Diagnosis Date Noted   Postpartum care following cesarean delivery 11/14/2019   Buttocks presentation 11/12/2019   Breech presentation 10/16/2019   Abdominal pain affecting pregnancy, antepartum 10/08/2019   Decreased fetal movement affecting management of pregnancy in second trimester 08/21/2019   Round ligament pain 08/21/2019   Supervision of normal first pregnancy, antepartum 03/28/2019   Perirectal abscess     Past Surgical History:  Procedure Laterality Date   CESAREAN SECTION N/A 11/12/2019   Procedure: CESAREAN SECTION;  Surgeon: Conard NovakJackson, Stephen D, MD;  Location: [facility] ORS;  Service: Obstetrics;  Laterality: N/A;   INCISION AND DRAINAGE PERIRECTAL ABSCESS N/A 01/21/2016   Procedure: IRRIGATION AND DEBRIDEMENT PERIRECTAL ABSCESS;  Surgeon: Leafy Roiego F Pabon, MD;  Location: [facility] ORS;  Service: General;  Laterality: N/A;    Prior to Admission medications   Medication Sig Start Date End Date Taking? Authorizing Provider  cephALEXin (KEFLEX) 500 MG capsule Take 1 capsule (500 mg total) by mouth 3 (three) times  daily. 12/15/20  Yes Irean HongSung, Brittie Whisnant J, MD  ondansetron (ZOFRAN ODT) 4 MG disintegrating tablet Take 1 tablet (4 mg total) by mouth every 8 (eight) hours as needed for nausea or vomiting. 12/15/20  Yes Irean HongSung, Horace Lukas J, MD  benzonatate (TESSALON PERLES) 100 MG capsule Take 1 capsule (100 mg total) by mouth 3 (three) times daily as needed. Patient not taking: Reported on 06/02/2020 05/24/20 05/24/21  Enid DerryWagner, Ashley, PA-C  medroxyPROGESTERone (DEPO-PROVERA) 150 MG/ML injection Inject 1 mL (150 mg total) into the muscle every 3 (three) months. Patient not taking: Reported on 06/02/2020 01/09/20   Conard NovakJackson, Stephen D, MD  Prenat w/o A-FeCbGl-DSS-FA-DHA (CITRANATAL ASSURE) 35-1 & 300 MG tablet Take 2 tablets by mouth daily. Patient not taking: Reported on 06/02/2020 07/08/19   Nadara MustardHarris, Robert P, MD  Prenatal Vit-Fe Fumarate-FA (MULTIVITAMIN-PRENATAL) 27-0.8 MG TABS tablet Take 1 tablet by mouth daily at 12 noon. 12/03/20 03/13/21  Federico FlakeNewton, Kimberly Niles, MD    Allergies Patient has no known allergies.  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 ENT: Positive for sore throat. Cardiovascular: Denies chest pain. Respiratory: Positive for cough.  Denies shortness of breath. Gastrointestinal: Positive for suprapubic abdominal pain.  No nausea, no vomiting.  No diarrhea.  No constipation. Genitourinary: Negative for  dysuria. Musculoskeletal: Negative for back pain. Skin: Negative for rash. Neurological: Negative for headaches, focal weakness or numbness.   ____________________________________________   PHYSICAL EXAM:  VITAL SIGNS: ED Triage Vitals  Enc Vitals Group     BP 12/14/20 2337 126/71     Pulse Rate 12/14/20 2337 (!) 103     Resp 12/14/20 2337 20     Temp 12/14/20 2337 98.6 F (37 C)     Temp Source 12/14/20 2337 Oral     SpO2 12/14/20 2337 100 %     Weight 12/14/20 2338 140 lb (63.5 kg)     Height 12/14/20 2338 5\' 2"  (1.575 m)     Head Circumference --      Peak Flow --      Pain Score 12/14/20 2338 8     Pain Loc --      Pain Edu? --      Excl. in GC? --     Constitutional: Alert and oriented. Well appearing and in no acute distress. Eyes: Conjunctivae are normal. PERRL. EOMI. Head: Atraumatic. Nose: No congestion/rhinnorhea. Mouth/Throat: Mucous membranes are moist.  Oropharynx slightly erythematous without tonsillar swelling, exudates or peritonsillar abscess.  Phonation normal.  There is no hoarse or muffled voice.  There is no drooling. Neck: No stridor.   Cardiovascular: Normal rate, regular rhythm. Grossly normal heart sounds.  Good peripheral circulation. Respiratory: Normal respiratory effort.  No retractions. Lungs CTAB. Gastrointestinal: Soft and nontender to light or deep palpation. No distention. No abdominal bruits. No CVA tenderness. Musculoskeletal: No lower extremity tenderness nor edema.  No joint effusions. Neurologic:  Normal speech and language. No gross focal neurologic deficits are appreciated. No gait instability. Skin:  Skin is warm, dry and intact. No rash noted. Psychiatric: Mood and affect are normal. Speech and behavior are normal.  ____________________________________________   LABS (all labs ordered are listed, but only abnormal results are displayed)  Labs Reviewed  CBC - Abnormal; Notable for the following components:      Result Value    WBC 11.7 (*)    All other components within normal limits  COMPREHENSIVE METABOLIC PANEL - Abnormal; Notable for the following components:   Potassium 3.3 (*)    Glucose, Bld 120 (*)    Calcium 8.8 (*)    All other components within normal limits  HCG, QUANTITATIVE, PREGNANCY - Abnormal; Notable for the following components:   hCG, Beta Chain, Quant, S 2339 (*)    All other components within normal limits  URINALYSIS, COMPLETE (UACMP) WITH MICROSCOPIC - Abnormal; Notable for the following components:   Color, Urine AMBER (*)    APPearance TURBID (*)    Protein, ur 30 (*)    Leukocytes,Ua MODERATE (*)    Bacteria, UA FEW (*)    Squamous Epithelial / LPF >50 (*)    All other components within normal limits  POC URINE PREG, ED - Abnormal; Notable for the following components:   Preg Test, Ur POSITIVE (*)    All other  components within normal limits  SARS CORONAVIRUS 2 (TAT 6-24 HRS)  URINE CULTURE   ____________________________________________  EKG  None ____________________________________________  RADIOLOGY I, Gertude Benito J, personally viewed and evaluated these images (plain radiographs) as part of my medical decision making, as well as reviewing the written report by the radiologist.  ED MD interpretation: IUP 8 weeks 5 days  Official radiology report(s): US OB LESS THAN 14 WEEKS W/ OB TRANSVAGINAL AND DOPPLER  Result Date: 12/15/2020 CLINICAL DATA:  Pregnant, pelvic pain EXAM: OBSTETRIC <14 WK [place] AND TRANSVAGINAL OB US DOPPLER ULTRASOUND OF OVARIES TECHNIQUE: Both transabdominal and transvaginal ultrasound examinations were performed for complete evaluation of the gestation as well as the maternal uterus, adnexal regions, and pelvic cul-de-sac. Transvaginal technique was performed to assess early pregnancy. Color and duplex Doppler ultrasound was utilized to evaluate blood flow to the ovaries. COMPARISON:  None. FINDINGS: Intrauterine gestational sac: Single Yolk sac:   Visualized. Embryo:  Visualized. Cardiac Activity: Visualized. Heart Rate: 169 bpm CRL:   20.6 mm   8 w 5 d                  [place] EDC: 07/22/2021 Subchorionic hemorrhage:  None visualized. Maternal uterus/adnexae: Bilateral ovaries are within normal limits, noting a small right corpus luteum. Pulsed Doppler evaluation of both ovaries demonstrates normal appearing low-resistance arterial and venous waveforms. IMPRESSION: Single live intrauterine gestation, with estimated gestational age [redacted] weeks 5 days by crown-rump length. No evidence of ovarian torsion. Electronically Signed   By: Charline Bills M.D.   On: 12/15/2020 02:41    ____________________________________________   PROCEDURES  Procedure(s) performed (including Critical Care):  Procedures   ____________________________________________   INITIAL IMPRESSION / ASSESSMENT AND PLAN / ED COURSE  As part of my medical decision making, I reviewed the following data within the electronic MEDICAL RECORD NUMBER Nursing notes reviewed and incorporated, Labs reviewed, Old chart reviewed, Radiograph reviewed, and Notes from prior ED visits     25 year old G2 P1 approximately [redacted] weeks pregnant by dates presenting with suprapubic abdominal pain, also dry cough and sore throat. Differential diagnosis includes, but is not limited to, ovarian cyst, ovarian torsion, acute appendicitis, diverticulitis, urinary tract infection/pyelonephritis, endometriosis, bowel obstruction, colitis, renal colic, gastroenteritis, hernia, fibroids, endometriosis, pregnancy related pain including ectopic pregnancy, etc.   Laboratory results remarkable for leukocyte positive UTI; awaiting results of ultrasound.  Will obtain COVID-19 swab for complaints of cough and sore throat.  Clinical Course as of 12/15/20 0611  Tue Dec 15, 2020  9741 Updated patient on ultrasound result.  Will start a course of antibiotics for UTI and patient will follow up with OB/GYN this week.  Will  provide prescription for Zofran to use as needed for morning sickness.  Strict return precautions given.  Patient verbalizes understanding agrees with plan of care. [JS]    Clinical Course User Index [JS] Irean Hong, MD     ____________________________________________   FINAL CLINICAL IMPRESSION(S) / ED DIAGNOSES  Final diagnoses:  Lower urinary tract infectious disease  Lower abdominal pain  First trimester pregnancy     ED Discharge Orders          Ordered    cephALEXin (KEFLEX) 500 MG capsule  3 times daily        12/15/20 0359    ondansetron (ZOFRAN ODT) 4 MG disintegrating tablet  Every 8 hours PRN        12/15/20 0400  Note:  This document was prepared using Dragon voice recognition software and may include unintentional dictation errors.    Irean Hong, MD 12/15/20 [phone]

## 2020-12-16 LAB — URINE CULTURE: Culture: 60000 — AB

## 2020-12-28 ENCOUNTER — Other Ambulatory Visit: Payer: Self-pay

## 2020-12-28 ENCOUNTER — Emergency Department
Admission: EM | Admit: 2020-12-28 | Discharge: 2020-12-28 | Disposition: A | Discharge status: Home / Self Care | Payer: Medicaid Other | Attending: Emergency Medicine | Admitting: Emergency Medicine

## 2020-12-28 ENCOUNTER — Emergency Department: Payer: Medicaid Other

## 2020-12-28 DIAGNOSIS — R0602 Shortness of breath: Secondary | ICD-10-CM | POA: Diagnosis present

## 2020-12-28 DIAGNOSIS — Z87891 Personal history of nicotine dependence: Secondary | ICD-10-CM | POA: Diagnosis not present

## 2020-12-28 DIAGNOSIS — U071 COVID-19: Secondary | ICD-10-CM

## 2020-12-28 LAB — CBC
HCT: 36.3 % (ref 36.0–46.0)
Hemoglobin: 12.9 g/dL (ref 12.0–15.0)
MCH: 30.9 pg (ref 26.0–34.0)
MCHC: 35.5 g/dL (ref 30.0–36.0)
MCV: 86.8 fL (ref 80.0–100.0)
Platelets: 365 10*3/uL (ref 150–400)
RBC: 4.18 MIL/uL (ref 3.87–5.11)
RDW: 13.2 % (ref 11.5–15.5)
WBC: 13.6 10*3/uL — ABNORMAL HIGH (ref 4.0–10.5)
nRBC: 0 % (ref 0.0–0.2)

## 2020-12-28 LAB — BASIC METABOLIC PANEL
Anion gap: 12 (ref 5–15)
BUN: 7 mg/dL (ref 6–20)
CO2: 21 mmol/L — ABNORMAL LOW (ref 22–32)
Calcium: 9.3 mg/dL (ref 8.9–10.3)
Chloride: 101 mmol/L (ref 98–111)
Creatinine, Ser: 0.65 mg/dL (ref 0.44–1.00)
GFR, Estimated: >60 mL/min (ref >60)
Glucose, Bld: 112 mg/dL — ABNORMAL HIGH (ref 70–99)
Potassium: 3.4 mmol/L — ABNORMAL LOW (ref 3.5–5.1)
Sodium: 134 mmol/L — ABNORMAL LOW (ref 135–145)

## 2020-12-28 LAB — RESP PANEL BY RT-PCR (FLU A&B, COVID) ARPGX2
Influenza A by PCR: NEGATIVE
Influenza B by PCR: NEGATIVE
SARS Coronavirus 2 by RT PCR: POSITIVE — AB

## 2020-12-28 LAB — GROUP A STREP BY PCR: Group A Strep by PCR: NOT DETECTED

## 2020-12-28 LAB — TROPONIN I (HIGH SENSITIVITY): Troponin I (High Sensitivity): 8 ng/L (ref <18)

## 2020-12-28 LAB — TSH: TSH: 0.69 u[IU]/mL (ref 0.350–4.500)

## 2020-12-28 MED ORDER — ALUM & MAG HYDROXIDE-SIMETH 200-200-20 MG/5ML PO SUSP
30.0000 mL | Freq: Once | ORAL | Status: AC
  Administered 2020-12-28: 30 mL via ORAL
  Filled 2020-12-28: qty 30

## 2020-12-28 MED ORDER — LIDOCAINE VISCOUS HCL 2 % MT SOLN
15.0000 mL | Freq: Once | OROMUCOSAL | Status: AC
  Administered 2020-12-28: 15 mL via ORAL
  Filled 2020-12-28: qty 15

## 2020-12-28 NOTE — Discharge Instructions (Addendum)
Follow-up with your regular doctor or OB/GYN to discuss treatment options for covid.  At this time we would just use over-the-counter measures such as Tylenol, salt water gargles. Return to the emergency department if you are worsening.

## 2020-12-28 NOTE — ED Provider Notes (Signed)
[facility] Emergency Department Provider Note  ____________________________________________   Event Date/Time   First MD Initiated Contact with Patient 12/28/20 0935     (approximate)  I have reviewed the triage vital signs and the nursing notes.   HISTORY  Chief Complaint Shortness of Breath and Chest Pain    HPI Carrie [name] is a 25 y.o. female presents emergency department complaining of a sore throat for about 3 days.  Patient states that she had the same symptoms previously and her COVID test was negative.  Patient states she is having a lot of trouble swallowing.  States it burns down through the center of her chest.  No fever or chills.  No chest pain.  States she feels short of breath when she feels that she cannot breathe through her mouth.  States she is only able to breathe through her nose.  Patient is [redacted] weeks pregnant.  Past Medical History:  Diagnosis Date   Headache    Heart murmur    as a baby   Pneumonia    as a baby    Patient Active Problem List   Diagnosis Date Noted   Postpartum care following cesarean delivery 11/14/2019   Buttocks presentation 11/12/2019   Breech presentation 10/16/2019   Abdominal pain affecting pregnancy, antepartum 10/08/2019   Decreased fetal movement affecting management of pregnancy in second trimester 08/21/2019   Round ligament pain 08/21/2019   Supervision of normal first pregnancy, antepartum 03/28/2019   Perirectal abscess     Past Surgical History:  Procedure Laterality Date   CESAREAN SECTION N/A 11/12/2019   Procedure: CESAREAN SECTION;  Surgeon: Conard Novak, MD;  Location: [facility] ORS;  Service: Obstetrics;  Laterality: N/A;   INCISION AND DRAINAGE PERIRECTAL ABSCESS N/A 01/21/2016   Procedure: IRRIGATION AND DEBRIDEMENT PERIRECTAL ABSCESS;  Surgeon: Leafy Ro, MD;  Location: [facility] ORS;  Service: General;  Laterality: N/A;    Prior to Admission medications   Medication Sig  Start Date End Date Taking? Authorizing Provider  ondansetron (ZOFRAN ODT) 4 MG disintegrating tablet Take 1 tablet (4 mg total) by mouth every 8 (eight) hours as needed for nausea or vomiting. 12/15/20   Irean Hong, MD  Prenatal Vit-Fe Fumarate-FA (MULTIVITAMIN-PRENATAL) 27-0.8 MG TABS tablet Take 1 tablet by mouth daily at 12 noon. 12/03/20 03/13/21  Federico Flake, MD    Allergies Patient has no known allergies.  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 ENT: Positive sore throat. Respiratory: Denies cough Cardiovascular: Denies chest pain Gastrointestinal: Denies abdominal pain Genitourinary: Negative for dysuria. Musculoskeletal: Negative for back pain. Skin: Negative for rash. Psychiatric: no mood changes,     ____________________________________________   PHYSICAL EXAM:  VITAL SIGNS: ED Triage  Vitals  Enc Vitals Group     BP 12/28/20 0908 113/78     Pulse Rate 12/28/20 0908 85     Resp 12/28/20 0908 18     Temp 12/28/20 0908 99 F (37.2 C)     Temp Source 12/28/20 0908 Oral     SpO2 12/28/20 0908 100 %     Weight 12/28/20 0909 148 lb (67.1 kg)     Height 12/28/20 0909 5\' 2"  (1.575 m)     Head Circumference --      Peak Flow --      Pain Score 12/28/20 0908 10     Pain Loc --      Pain Edu? --      Excl. in GC? --     Constitutional: Alert and  oriented. Well appearing and in no acute distress. Eyes: Conjunctivae are normal.  Head: Atraumatic. Nose: No congestion/rhinnorhea. Mouth/Throat: Mucous membranes are moist.  Throat appears normal Neck:  supple no lymphadenopathy noted Cardiovascular: Normal rate, regular rhythm. Heart sounds are normal Respiratory: Normal respiratory effort.  No retractions, lungs c t a  GU: deferred Musculoskeletal: FROM all extremities, warm and well perfused Neurologic:  Normal speech and language.  Skin:  Skin is warm, dry and intact. No rash noted. Psychiatric: Mood and affect are normal. Speech and behavior are normal.  ____________________________________________   LABS (all labs ordered are listed, but only abnormal results are displayed)  Labs Reviewed  RESP PANEL BY RT-PCR (FLU A&B, COVID) ARPGX2 - Abnormal; Notable for the following components:      Result Value   SARS Coronavirus 2 by RT PCR POSITIVE (*)    All other components within normal limits  CBC - Abnormal; Notable for the following components:   WBC 13.6 (*)    All other components within normal limits  BASIC METABOLIC PANEL - Abnormal; Notable for the following components:   Sodium 134 (*)    Potassium 3.4 (*)    CO2 21 (*)    Glucose, Bld 112 (*)    All other components within normal limits  GROUP A STREP BY PCR  TSH  TROPONIN I (HIGH SENSITIVITY)   ____________________________________________   ____________________________________________  RADIOLOGY    ____________________________________________   PROCEDURES  Procedure(s) performed: No  Procedures    ____________________________________________   INITIAL IMPRESSION / ASSESSMENT AND PLAN / ED COURSE  Pertinent labs & imaging results that were available during my care of the patient were reviewed by me and considered in my medical decision making (see chart for details).   The patient is a 25 year old female presents with sore throat and burning  sensation through her chest.  See HPI.  Physical exam shows patient appears stable at this time  DDx: Strep throat, covid, esophagitis, reflux secondary pregnancy, thyroid disease  CBC has elevated WBC of 13.6, basic metabolic panel is normal, TSH is normal, strep test negative, troponins normal, respiratory panel is positive for COVID  I did explain the findings to the patient.  Due to the fact that she is [redacted] weeks pregnant and is now COVID-positive instructed her to connect with her OB/GYN doctor.  She can take over-the-counter measures.  Tylenol for fever if needed.  Gargle with warm salt water for the sore throat.  Return emergency department for worsening.  States she understands.  Discharged stable condition.      Carrie [name] was evaluated in Emergency Department on 12/28/2020 for the symptoms described in the history of present illness.  She was evaluated in the context of the global COVID-19 pandemic, which necessitated consideration that the patient might be at risk for infection with the SARS-CoV-2 virus that causes COVID-19. Institutional protocols and algorithms that pertain to the evaluation of patients at risk for COVID-19 are in a state of rapid change based on information released by regulatory bodies including the CDC and federal and state organizations. These policies and algorithms were followed during the patient's care in the ED.    As part of my medical decision making, I reviewed the following data within the electronic MEDICAL RECORD NUMBER Nursing notes reviewed and incorporated, Labs reviewed , EKG interpreted NSR, Old chart reviewed, Notes from prior ED visits, and [place] Controlled Substance Database  ____________________________________________   FINAL CLINICAL IMPRESSION(S) / ED DIAGNOSES  Final diagnoses:  COVID-19      NEW MEDICATIONS STARTED DURING THIS VISIT:  Discharge Medication List as of 12/28/2020 11:38 AM       Note:  This document was prepared using  Dragon voice recognition software and may include unintentional dictation errors.    Faythe Ghee, PA-C 12/28/20 1433    Georga Hacking, MD 12/28/20 [phone]

## 2020-12-28 NOTE — ED Notes (Signed)
See triage note  Presents with sore throat and diff swallowing for couple of days  States the discomfort is moving into chest  Low grade temp on arrival

## 2020-12-28 NOTE — ED Triage Notes (Signed)
Pt here with SOB, CP, and a sore throat for about 3 days. Pt states that she went to another ED recently for the same symptoms and testing was negative for covid. Pt is currently [redacted] weeks pregnant.

## 2020-12-28 NOTE — ED Notes (Signed)
Pt ambulatory to triage desk with c/o burning throat pain, chest discomfort and nausea.  Pt in NAD at present.

## 2021-01-01 ENCOUNTER — Encounter: Payer: Medicaid Other | Admitting: Advanced Practice Midwife

## 2021-01-18 ENCOUNTER — Ambulatory Visit (INDEPENDENT_AMBULATORY_CARE_PROVIDER_SITE_OTHER): Payer: Medicaid Other | Admitting: Advanced Practice Midwife

## 2021-01-18 ENCOUNTER — Other Ambulatory Visit: Payer: Self-pay

## 2021-01-18 ENCOUNTER — Encounter: Payer: Self-pay | Admitting: Advanced Practice Midwife

## 2021-01-18 ENCOUNTER — Other Ambulatory Visit (HOSPITAL_COMMUNITY)
Admission: RE | Admit: 2021-01-18 | Discharge: 2021-01-18 | Disposition: A | Discharge status: Home / Self Care | Payer: Medicaid Other | Source: Ambulatory Visit | Attending: Advanced Practice Midwife | Admitting: Advanced Practice Midwife

## 2021-01-18 VITALS — BP 100/60 | Wt 147.0 lb

## 2021-01-18 DIAGNOSIS — Z3A13 13 weeks gestation of pregnancy: Secondary | ICD-10-CM | POA: Insufficient documentation

## 2021-01-18 DIAGNOSIS — Z113 Encounter for screening for infections with a predominantly sexual mode of transmission: Secondary | ICD-10-CM

## 2021-01-18 DIAGNOSIS — Z1379 Encounter for other screening for genetic and chromosomal anomalies: Secondary | ICD-10-CM

## 2021-01-18 DIAGNOSIS — Z3482 Encounter for supervision of other normal pregnancy, second trimester: Secondary | ICD-10-CM

## 2021-01-18 DIAGNOSIS — Z369 Encounter for antenatal screening, unspecified: Secondary | ICD-10-CM

## 2021-01-18 DIAGNOSIS — Z130 Encounter for screening for diseases of the blood and blood-forming organs and certain disorders involving the immune mechanism: Secondary | ICD-10-CM

## 2021-01-18 LAB — POCT URINE PREGNANCY: Preg Test, Ur: POSITIVE — AB

## 2021-01-18 NOTE — Patient Instructions (Signed)

## 2021-01-20 DIAGNOSIS — Z3490 Encounter for supervision of normal pregnancy, unspecified, unspecified trimester: Secondary | ICD-10-CM | POA: Insufficient documentation

## 2021-01-20 HISTORY — DX: Encounter for supervision of normal pregnancy, unspecified, unspecified trimester: Z34.90

## 2021-01-20 LAB — CERVICOVAGINAL ANCILLARY ONLY
Chlamydia: NEGATIVE
Comment: NEGATIVE
Comment: NEGATIVE
Comment: NORMAL
Neisseria Gonorrhea: NEGATIVE
Trichomonas: NEGATIVE

## 2021-01-20 LAB — URINE CULTURE

## 2021-01-20 NOTE — Progress Notes (Addendum)
New Obstetric Patient H&P  Date of Service: 01/18/2021  Chief Complaint: "Desires prenatal care"   History of Present Illness: Patient is a 25 y.o. G2P1001 Not Hispanic or Latino female, presents with amenorrhea and positive home pregnancy test. Patient's last menstrual period was 10/18/2020. and based on her  LMP, her EDD is Estimated Date of Delivery: 07/25/21 and her EGA is [redacted]w[redacted]d. Cycles are 5 days, regular, and occur approximately every : 28 days. Her last pap smear was 2 years ago and was no abnormalities.    She had a urine pregnancy test which was positive 6 week(s)  ago. Her last menstrual period was normal and lasted for  5 day(s). Since her LMP she claims she has experienced breast tenderness, fatigue, nausea, vomiting (improved). She denies vaginal bleeding. Her past medical history is noncontributory. Her prior pregnancies are notable for  primary c/s breech in June 2021 female 6# 13oz.  Since her LMP, she admits to the use of tobacco products  no She claims she has gained  a few  pounds since the start of her pregnancy.  There are cats in the home in the home  no  She admits close contact with children on a regular basis  yes  She has had chicken pox in the past no She has had Tuberculosis exposures, symptoms, or previously tested positive for TB   no Current or past history of domestic violence. no  Genetic Screening/Teratology Counseling: (Includes patient, baby's father, or anyone in either family with:)   1. Patient's age >/= 60 at [facility]  no 2. Thalassemia ([place], [place], Mediterranean, or Asian background): MCV<80  no 3. Neural tube defect (meningomyelocele, spina bifida, anencephaly)  no 4. Congenital heart defect  no  5. Down syndrome  no 6. Tay-Sachs (Jewish, [place]  no 7. Canavan's Disease  no 8. Sickle cell disease or trait (African)  no  9. Hemophilia or other blood disorders  no  10. Muscular dystrophy  no  11. Cystic fibrosis  no  12. Huntington's  Chorea  no  13. Mental retardation/autism  no 14. Other inherited genetic or chromosomal disorder  no 15. Maternal metabolic disorder (DM, PKU, etc)  no 16. Patient or FOB with a child with a birth defect not listed above no  16a. Patient or FOB with a birth defect themselves no 17. Recurrent pregnancy loss, or stillbirth  no  18. Any medications since LMP other than prenatal vitamins (include vitamins, supplements, OTC meds, drugs, alcohol)  Marijuana most days and she is trying to quit. 19. Any other genetic/environmental exposure to discuss  no  Infection History:   1. Lives with someone with TB or TB exposed  no  2. Patient or partner has history of genital herpes  no 3. Rash or viral illness since LMP  no 4. History of STI (GC, CT, HPV, syphilis, HIV)  CT; tx'ed 5. History of recent travel :  no  Other pertinent information:  no     Review of Systems:10 point review of systems negative unless otherwise noted in HPI  Past Medical History:  Patient Active Problem List   Diagnosis Date Noted   Supervision of normal pregnancy 01/20/2021     Nursing Staff Provider  Office Location  [facility] Dating    Language  English Anatomy US    Flu Vaccine   Genetic Screen  NIPS:   TDaP vaccine    Hgb A1C or  GTT Early : NA Third trimester :  Covid    LAB RESULTS   Rhogam   Blood Type     Feeding Plan Breast Antibody    Contraception  Rubella    Circumcision  RPR     Pediatrician   HBsAg     Support Person Derek HIV    Prenatal Classes  Varicella     GBS  (For PCN allergy, check sensitivities)   BTL Consent     VBAC Consent Primary June 2021 Pap  2020 negative    Hgb Electro    Pelvis Tested  CF      SMA            Perirectal abscess     Past Surgical History:  Past Surgical History:  Procedure Laterality Date   CESAREAN SECTION N/A 11/12/2019   Procedure: CESAREAN SECTION;  Surgeon: Conard Novak, MD;  Location: [facility] ORS;  Service: Obstetrics;  Laterality: N/A;    INCISION AND DRAINAGE PERIRECTAL ABSCESS N/A 01/21/2016   Procedure: IRRIGATION AND DEBRIDEMENT PERIRECTAL ABSCESS;  Surgeon: Leafy Ro, MD;  Location: [facility] ORS;  Service: General;  Laterality: N/A;    Gynecologic History: Patient's last menstrual period was 10/18/2020.  Obstetric History: G2P1001  Family History:  Family History  Problem Relation Age of Onset   Hypertension Mother    Cancer Maternal Aunt    Heart disease Paternal Grandmother     Social History:  Social History   Socioeconomic History   Marital status: Single    Spouse name: Not on file   Number of children: Not on file   Years of education: Not on file   Highest education level: Not on file  Occupational History   Occupation: Unemployed  Tobacco Use   Smoking status: Former    Packs/day: 0.25    Types: Cigarettes    Quit date: 11/26/2020    Years since quitting: 0.1   Smokeless tobacco: Never  Vaping Use   Vaping Use: Never used  Substance and Sexual Activity   Alcohol use: Not Currently    Comment: last use 11/28/20- liquor   Drug use: Yes    Frequency: 14.0 times per week    Types: Marijuana    Comment: daily, planning to stop   Sexual activity: Yes    Birth control/protection: None, Condom, Injection    Comment: undecided   Other Topics Concern   Not on file  Social History Narrative   Not on file   Social Determinants of Health   Financial Resource Strain: Not on file  Food Insecurity: Not on file  Transportation Needs: Not on file  Physical Activity: Not on file  Stress: Not on file  Social Connections: Not on file  Intimate Partner Violence: Not At Risk   Fear of Current or Ex-Partner: No   Emotionally Abused: No   Physically Abused: No   Sexually Abused: No    Allergies:  No Known Allergies  Medications: Prior to Admission medications   Medication Sig Start Date End Date Taking? Authorizing Provider  ondansetron (ZOFRAN ODT) 4 MG disintegrating tablet Take 1 tablet (4 mg  total) by mouth every 8 (eight) hours as needed for nausea or vomiting. 12/15/20  Yes Irean Hong, MD  Prenatal Vit-Fe Fumarate-FA (MULTIVITAMIN-PRENATAL) 27-0.8 MG TABS tablet Take 1 tablet by mouth daily at 12 noon. 12/03/20 03/13/21 Yes Federico Flake, MD  DICLEGIS 10-10 MG TBEC Take 2 tablets by mouth at bedtime. 12/18/20   [provider]    Physical Exam Vitals:  Blood pressure 100/60, weight 147 lb (66.7 kg), last menstrual period 10/18/2020  General: NAD HEENT: normocephalic, anicteric Thyroid: no enlargement, no palpable nodules Pulmonary: No increased work of breathing, CTAB Cardiovascular: RRR, distal pulses 2+ Abdomen: NABS, soft, non-tender, non-distended.  Umbilicus without lesions.  No hepatomegaly, splenomegaly or masses palpable. No evidence of hernia  Genitourinary:  External: Normal external female genitalia.  Normal urethral meatus, normal  Bartholin's and Skene's glands.    Vagina: Normal vaginal mucosa, no evidence of prolapse.    Cervix: Grossly normal in appearance, no bleeding, no CMT  Uterus:  Non-enlarged, mobile, normal contour.    Adnexa: ovaries non-enlarged, no adnexal masses  Rectal: deferred Extremities: no edema, erythema, or tenderness Neurologic: Grossly intact Psychiatric: mood appropriate, affect full   The following were addressed during this visit:  Breastfeeding Education - Early initiation of breastfeeding    Comments: Keeps milk supply adequate, helps contract uterus and slow bleeding, and early milk is the perfect first food and is easy to digest.   - The importance of exclusive breastfeeding    Comments: Provides antibodies, Lower risk of breast and ovarian cancers, and type-2 diabetes,Helps your body recover, Reduced chance of SIDS.   - Risks of giving your baby anything other than breast milk if you are breastfeeding    Comments: Make the baby less content with breastfeeds, may make my baby more susceptible to illness,  and may reduce my milk supply.   - The importance of early skin-to-skin contact    Comments:  Keeps baby warm and secure, helps keep baby's blood sugar up and breathing steady, easier to bond and breastfeed, and helps calm baby.  - Rooming-in on a 24-hour basis    Comments: Easier to learn baby's feeding cues, easier to bond and get to know each other, and encourages milk production.   - Feeding on demand or baby-led feeding    Comments: Helps prevent breastfeeding complications, helps bring in good milk supply, prevents under or overfeeding, and helps baby feel content and satisfied   - Frequent feeding to help assure optimal milk production    Comments: Making a full supply of milk requires frequent removal of milk from breasts, infant will eat 8-12 times in 24 hours, if separated from infant use breast massage, hand expression and/ or pumping to remove milk from breasts.   - Effective positioning and attachment    Comments: Helps my baby to get enough breast milk, helps to produce an adequate milk supply, and helps prevent nipple pain and damage   - Exclusive breastfeeding for the first 6 months    Comments: Builds a healthy milk supply and keeps it up, protects baby from sickness and disease, and breastmilk has everything your baby needs for the first 6 months.   Assessment: 25 y.o. G2P1001 at [redacted]w[redacted]d by LMP presenting to initiate prenatal care  Plan: 1) Avoid alcoholic beverages. 2) Patient encouraged not to smoke.  3) Discontinue the use of all non-medicinal drugs and chemicals.  4) Take prenatal vitamins daily.  5) Nutrition, food safety (fish, cheese advisories, and high nitrite foods) and exercise discussed. 6) Hospital and practice style discussed with cross coverage system.  7) Genetic Screening, such as with 1st Trimester Screening, cell free fetal DNA, AFP testing, and Ultrasound, as well as with amniocentesis and CVS as appropriate, is discussed with patient. At the  conclusion of today's visit patient requested genetic testing 8) Patient is asked about travel to areas at risk for the [place]  virus, and counseled to avoid travel and exposure to mosquitoes or sexual partners who may have themselves been exposed to the virus. Testing is discussed, and will be ordered as appropriate.  9) Aptima, urine culture, NOB panel, sickle cell screen, MaterniT 21 today 10) Return to clinic in 4 weeks for ROB   Tresea MallJane Delvis Kau, CNM [facility] 01/20/2021, 6:25 PM

## 2021-01-22 LAB — MATERNIT21 PLUS CORE+SCA
Fetal Fraction: 10
Monosomy X (Turner Syndrome): NOT DETECTED
Result (T21): NEGATIVE
Trisomy 13 (Patau syndrome): NEGATIVE
Trisomy 18 (Edwards syndrome): NEGATIVE
Trisomy 21 (Down syndrome): NEGATIVE
XXX (Triple X Syndrome): NOT DETECTED
XXY (Klinefelter Syndrome): NOT DETECTED
XYY (Jacobs Syndrome): NOT DETECTED

## 2021-01-22 LAB — RPR+RH+ABO+RUB AB+AB SCR+CB...
Antibody Screen: NEGATIVE
HIV Screen 4th Generation wRfx: NONREACTIVE
Hematocrit: 35.8 % (ref 34.0–46.6)
Hemoglobin: 11.8 g/dL (ref 11.1–15.9)
Hepatitis B Surface Ag: NEGATIVE
MCH: 29.4 pg (ref 26.6–33.0)
MCHC: 33 g/dL (ref 31.5–35.7)
MCV: 89 fL (ref 79–97)
Platelets: 368 10*3/uL (ref 150–450)
RBC: 4.02 x10E6/uL (ref 3.77–5.28)
RDW: 12.6 % (ref 11.7–15.4)
RPR Ser Ql: NONREACTIVE
Rh Factor: POSITIVE
Rubella Antibodies, IGG: 1.01 index (ref >0.99)
Varicella zoster IgG: <135 index — ABNORMAL LOW (ref >165)
WBC: 10.1 10*3/uL (ref 3.4–10.8)

## 2021-01-22 LAB — HGB FRACTIONATION CASCADE
Hgb A2: 2.9 % (ref 1.8–3.2)
Hgb A: 97.1 % (ref 96.4–98.8)
Hgb F: 0 % (ref 0.0–2.0)
Hgb S: 0 %

## 2021-02-15 ENCOUNTER — Encounter: Payer: Self-pay | Admitting: Obstetrics and Gynecology

## 2021-02-15 ENCOUNTER — Ambulatory Visit (INDEPENDENT_AMBULATORY_CARE_PROVIDER_SITE_OTHER): Payer: Medicaid Other | Admitting: Obstetrics and Gynecology

## 2021-02-15 ENCOUNTER — Other Ambulatory Visit: Payer: Self-pay

## 2021-02-15 VITALS — BP 118/74 | Wt 152.0 lb

## 2021-02-15 DIAGNOSIS — O34219 Maternal care for unspecified type scar from previous cesarean delivery: Secondary | ICD-10-CM

## 2021-02-15 DIAGNOSIS — Z3482 Encounter for supervision of other normal pregnancy, second trimester: Secondary | ICD-10-CM

## 2021-02-15 DIAGNOSIS — O219 Vomiting of pregnancy, unspecified: Secondary | ICD-10-CM | POA: Insufficient documentation

## 2021-02-15 DIAGNOSIS — Z3A17 17 weeks gestation of pregnancy: Secondary | ICD-10-CM

## 2021-02-15 MED ORDER — DICLEGIS 10-10 MG PO TBEC: 2.0000 | DELAYED_RELEASE_TABLET | ORAL | 2 refills | Status: DC

## 2021-02-15 NOTE — Progress Notes (Signed)
  Routine Prenatal Care Visit  Subjective  Carrie [name] is a 25 y.o. G2P1001 at [redacted]w[redacted]d being seen today for ongoing prenatal care.  She is currently monitored for the following issues for this low-risk pregnancy and has Perirectal abscess; Supervision of normal pregnancy; History of cesarean delivery affecting pregnancy; and Nausea and vomiting during pregnancy on their problem list.  ----------------------------------------------------------------------------------- Patient reports nausea and vomiting.    . Vag. Bleeding: None.  Movement: Present. Leaking Fluid denies.  ----------------------------------------------------------------------------------- The following portions of the patient's history were reviewed and updated as appropriate: allergies, current medications, past family history, past medical history, past social history, past surgical history and problem list. Problem list updated.  Objective  Blood pressure 118/74, weight 152 lb (68.9 kg), last menstrual period 10/18/2020, not currently breastfeeding. Pregravid weight 133 lb (60.3 kg) Total Weight Gain 19 lb (8.618 kg) Urinalysis: Urine Protein    Urine Glucose    Fetal Status: Fetal Heart Rate (bpm): 145   Movement: Present     General:  Alert, oriented and cooperative. Patient is in no acute distress.  Skin: Skin is warm and dry. No rash noted.   Cardiovascular: Normal heart rate noted  Respiratory: Normal respiratory effort, no problems with respiration noted  Abdomen: Soft, gravid, appropriate for gestational age. Pain/Pressure: Absent     Pelvic:  Cervical exam deferred        Extremities: Normal range of motion.     Mental Status: Normal mood and affect. Normal behavior. Normal judgment and thought content.   Assessment   25 y.o. G2P1001 at [redacted]w[redacted]d by  07/25/2021, by Last Menstrual Period presenting for routine prenatal visit  Plan   pregnancy2  Problems (from 01/18/21 to present)     Problem Noted Resolved    History of cesarean delivery affecting pregnancy 02/15/2021 by Conard Novak, MD No   Nausea and vomiting during pregnancy 02/15/2021 by Conard Novak, MD No   Supervision of normal pregnancy 01/20/2021 by Tresea Mall, CNM No   Overview Addendum 01/20/2021  8:10 PM by Tresea Mall, CNM     Nursing Staff Provider  Office Location  [facility] Dating    Language  English Anatomy US    Flu Vaccine   Genetic Screen  NIPS:   TDaP vaccine    Hgb A1C or  GTT Early : NA Third trimester :   Covid    LAB RESULTS   Rhogam   Blood Type     Feeding Plan Breast Antibody    Contraception  Rubella    Circumcision  RPR     Pediatrician   HBsAg     Support Person Derek HIV    Prenatal Classes  Varicella     GBS  (For PCN allergy, check sensitivities)   BTL Consent     VBAC Consent Primary June 2021 Pap  2020 negative    Hgb Electro    Pelvis Tested  CF      SMA                   Preterm labor symptoms and general obstetric precautions including but not limited to vaginal bleeding, contractions, leaking of fluid and fetal movement were reviewed in detail with the patient. Please refer to After Visit Summary for other counseling recommendations.   Anatomy u/s ordered.   Return in about 4 weeks (around 03/15/2021) for ROB.   Thomasene Mohair, MD, [facility], [facility] 02/15/2021 2:45 PM

## 2021-03-08 ENCOUNTER — Ambulatory Visit
Admission: RE | Admit: 2021-03-08 | Discharge: 2021-03-08 | Disposition: A | Discharge status: Home / Self Care | Payer: Medicaid Other | Source: Ambulatory Visit | Attending: Obstetrics and Gynecology | Admitting: Obstetrics and Gynecology

## 2021-03-08 ENCOUNTER — Other Ambulatory Visit: Payer: Self-pay

## 2021-03-08 DIAGNOSIS — Z3482 Encounter for supervision of other normal pregnancy, second trimester: Secondary | ICD-10-CM | POA: Insufficient documentation

## 2021-03-15 ENCOUNTER — Other Ambulatory Visit: Payer: Self-pay

## 2021-03-15 ENCOUNTER — Ambulatory Visit (INDEPENDENT_AMBULATORY_CARE_PROVIDER_SITE_OTHER): Payer: Medicaid Other | Admitting: Obstetrics and Gynecology

## 2021-03-15 VITALS — BP 100/66 | Wt 158.0 lb

## 2021-03-15 DIAGNOSIS — Z3A21 21 weeks gestation of pregnancy: Secondary | ICD-10-CM

## 2021-03-15 DIAGNOSIS — O34219 Maternal care for unspecified type scar from previous cesarean delivery: Secondary | ICD-10-CM

## 2021-03-15 DIAGNOSIS — Z3482 Encounter for supervision of other normal pregnancy, second trimester: Secondary | ICD-10-CM

## 2021-03-15 LAB — POCT URINALYSIS DIPSTICK OB
Glucose, UA: NEGATIVE
POC,PROTEIN,UA: NEGATIVE

## 2021-03-15 NOTE — Progress Notes (Signed)
ROB - Rt side pain x 2 days. RM 6

## 2021-03-15 NOTE — Progress Notes (Signed)
Routine Prenatal Care Visit  Subjective  Carrie [name] is a 25 y.o. G2P1001 at [redacted]w[redacted]d being seen today for ongoing prenatal care.  She is currently monitored for the following issues for this low-risk pregnancy and has Perirectal abscess; Supervision of normal pregnancy; History of cesarean delivery affecting pregnancy; and Nausea and vomiting during pregnancy on their problem list.  ----------------------------------------------------------------------------------- Patient reports  some intermittent RLQ cramping .   Contractions: Not present. Vag. Bleeding: None.  Movement: Present. Denies leaking of fluid.  ----------------------------------------------------------------------------------- The following portions of the patient's history were reviewed and updated as appropriate: allergies, current medications, past family history, past medical history, past social history, past surgical history and problem list. Problem list updated.   Objective  Blood pressure 100/66, weight 158 lb (71.7 kg), last menstrual period 10/18/2020, not currently breastfeeding. Pregravid weight 133 lb (60.3 kg) Total Weight Gain 25 lb (11.3 kg) Urinalysis:      Fetal Status: Fetal Heart Rate (bpm): 150   Movement: Present     General:  Alert, oriented and cooperative. Patient is in no acute distress.  Skin: Skin is warm and dry. No rash noted.   Cardiovascular: Normal heart rate noted  Respiratory: Normal respiratory effort, no problems with respiration noted  Abdomen: Soft, gravid, appropriate for gestational age. Pain/Pressure: Absent     Pelvic:  Cervical exam deferred        Extremities: Normal range of motion.     ental Status: Normal mood and affect. Normal behavior. Normal judgment and thought content.   US OB Comp + 14 Wk  Result Date: 03/09/2021 CLINICAL DATA:  Second trimester pregnancy for fetal anatomy survey. EXAM: OBSTETRICAL ULTRASOUND >14 WKS FINDINGS: Number of Fetuses: 1 Heart Rate:   140 bpm Movement: Yes Presentation: Cephalic Previa: No Placental Location: Posterior Amniotic Fluid (Subjective): Within normal limits Amniotic Fluid (Objective): Vertical pocket = 5.5cm FETAL BIOMETRY BPD: 5.0cm 21w 0d HC:   18.1cm 20w 3d AC:   15.3cm 20w 4d FL:   3.1cm 19w 5d Current Mean GA: 20w 3d [place] EDC: 07/23/2021 Assigned GA:  20w 1d Assigned EDC: 07/25/2021 FETAL ANATOMY Lateral Ventricles: Appears normal Thalami/CSP: Appears normal Posterior Fossa:  Appears normal Nuchal Region: Appears normal   NFT= N/A > 20 WKS Upper Lip: Appears normal Spine: Appears normal 4 Chamber Heart on Left: Appears normal LVOT: Appears normal RVOT: Appears normal Stomach on Left: Appears normal 3 Vessel Cord: Appears normal Cord Insertion site: Appears normal Kidneys: Appears normal Bladder: Appears normal Extremities: Appears normal Sex: Female Maternal Findings: Cervix:  3.4 cm TA IMPRESSION: Assigned GA currently 20 weeks 1 day.  Appropriate fetal growth. Unremarkable fetal anatomic survey.  No fetal anomalies identified. Electronically Signed   By: Danae Orleans M.D.   On: 03/09/2021 15:25     Assessment   25 y.o. G2P1001 at [redacted]w[redacted]d by  07/25/2021, by Last Menstrual Period presenting for routine prenatal visit  Plan   pregnancy2  Problems (from 01/18/21 to present)     Problem Noted Resolved   History of cesarean delivery affecting pregnancy 02/15/2021 by Conard Novak, MD No   Nausea and vomiting during pregnancy 02/15/2021 by Conard Novak, MD No   Supervision of normal pregnancy 01/20/2021 by Tresea Mall, CNM No   Overview Addendum 01/20/2021  8:10 PM by Tresea Mall, CNM     Nursing Staff Provider  Office Location  [facility] Dating    Language  English Anatomy US    Flu Vaccine  Genetic Screen  NIPS:   TDaP vaccine    Hgb A1C or  GTT Early : NA Third trimester :   Covid    LAB RESULTS   Rhogam   Blood Type     Feeding Plan Breast Antibody    Contraception  Rubella    Circumcision  RPR      Pediatrician   HBsAg     Support Person Derek HIV    Prenatal Classes  Varicella     GBS  (For PCN allergy, check sensitivities)   BTL Consent     VBAC Consent Primary June 2021 Pap  2020 negative    Hgb Electro    Pelvis Tested  CF      SMA                   Gestational age appropriate obstetric precautions including but not limited to vaginal bleeding, contractions, leaking of fluid and fetal movement were reviewed in detail with the patient.    Return in about 4 weeks (around 04/12/2021) for ROB 4 weeks and 8 weeks ROB with 28 week labs.  Vena Austria, MD, [facility], [facility] 03/15/2021, 3:41 PM

## 2021-04-12 ENCOUNTER — Encounter: Payer: Medicaid Other | Admitting: Obstetrics

## 2021-04-12 ENCOUNTER — Ambulatory Visit (INDEPENDENT_AMBULATORY_CARE_PROVIDER_SITE_OTHER): Payer: Medicaid Other | Admitting: Obstetrics

## 2021-04-12 ENCOUNTER — Other Ambulatory Visit: Payer: Self-pay

## 2021-04-12 VITALS — BP 118/70 | Wt 167.0 lb

## 2021-04-12 DIAGNOSIS — Z3A25 25 weeks gestation of pregnancy: Secondary | ICD-10-CM

## 2021-04-12 DIAGNOSIS — Z3482 Encounter for supervision of other normal pregnancy, second trimester: Secondary | ICD-10-CM

## 2021-04-12 DIAGNOSIS — F1290 Cannabis use, unspecified, uncomplicated: Secondary | ICD-10-CM | POA: Insufficient documentation

## 2021-04-12 NOTE — Progress Notes (Signed)
Routine Prenatal Care Visit  Subjective  Carrie [name] is a 25 y.o. G2P1001 at [redacted]w[redacted]d being seen today for ongoing prenatal care.  She is currently monitored for the following issues for this high-risk pregnancy and has Perirectal abscess; Supervision of normal pregnancy; History of cesarean delivery affecting pregnancy; and Nausea and vomiting during pregnancy on their problem list.  ----------------------------------------------------------------------------------- Patient reports no complaints.  Strong smell of MJ in exam room. Patient admits to feeling slightly sad every day- PHQ 9 and GAD is 10 score. Addressed her "self medicating" with MJ, and offered her option of addressing mood issues legally. Contractions: Not present. Vag. Bleeding: None.  Movement: Present. Leaking Fluid denies.  ----------------------------------------------------------------------------------- The following portions of the patient's history were reviewed and updated as appropriate: allergies, current medications, past family history, past medical history, past social history, past surgical history and problem list. Problem list updated.  Objective  Blood pressure 118/70, weight 167 lb (75.8 kg), last menstrual period 10/18/2020, not currently breastfeeding. Pregravid weight 133 lb (60.3 kg) Total Weight Gain 34 lb (15.4 kg) Urinalysis: Urine Protein    Urine Glucose    Fetal Status:     Movement: Present     General:  Alert, oriented and cooperative. Patient is in no acute distress.  Skin: Skin is warm and dry. No rash noted.   Cardiovascular: Normal heart rate noted  Respiratory: Normal respiratory effort, no problems with respiration noted  Abdomen: Soft, gravid, appropriate for gestational age. Pain/Pressure: Absent     Pelvic:  Cervical exam deferred        Extremities: Normal range of motion.  Edema: None  Mental Status: Normal mood and affect. Normal behavior. Normal judgment and thought content.    Assessment   25 y.o. G2P1001 at [redacted]w[redacted]d by  07/25/2021, by Last Menstrual Period presenting for routine prenatal visit  Plan   pregnancy2  Problems (from 01/18/21 to present)    Problem Noted Resolved   History of cesarean delivery affecting pregnancy 02/15/2021 by Conard Novak, MD No   Nausea and vomiting during pregnancy 02/15/2021 by Conard Novak, MD No   Supervision of normal pregnancy 01/20/2021 by Tresea Mall, CNM No   Overview Addendum 04/12/2021  1:44 PM by Ellwood Sayers, CNM     Nursing Staff Provider  Office Location  [facility] Dating   CW LMP  Language  English Anatomy US  Normal female  Flu Vaccine   Genetic Screen  NIPS:   TDaP vaccine    Hgb A1C or  GTT Early : NA Third trimester :   Covid    LAB RESULTS   Rhogam   Blood Type A/Positive/-- (08/22 1628)   Feeding Plan Breast Antibody Negative (08/22 1628)  Contraception  Rubella 1.01 (08/22 1628)  Circumcision  RPR Non Reactive (08/22 1628)   Pediatrician   HBsAg Negative (08/22 1628)   Support Person Francee Piccolo HIV Non Reactive (08/22 1628)  Prenatal Classes  Varicella     GBS  (For PCN allergy, check sensitivities)   BTL Consent     VBAC Consent Primary June 2021 Pap  2020 negative    Hgb Electro    Pelvis Tested  CF      SMA                   Preterm labor symptoms and general obstetric precautions including but not limited to vaginal bleeding, contractions, leaking of fluid and fetal movement were reviewed in detail with the patient. Please  refer to After Visit Summary for other counseling recommendations.  Will have her 28 week labs next visit. Plan on UDS at that visit.  No follow-ups on file.  Mirna Mires, CNM  04/12/2021 1:52 PM

## 2021-04-12 NOTE — Progress Notes (Signed)
No vb. No lof.  

## 2021-05-10 ENCOUNTER — Other Ambulatory Visit: Payer: Self-pay

## 2021-05-10 ENCOUNTER — Other Ambulatory Visit: Payer: Medicaid Other

## 2021-05-10 ENCOUNTER — Ambulatory Visit (INDEPENDENT_AMBULATORY_CARE_PROVIDER_SITE_OTHER): Payer: Medicaid Other | Admitting: Obstetrics

## 2021-05-10 VITALS — BP 110/62 | Wt 172.0 lb

## 2021-05-10 DIAGNOSIS — R7309 Other abnormal glucose: Secondary | ICD-10-CM

## 2021-05-10 DIAGNOSIS — Z3482 Encounter for supervision of other normal pregnancy, second trimester: Secondary | ICD-10-CM

## 2021-05-10 DIAGNOSIS — F1290 Cannabis use, unspecified, uncomplicated: Secondary | ICD-10-CM

## 2021-05-10 DIAGNOSIS — Z3A29 29 weeks gestation of pregnancy: Secondary | ICD-10-CM

## 2021-05-10 DIAGNOSIS — Z3483 Encounter for supervision of other normal pregnancy, third trimester: Secondary | ICD-10-CM

## 2021-05-10 LAB — POCT URINALYSIS DIPSTICK OB
Glucose, UA: NEGATIVE
POC,PROTEIN,UA: NEGATIVE

## 2021-05-10 NOTE — Progress Notes (Signed)
Routine Prenatal Care Visit  Subjective  Carrie [name] is a 25 y.o. G2P1001 at [redacted]w[redacted]d being seen today for ongoing prenatal care.  She is currently monitored for the following issues for this high-risk pregnancy and has Perirectal abscess; Supervision of normal pregnancy; History of cesarean delivery affecting pregnancy; Nausea and vomiting during pregnancy; and Marijuana smoker on their problem list.  ----------------------------------------------------------------------------------- Patient reports no complaints.  She continues to use diclegis foir N/V. Also admits to MJ use Contractions: Not present. Vag. Bleeding: None.  Movement: Present. Leaking Fluid denies.  ----------------------------------------------------------------------------------- The following portions of the patient's history were reviewed and updated as appropriate: allergies, current medications, past family history, past medical history, past social history, past surgical history and problem list. Problem list updated.  Objective  Blood pressure 110/62, weight 172 lb (78 kg), last menstrual period 10/18/2020, not currently breastfeeding. Pregravid weight 133 lb (60.3 kg) Total Weight Gain 39 lb (17.7 kg) Urinalysis: Urine Protein Negative  Urine Glucose Negative  Fetal Status:     Movement: Present     General:  Alert, oriented and cooperative. Patient is in no acute distress.  Skin: Skin is warm and dry. No rash noted.   Cardiovascular: Normal heart rate noted  Respiratory: Normal respiratory effort, no problems with respiration noted  Abdomen: Soft, gravid, appropriate for gestational age. Pain/Pressure: Absent     Pelvic:  Cervical exam deferred        Extremities: Normal range of motion.     Mental Status: Normal mood and affect. Normal behavior. Normal judgment and thought content.   Assessment   25 y.o. G2P1001 at [redacted]w[redacted]d by  07/25/2021, by Last Menstrual Period presenting for routine prenatal visit  Plan    pregnancy2  Problems (from 01/18/21 to present)    Problem Noted Resolved   Marijuana smoker 04/12/2021 by Imagene Riches, CNM No   Overview Addendum 05/10/2021 10:13 AM by Imagene Riches, CNM    Admits to MJ use this pregnancy. Counseled UDS ordered at 28 week lab draw      History of cesarean delivery affecting pregnancy 02/15/2021 by Will Bonnet, MD No   Nausea and vomiting during pregnancy 02/15/2021 by Will Bonnet, MD No   Supervision of normal pregnancy 01/20/2021 by Rod Can, CNM No   Overview Addendum 04/12/2021  1:44 PM by Allen Derry, CNM     Nursing Staff Provider  Office Location  [facility] Dating   CW LMP  Language  English Anatomy US  Normal female  Flu Vaccine   Genetic Screen  NIPS:   TDaP vaccine    Hgb A1C or  GTT Early : NA Third trimester :   Covid    LAB RESULTS   Rhogam   Blood Type A/Positive/-- (08/22 1628)   Feeding Plan Breast Antibody Negative (08/22 1628)  Contraception  Rubella 1.01 (08/22 1628)  Circumcision  RPR Non Reactive (08/22 1628)   Pediatrician   HBsAg Negative (08/22 1628)   Support Person Vicente Males HIV Non Reactive (08/22 1628)  Prenatal Classes  Varicella     GBS  (For PCN allergy, check sensitivities)   BTL Consent     VBAC Consent Primary June 2021 Pap  2020 negative    Hgb Electro    Pelvis Tested  CF      SMA                   Preterm labor symptoms and general obstetric precautions including but not limited  to vaginal bleeding, contractions, leaking of fluid and fetal movement were reviewed in detail with the patient. Please refer to After Visit Summary for other counseling recommendations.  Discussed adding daily Pepcid to her regime for N/V. Again advised to refrain from MJ use. 28 week labs today and UDS.  Return in about 2 weeks (around 05/24/2021) for return OB.  Mirna Mires, CNM  05/10/2021 10:14 AM

## 2021-05-11 ENCOUNTER — Encounter: Payer: Self-pay | Admitting: Obstetrics

## 2021-05-11 ENCOUNTER — Other Ambulatory Visit: Payer: Self-pay

## 2021-05-11 ENCOUNTER — Telehealth: Payer: Self-pay

## 2021-05-11 ENCOUNTER — Emergency Department
Admission: EM | Admit: 2021-05-11 | Discharge: 2021-05-11 | Disposition: A | Discharge status: Home / Self Care | Payer: Medicaid Other | Attending: Emergency Medicine | Admitting: Emergency Medicine

## 2021-05-11 DIAGNOSIS — O219 Vomiting of pregnancy, unspecified: Secondary | ICD-10-CM | POA: Diagnosis not present

## 2021-05-11 DIAGNOSIS — R7309 Other abnormal glucose: Secondary | ICD-10-CM

## 2021-05-11 DIAGNOSIS — Z5321 Procedure and treatment not carried out due to patient leaving prior to being seen by health care provider: Secondary | ICD-10-CM | POA: Insufficient documentation

## 2021-05-11 DIAGNOSIS — Z3A29 29 weeks gestation of pregnancy: Secondary | ICD-10-CM | POA: Diagnosis not present

## 2021-05-11 HISTORY — DX: Other abnormal glucose: R73.09

## 2021-05-11 LAB — COMPREHENSIVE METABOLIC PANEL
ALT: 18 U/L (ref 0–44)
AST: 19 U/L (ref 15–41)
Albumin: 3 g/dL — ABNORMAL LOW (ref 3.5–5.0)
Alkaline Phosphatase: 125 U/L (ref 38–126)
Anion gap: 5 (ref 5–15)
BUN: <5 mg/dL — ABNORMAL LOW (ref 6–20)
CO2: 23 mmol/L (ref 22–32)
Calcium: 8.4 mg/dL — ABNORMAL LOW (ref 8.9–10.3)
Chloride: 106 mmol/L (ref 98–111)
Creatinine, Ser: 0.43 mg/dL — ABNORMAL LOW (ref 0.44–1.00)
GFR, Estimated: >60 mL/min (ref >60)
Glucose, Bld: 71 mg/dL (ref 70–99)
Potassium: 3.5 mmol/L (ref 3.5–5.1)
Sodium: 134 mmol/L — ABNORMAL LOW (ref 135–145)
Total Bilirubin: 0.5 mg/dL (ref 0.3–1.2)
Total Protein: 6.5 g/dL (ref 6.5–8.1)

## 2021-05-11 LAB — 28 WEEK RH+PANEL
Basophils Absolute: 0 10*3/uL (ref 0.0–0.2)
Basos: 0 %
EOS (ABSOLUTE): 0.1 10*3/uL (ref 0.0–0.4)
Eos: 1 %
Gestational Diabetes Screen: 186 mg/dL — ABNORMAL HIGH (ref 70–139)
HIV Screen 4th Generation wRfx: NONREACTIVE
Hematocrit: 34.3 % (ref 34.0–46.6)
Hemoglobin: 11.5 g/dL (ref 11.1–15.9)
Immature Grans (Abs): 0 10*3/uL (ref 0.0–0.1)
Immature Granulocytes: 0 %
Lymphocytes Absolute: 2 10*3/uL (ref 0.7–3.1)
Lymphs: 24 %
MCH: 29.8 pg (ref 26.6–33.0)
MCHC: 33.5 g/dL (ref 31.5–35.7)
MCV: 89 fL (ref 79–97)
Monocytes Absolute: 0.6 10*3/uL (ref 0.1–0.9)
Monocytes: 7 %
Neutrophils Absolute: 5.8 10*3/uL (ref 1.4–7.0)
Neutrophils: 68 %
Platelets: 299 10*3/uL (ref 150–450)
RBC: 3.86 x10E6/uL (ref 3.77–5.28)
RDW: 11.8 % (ref 11.7–15.4)
RPR Ser Ql: NONREACTIVE
WBC: 8.5 10*3/uL (ref 3.4–10.8)

## 2021-05-11 LAB — CBC
HCT: 32.9 % — ABNORMAL LOW (ref 36.0–46.0)
Hemoglobin: 11.1 g/dL — ABNORMAL LOW (ref 12.0–15.0)
MCH: 30.7 pg (ref 26.0–34.0)
MCHC: 33.7 g/dL (ref 30.0–36.0)
MCV: 91.1 fL (ref 80.0–100.0)
Platelets: 278 10*3/uL (ref 150–400)
RBC: 3.61 MIL/uL — ABNORMAL LOW (ref 3.87–5.11)
RDW: 13.1 % (ref 11.5–15.5)
WBC: 11.4 10*3/uL — ABNORMAL HIGH (ref 4.0–10.5)
nRBC: 0 % (ref 0.0–0.2)

## 2021-05-11 LAB — LIPASE, BLOOD: Lipase: 24 U/L (ref 11–51)

## 2021-05-11 LAB — HCG, QUANTITATIVE, PREGNANCY: hCG, Beta Chain, Quant, S: 6528 m[IU]/mL — ABNORMAL HIGH (ref <5)

## 2021-05-11 NOTE — ED Triage Notes (Signed)
Pt to ED for emesis x3. Last episode at 0530 this am. [redacted] weeks pregnant. Reports emesis was brown color and was sent here.  Pt states does not think she needs to be here and unsure why doctor sent.  NAD noted, ambulatory, denies pain Denies vaginal bleeding

## 2021-05-11 NOTE — Telephone Encounter (Signed)
Could you please advise no work in's today? Does patient needs to follow up with L&D?

## 2021-05-11 NOTE — Telephone Encounter (Signed)
Carrie [name] asked me.  L&D triage.

## 2021-05-11 NOTE — Telephone Encounter (Signed)
Can you please contact this patient please

## 2021-05-11 NOTE — Telephone Encounter (Signed)
Pt calling; is 29wks; has been nauseas the whole time; this am had brownish blood in emesis; baby is not as active as normal.  [phone]  Pt aware per PH to go to L&D; Tresa Endo notified.

## 2021-05-25 ENCOUNTER — Encounter: Payer: Medicaid Other | Admitting: Obstetrics

## 2021-05-27 ENCOUNTER — Ambulatory Visit (INDEPENDENT_AMBULATORY_CARE_PROVIDER_SITE_OTHER): Payer: Medicaid Other | Admitting: Licensed Practical Nurse

## 2021-05-27 ENCOUNTER — Encounter: Payer: Self-pay | Admitting: Licensed Practical Nurse

## 2021-05-27 ENCOUNTER — Other Ambulatory Visit: Payer: Self-pay

## 2021-05-27 VITALS — BP 118/74 | Wt 172.0 lb

## 2021-05-27 DIAGNOSIS — Z3A31 31 weeks gestation of pregnancy: Secondary | ICD-10-CM

## 2021-05-27 DIAGNOSIS — Z3480 Encounter for supervision of other normal pregnancy, unspecified trimester: Secondary | ICD-10-CM

## 2021-05-27 NOTE — Progress Notes (Signed)
Routine Prenatal Care Visit  Subjective  Carrie [name] is a 25 y.o. G2P1001 at [redacted]w[redacted]d being seen today for ongoing prenatal care.  She is currently monitored for the following issues for this low-risk pregnancy and has Perirectal abscess; Supervision of normal pregnancy; History of cesarean delivery affecting pregnancy; Nausea and vomiting during pregnancy; Marijuana smoker; and Elevated glucose tolerance test on their problem list.  ----------------------------------------------------------------------------------- Patient reports heartburn Doing well.  Feels mood has been great.  Reviewed weight gain-she gained 43lbs in last pregnancy, rec increasing physical activity. Reviewed elevated 1 hour and the importance of doing the 3 hour test, verbalizes understanding and will return on Monday for labs.  Reports she likes to eat a lot of sweet, encouraged moderation and may need to avoid sweets based on the three hour results.   Contractions: Not present. Vag. Bleeding: None.   . Leaking Fluid denies.  ----------------------------------------------------------------------------------- The following portions of the patient's history were reviewed and updated as appropriate: allergies, current medications, past family history, past medical history, past social history, past surgical history and problem list. Problem list updated.  Objective  Blood pressure 118/74, weight 172 lb (78 kg), last menstrual period 10/18/2020, not currently breastfeeding. Pregravid weight 133 lb (60.3 kg) Total Weight Gain 39 lb (17.7 kg) Urinalysis: Urine Protein    Urine Glucose    Fetal Status: Fetal Heart Rate (bpm): 140 Fundal Height: 32 cm Movement: Present     General:  Alert, oriented and cooperative. Patient is in no acute distress.  Skin: Skin is warm and dry. No rash noted.   Cardiovascular: Normal heart rate noted  Respiratory: Normal respiratory effort, no problems with respiration noted  Abdomen: Soft,  gravid, appropriate for gestational age. Pain/Pressure: Absent     Pelvic:  Cervical exam deferred        Extremities: Normal range of motion.     Mental Status: Normal mood and affect. Normal behavior. Normal judgment and thought content.   Assessment   25 y.o. G2P1001 at 101w4d by  07/25/2021, by Last Menstrual Period presenting for routine prenatal visit  Plan   pregnancy2  Problems (from 01/18/21 to present)     Problem Noted Resolved   Elevated glucose tolerance test 05/11/2021 by Mirna Mires, CNM No   Overview Signed 05/11/2021  6:14 PM by Mirna Mires, CNM    12/13 notified of need for 3 hr testing      Marijuana smoker 04/12/2021 by Mirna Mires, CNM No   Overview Addendum 05/10/2021 10:13 AM by Mirna Mires, CNM    Admits to MJ use this pregnancy. Counseled UDS ordered at 28 week lab draw      History of cesarean delivery affecting pregnancy 02/15/2021 by Conard Novak, MD No   Nausea and vomiting during pregnancy 02/15/2021 by Conard Novak, MD No   Supervision of normal pregnancy 01/20/2021 by Tresea Mall, CNM No   Overview Addendum 05/27/2021  9:09 AM by Ellwood Sayers, CNM     Nursing Staff Provider  Office Location  [facility] Dating   CW LMP  Language  English Anatomy US  Normal female  Flu Vaccine   Genetic Screen  NIPS:   TDaP vaccine    Hgb A1C or  GTT Early : NA Third trimester : 12/12 drawn  Covid    LAB RESULTS   Rhogam  NA Blood Type A/Positive/-- (08/22 1628)   Feeding Plan Breast Antibody Negative (08/22 1628)  Contraception Nexplanon  Rubella 1.01 (  08/22 1628)  Circumcision yes RPR Non Reactive (08/22 1628)   Pediatrician  [place]  HBsAg Negative (08/22 1628)   Support Person Francee Piccolo HIV Non Reactive (08/22 1628)  Prenatal Classes multip Varicella Non-immune    GBS  (For PCN allergy, check sensitivities)   BTL Consent     VBAC Consent Primary June 2021 Pap  2020 negative    Hgb Electro    Pelvis Tested  CF       SMA                    Preterm labor symptoms and general obstetric precautions including but not limited to vaginal bleeding, contractions, leaking of fluid and fetal movement were reviewed in detail with the patient. Please refer to After Visit Summary for other counseling recommendations.   Return in about 4 days (around 05/31/2021) for 3 hour test, then every 2 weeks .  [name], CNM  Domingo Pulse, [facility]  05/27/21  9:13 AM

## 2021-05-27 NOTE — Progress Notes (Signed)
No vb. No lof. Declines tdap 

## 2021-05-30 NOTE — L&D Delivery Note (Signed)
Delivery Summary for [name]  Labor Events:   Preterm labor: No data found  Rupture date: No data found  Rupture time: No data found  Rupture type: Bulging bag of water  Fluid Color: No data found  Induction: No data found  Augmentation: No data found  Complications: No data found  Cervical ripening: No data found No data found   No data found     Delivery:   Episiotomy: No data found  Lacerations: No data found  Repair suture: No data found  Repair # of packets: No data found  Blood loss (ml): No data found   Information for the patient's newbornDelona, Carrie [name] [id number]   Delivery 07/19/2021 11:18 PM by  C-Section, Low Transverse Sex:  female Gestational Age: [redacted]w[redacted]d Delivery Clinician:   Living?:         APGARS  One minute Five minutes Ten minutes  Skin color:        Heart rate:        Grimace:        Muscle tone:        Breathing:        Totals: 8  9      Presentation/position:      Resuscitation:   Cord information:    Disposition of cord blood:     Blood gases sent?  Complications:   Placenta: Delivered:       appearance Newborn Measurements: Weight: 6 lb 7 oz (2920 g)  Height:    Head circumference:    Chest circumference:    Other providers:    Additional  information: Forceps:   Vacuum:   Breech:   Observed anomalies      See Dr., Naomii Kreger's operative note for details of C-section.    Rubie Maid, MD [facility]

## 2021-06-02 ENCOUNTER — Other Ambulatory Visit: Payer: Medicaid Other

## 2021-06-02 ENCOUNTER — Other Ambulatory Visit: Payer: Self-pay

## 2021-06-02 DIAGNOSIS — R7309 Other abnormal glucose: Secondary | ICD-10-CM

## 2021-06-03 LAB — GESTATIONAL GLUCOSE TOLERANCE
Glucose, Fasting: 85 mg/dL (ref 70–94)
Glucose, GTT - 1 Hour: 176 mg/dL (ref 70–179)
Glucose, GTT - 2 Hour: 140 mg/dL (ref 70–154)
Glucose, GTT - 3 Hour: 95 mg/dL (ref 70–139)

## 2021-06-07 ENCOUNTER — Ambulatory Visit (INDEPENDENT_AMBULATORY_CARE_PROVIDER_SITE_OTHER): Payer: Medicaid Other | Admitting: Obstetrics and Gynecology

## 2021-06-07 ENCOUNTER — Other Ambulatory Visit: Payer: Self-pay

## 2021-06-07 ENCOUNTER — Encounter: Payer: Self-pay | Admitting: Obstetrics and Gynecology

## 2021-06-07 VITALS — BP 112/70 | Ht 62.0 in | Wt 171.6 lb

## 2021-06-07 DIAGNOSIS — Z3483 Encounter for supervision of other normal pregnancy, third trimester: Secondary | ICD-10-CM

## 2021-06-07 DIAGNOSIS — Z23 Encounter for immunization: Secondary | ICD-10-CM | POA: Diagnosis not present

## 2021-06-07 DIAGNOSIS — Z3A33 33 weeks gestation of pregnancy: Secondary | ICD-10-CM

## 2021-06-07 NOTE — Progress Notes (Signed)
Routine Prenatal Care Visit  Subjective  Carrie [name] is a 26 y.o. G2P1001 at [redacted]w[redacted]d being seen today for ongoing prenatal care.  She is currently monitored for the following issues for this low-risk pregnancy and has Perirectal abscess; Supervision of normal pregnancy; History of cesarean delivery affecting pregnancy; Nausea and vomiting during pregnancy; Marijuana smoker; and Elevated glucose tolerance test on their problem list.  ----------------------------------------------------------------------------------- Patient reports pelvic pressure with movements of baby. Not having tightening of abdomen or Braxton-Hicks like contractions.  Contractions: Irregular. Vag. Bleeding: None.  Movement: Present. Denies leaking of fluid.  ----------------------------------------------------------------------------------- The following portions of the patient's history were reviewed and updated as appropriate: allergies, current medications, past family history, past medical history, past social history, past surgical history and problem list. Problem list updated.   Objective  Blood pressure 112/70, height 5\' 2"  (1.575 m), weight 171 lb 9.6 oz (77.8 kg), last menstrual period 10/18/2020, not currently breastfeeding. Pregravid weight 133 lb (60.3 kg) Total Weight Gain 38 lb 9.6 oz (17.5 kg) Urinalysis:      Fetal Status: Fetal Heart Rate (bpm): 140 Fundal Height: 30 cm Movement: Present     General:  Alert, oriented and cooperative. Patient is in no acute distress.  Skin: Skin is warm and dry. No rash noted.   Cardiovascular: Normal heart rate noted  Respiratory: Normal respiratory effort, no problems with respiration noted  Abdomen: Soft, gravid, appropriate for gestational age. Pain/Pressure: Present     Pelvic:  Cervical exam deferred        Extremities: Normal range of motion.  Edema: None  Mental Status: Normal mood and affect. Normal behavior. Normal judgment and thought content.      Assessment   26 y.o. G2P1001 at [redacted]w[redacted]d by  07/25/2021, by Last Menstrual Period presenting for routine prenatal visit  Plan   pregnancy2  Problems (from 01/18/21 to present)     Problem Noted Resolved   Elevated glucose tolerance test 05/11/2021 by Mirna Mires, CNM No   Overview Addendum 06/04/2021  5:49 PM by Mirna Mires, CNM    12/13 notified of need for 3 hr testing Passed 3 hour testing.      Marijuana smoker 04/12/2021 by Mirna Mires, CNM No   Overview Addendum 05/10/2021 10:13 AM by Mirna Mires, CNM    Admits to MJ use this pregnancy. Counseled UDS ordered at 28 week lab draw      History of cesarean delivery affecting pregnancy 02/15/2021 by Conard Novak, MD No   Nausea and vomiting during pregnancy 02/15/2021 by Conard Novak, MD No   Supervision of normal pregnancy 01/20/2021 by Tresea Mall, CNM No   Overview Addendum 06/07/2021  9:24 AM by Natale Milch, MD     Nursing Staff Provider  Office Location  [facility] Dating   CW LMP  Language  English Anatomy US  Normal female  Flu Vaccine   Genetic Screen  NIPS: normal xy  TDaP vaccine   06/07/2020 Hgb A1C or  GTT Early : NA Third trimester : elevated but passed 3 hr test  Covid    LAB RESULTS   Rhogam  NA Blood Type A/Positive/-- (08/22 1628)   Feeding Plan Breast Antibody Negative (08/22 1628)  Contraception Nexplanon  Rubella 1.01 (08/22 1628)  Circumcision yes RPR Non Reactive (08/22 1628)   Pediatrician  [place]  HBsAg Negative (08/22 1628)   Support Person Francee Piccolo HIV Non Reactive (08/22 1628)  Prenatal Classes multip Varicella Non-immune  GBS  (For PCN allergy, check sensitivities)   BTL Consent     VBAC Consent Primary June 2021 Pap  2020 negative    Hgb Electro    Pelvis Tested  CF      SMA                   [employer] [place] ordered TDAP today  Gestational age appropriate obstetric precautions including but not limited to vaginal bleeding, contractions,  leaking of fluid and fetal movement were reviewed in detail with the patient.    Return in about 2 weeks (around 06/21/2021) for ROB in person.  Homero Fellers MD [facility] 06/07/2021, 9:25 AM

## 2021-06-07 NOTE — Progress Notes (Signed)
p 

## 2021-06-21 ENCOUNTER — Encounter: Payer: Medicaid Other | Admitting: Obstetrics & Gynecology

## 2021-06-22 ENCOUNTER — Ambulatory Visit
Admission: RE | Admit: 2021-06-22 | Discharge: 2021-06-22 | Disposition: A | Discharge status: Home / Self Care | Payer: Medicaid Other | Source: Ambulatory Visit | Attending: Obstetrics and Gynecology | Admitting: Obstetrics and Gynecology

## 2021-06-22 ENCOUNTER — Other Ambulatory Visit: Payer: Self-pay

## 2021-06-22 DIAGNOSIS — O365930 Maternal care for other known or suspected poor fetal growth, third trimester, not applicable or unspecified: Secondary | ICD-10-CM | POA: Diagnosis not present

## 2021-06-22 DIAGNOSIS — Z3A35 35 weeks gestation of pregnancy: Secondary | ICD-10-CM | POA: Diagnosis not present

## 2021-06-22 DIAGNOSIS — Z3483 Encounter for supervision of other normal pregnancy, third trimester: Secondary | ICD-10-CM

## 2021-06-23 ENCOUNTER — Ambulatory Visit (INDEPENDENT_AMBULATORY_CARE_PROVIDER_SITE_OTHER): Payer: Medicaid Other | Admitting: Advanced Practice Midwife

## 2021-06-23 ENCOUNTER — Encounter: Payer: Self-pay | Admitting: Advanced Practice Midwife

## 2021-06-23 ENCOUNTER — Other Ambulatory Visit: Payer: Self-pay

## 2021-06-23 VITALS — BP 120/80 | Wt 173.0 lb

## 2021-06-23 DIAGNOSIS — Z3A35 35 weeks gestation of pregnancy: Secondary | ICD-10-CM

## 2021-06-23 DIAGNOSIS — Z3483 Encounter for supervision of other normal pregnancy, third trimester: Secondary | ICD-10-CM

## 2021-06-23 LAB — POCT URINALYSIS DIPSTICK OB: Glucose, UA: NEGATIVE

## 2021-06-23 NOTE — Addendum Note (Signed)
Addended by: Quintella Baton D on: 06/23/2021 08:47 AM   Modules accepted: Orders

## 2021-06-23 NOTE — Patient Instructions (Signed)
Vaginal Birth After Cesarean Delivery °Vaginal birth after cesarean delivery (VBAC) means giving birth vaginally after previously delivering a baby through a cesarean section, or C-section. VBAC may be a safe option for you, depending on your health and other factors. °Discuss VBAC with your health care provider early in your pregnancy, so you can understand the risks, benefits, and options. Having these discussions early will give you time to make your birth plan. °What increases the chances for a successful VBAC? °These factors increase your chances of a successful VBAC: °You have had only one previous C-section. °You had a low transverse incision for your C-section. °You have had a successful vaginal birth. °Your labor starts naturally on or before your due date. °You and your baby have had a healthy pregnancy. °Your baby is head-down. °What happens when I arrive at the birth center or hospital? °Once you are in labor and have been admitted into the hospital or birth center, your health care team may: °Review your pregnancy history and go over any concerns you have. °Talk with you about your birth plan and discuss pain control options. °Check your blood pressure, breathing rate, and heart rate. °Check your baby's heartbeat. °Monitor your uterus for contractions. °Check whether your bag of water (amniotic sac) has broken (ruptured). °Insert an IV into one of your veins. You may get fluids and medicine through the IV. °Monitoring and exams °Your health care team may check your contractions (uterine monitoring) and your baby's heart rate (fetal monitoring). You may need to be monitored for long periods at a time (continuously) with a monitoring device. °Your health care provider may also do frequent physical exams. These may include checking how and where your baby is positioned in your uterus and checking your cervix to determine whether it is opening up (dilating) or thinning out (effacing). °What happens during  labor and delivery? °Normal labor and delivery are divided into the following three stages: °Stage 1 °This is the longest stage of labor. °Throughout this stage, you will feel contractions. Contractions are generally mild, infrequent, and irregular at first. They get stronger, more frequent (about every 2-3 minutes), and more regular as you move through this stage. °The first stage ends when your cervix is completely dilated to 4 inches (10 cm) and effaced. °Stage 2 °This stage starts once your cervix is completely dilated and effaced and lasts until you deliver your baby. °In this stage, you will feel the urge to push your baby out of your vagina. °You may feel stretching and burning pain, especially when the widest part of your baby's head passes through the vaginal opening (crowning). °Once your baby is delivered, the umbilical cord will be clamped and cut. °Your baby will be placed on your bare chest and covered with a warm blanket. If you are planning to breastfeed, watch your baby for feeding cues, like rooting or sucking. °Stage 3 °This stage starts immediately after your baby is born and ends after you deliver the placenta. °This stage may take anywhere from 5 to 30 minutes. °What can I expect after labor and delivery? °After labor is over, you and your baby will be checked to make sure you are both healthy, and you will both be monitored until you are ready to go home. °You may be encouraged to stay in the same room with your baby to promote early bonding and successful breastfeeding. °Your uterus will be checked and massaged regularly (fundal massage). °You may continue to receive fluids and medicines through   an IV. °You will have some soreness and pain in your abdomen, vagina, and the area of skin between your vaginal opening and your anus (perineum). °If an incision was made near your vagina (episiotomy) or if you had some vaginal tearing during delivery, cold compresses may be used to reduce pain and  swelling. °Follow these instructions at home: °Incision care °If you have an episiotomy incision, follow instructions from your health care provider about how to take care of your incision. °Check your incision area every day for signs of infection. Check for: °Redness, swelling, or pain. °More fluid or blood. °Warmth. °Pus or a bad smell. °If directed, put ice on the episiotomy area. To do this: °Put ice in a plastic bag. °Place a towel between your skin and the bag. °Leave the ice on for 20 minutes, 2-3 times a day. °Remove the ice if your skin turns bright red. This is very important. If you cannot feel pain, heat, or cold, you have a greater risk of damage to the area. °Activity °Return to your normal activities as told by your health care provider. Ask your health care provider what activities are safe for you. °Avoid sitting for a long time without moving. Get up to take short walks every 1-2 hours. °General instructions °Take over-the-counter and prescription medicines only as told by your health care provider. °Do not take baths, swim, or use a hot tub until your health care provider approves. Ask if you may take showers. You may only be allowed to take sponge baths. °It is normal to have vaginal bleeding after delivery. Wear a sanitary pad for vaginal bleeding and discharge. °Keep all follow-up visits. This is important. °Where to find more information °American Pregnancy Association: americanpregnancy.org °American College of Obstetricians and Gynecologists: acog.org °Summary °Vaginal birth after cesarean delivery (VBAC) means giving birth vaginally after previously delivering a baby through a cesarean section, or C-section. °Once you are in labor and have been admitted into the hospital or birth center, your health care team may review your pregnancy history and go over any concerns you have. °Although most women are able to have a successful VBAC, sometimes it is necessary to have another  C-section. °Keep all follow-up visits. This is important. °This information is not intended to replace advice given to you by your health care provider. Make sure you discuss any questions you have with your health care provider. °Document Revised: 05/14/2020 Document Reviewed: 05/14/2020 °Elsevier Patient Education © 2022 [employer] ° °

## 2021-06-23 NOTE — Progress Notes (Signed)
Routine Prenatal Care Visit  Subjective  Carrie [name] is a 26 y.o. G2P1001 at [redacted]w[redacted]d being seen today for ongoing prenatal care.  She is currently monitored for the following issues for this low-risk pregnancy and has Perirectal abscess; Supervision of normal pregnancy; History of cesarean delivery affecting pregnancy; Nausea and vomiting during pregnancy; Marijuana smoker; and Elevated glucose tolerance test on their problem list.  ----------------------------------------------------------------------------------- Patient reports no complaints.   Contractions: Not present. Vag. Bleeding: None.  Movement: Present. Leaking Fluid denies.  ----------------------------------------------------------------------------------- The following portions of the patient's history were reviewed and updated as appropriate: allergies, current medications, past family history, past medical history, past social history, past surgical history and problem list. Problem list updated.  Objective  Blood pressure 120/80, weight 173 lb (78.5 kg), last menstrual period 10/18/2020, not currently breastfeeding. Pregravid weight 133 lb (60.3 kg) Total Weight Gain 40 lb (18.1 kg) Urinalysis: Urine Protein    Urine Glucose    Fetal Status: Fetal Heart Rate (bpm): 137 Fundal Height: 35 cm Movement: Present     General:  Alert, oriented and cooperative. Patient is in no acute distress.  Skin: Skin is warm and dry. No rash noted.   Cardiovascular: Normal heart rate noted  Respiratory: Normal respiratory effort, no problems with respiration noted  Abdomen: Soft, gravid, appropriate for gestational age. Pain/Pressure: Absent     Pelvic:  Cervical exam deferred        Extremities: Normal range of motion.  Edema: None  Mental Status: Normal mood and affect. Normal behavior. Normal judgment and thought content.   Assessment   26 y.o. G2P1001 at [redacted]w[redacted]d by  07/25/2021, by Last Menstrual Period presenting for routine prenatal  visit  Plan   pregnancy2  Problems (from 01/18/21 to present)    Problem Noted Resolved   Elevated glucose tolerance test 05/11/2021 by Mirna Mires, CNM No   Overview Addendum 06/04/2021  5:49 PM by Mirna Mires, CNM    12/13 notified of need for 3 hr testing Passed 3 hour testing.      Marijuana smoker 04/12/2021 by Mirna Mires, CNM No   Overview Addendum 05/10/2021 10:13 AM by Mirna Mires, CNM    Admits to MJ use this pregnancy. Counseled UDS ordered at 28 week lab draw      History of cesarean delivery affecting pregnancy 02/15/2021 by Conard Novak, MD No   Nausea and vomiting during pregnancy 02/15/2021 by Conard Novak, MD No   Supervision of normal pregnancy 01/20/2021 by Tresea Mall, CNM No   Overview Addendum 06/07/2021  9:24 AM by Natale Milch, MD     Nursing Staff Provider  Office Location  [facility] Dating   CW LMP  Language  English Anatomy US  Normal female  Flu Vaccine   Genetic Screen  NIPS: normal xy  TDaP vaccine   06/07/2020 Hgb A1C or  GTT Early : NA Third trimester : elevated but passed 3 hr test  Covid    LAB RESULTS   Rhogam  NA Blood Type A/Positive/-- (08/22 1628)   Feeding Plan Breast Antibody Negative (08/22 1628)  Contraception Nexplanon  Rubella 1.01 (08/22 1628)  Circumcision yes RPR Non Reactive (08/22 1628)   Pediatrician  [place]  HBsAg Negative (08/22 1628)   Support Person Francee Piccolo HIV Non Reactive (08/22 1628)  Prenatal Classes multip Varicella Non-immune    GBS  (For PCN allergy, check sensitivities)   BTL Consent     VBAC Consent Primary June  2021 Pap  2020 negative    Hgb Electro   normal  Pelvis Tested  CF      SMA                   Preterm labor symptoms and general obstetric precautions including but not limited to vaginal bleeding, contractions, leaking of fluid and fetal movement were reviewed in detail with the patient. Please refer to After Visit Summary for other counseling  recommendations.   Return in about 1 week (around 06/30/2021) for rob.  Tresea Mall, CNM 06/23/2021 8:27 AM

## 2021-06-29 ENCOUNTER — Encounter: Payer: Self-pay | Admitting: Licensed Practical Nurse

## 2021-06-29 ENCOUNTER — Ambulatory Visit (INDEPENDENT_AMBULATORY_CARE_PROVIDER_SITE_OTHER): Payer: Medicaid Other | Admitting: Licensed Practical Nurse

## 2021-06-29 ENCOUNTER — Other Ambulatory Visit: Payer: Self-pay

## 2021-06-29 VITALS — BP 126/84 | Wt 174.0 lb

## 2021-06-29 DIAGNOSIS — Z3A36 36 weeks gestation of pregnancy: Secondary | ICD-10-CM

## 2021-06-29 DIAGNOSIS — Z3480 Encounter for supervision of other normal pregnancy, unspecified trimester: Secondary | ICD-10-CM

## 2021-06-29 DIAGNOSIS — Z113 Encounter for screening for infections with a predominantly sexual mode of transmission: Secondary | ICD-10-CM

## 2021-06-29 DIAGNOSIS — Z3685 Encounter for antenatal screening for Streptococcus B: Secondary | ICD-10-CM

## 2021-06-29 NOTE — Progress Notes (Signed)
GBS today. No vb. No lof.  

## 2021-06-29 NOTE — Progress Notes (Signed)
Routine Prenatal Care Visit  Subjective  Carrie [name] is a 26 y.o. G2P1001 at [redacted]w[redacted]d being seen today for ongoing prenatal care.  She is currently monitored for the following issues for this low-risk pregnancy and has Perirectal abscess; Supervision of normal pregnancy; History of cesarean delivery affecting pregnancy; Nausea and vomiting during pregnancy; Marijuana smoker; and Elevated glucose tolerance test on their problem list.  ----------------------------------------------------------------------------------- Patient reports no complaints.  Doing well.  Sleep is best on the couch.  Starting to feel a little nervous about birth. Has great sup[port system, both his and her's mom, partner will have 6 weeks off.  Contractions: Not present. Vag. Bleeding: None.  Movement: Present. Leaking Fluid denies.  ----------------------------------------------------------------------------------- The following portions of the patient's history were reviewed and updated as appropriate: allergies, current medications, past family history, past medical history, past social history, past surgical history and problem list. Problem list updated.  Objective  Blood pressure 126/84, weight 174 lb (78.9 kg), last menstrual period 10/18/2020, not currently breastfeeding. Pregravid weight 133 lb (60.3 kg) Total Weight Gain 41 lb (18.6 kg) Urinalysis: Urine Protein    Urine Glucose    Fetal Status: Fetal Heart Rate (bpm): 140 Fundal Height: 36 cm Movement: Present  Presentation: Vertex BSUS confirms vertex General:  Alert, oriented and cooperative. Patient is in no acute distress.  Skin: Skin is warm and dry. No rash noted.   Cardiovascular: Normal heart rate noted  Respiratory: Normal respiratory effort, no problems with respiration noted  Abdomen: Soft, gravid, appropriate for gestational age. Pain/Pressure: Absent     Pelvic:  Cervical exam deferred        Extremities: Normal range of motion.  Edema: None   Mental Status: Normal mood and affect. Normal behavior. Normal judgment and thought content.   Assessment   26 y.o. G2P1001 at [redacted]w[redacted]d by  07/25/2021, by Last Menstrual Period presenting for routine prenatal visit  Plan   pregnancy2  Problems (from 01/18/21 to present)     Problem Noted Resolved   Elevated glucose tolerance test 05/11/2021 by Mirna Mires, CNM No   Overview Addendum 06/04/2021  5:49 PM by Mirna Mires, CNM    12/13 notified of need for 3 hr testing Passed 3 hour testing.      Marijuana smoker 04/12/2021 by Mirna Mires, CNM No   Overview Addendum 05/10/2021 10:13 AM by Mirna Mires, CNM    Admits to MJ use this pregnancy. Counseled UDS ordered at 28 week lab draw      History of cesarean delivery affecting pregnancy 02/15/2021 by Conard Novak, MD No   Nausea and vomiting during pregnancy 02/15/2021 by Conard Novak, MD No   Supervision of normal pregnancy 01/20/2021 by Tresea Mall, CNM No   Overview Addendum 06/07/2021  9:24 AM by Natale Milch, MD     Nursing Staff Provider  Office Location  [facility] Dating   CW LMP  Language  English Anatomy US  Normal female  Flu Vaccine   Genetic Screen  NIPS: normal xy  TDaP vaccine   06/07/2020 Hgb A1C or  GTT Early : NA Third trimester : elevated but passed 3 hr test  Covid    LAB RESULTS   Rhogam  NA Blood Type A/Positive/-- (08/22 1628)   Feeding Plan Breast Antibody Negative (08/22 1628)  Contraception Nexplanon  Rubella 1.01 (08/22 1628)  Circumcision yes RPR Non Reactive (08/22 1628)   Pediatrician  [place]  HBsAg Negative (08/22 1628)  Support Person Francee Piccolo HIV Non Reactive (08/22 1628)  Prenatal Classes multip Varicella Non-immune    GBS  (For PCN allergy, check sensitivities)   BTL Consent     VBAC Consent Primary June 2021 Pap  2020 negative    Hgb Electro   normal  Pelvis Tested  CF      SMA                    Preterm labor symptoms and general obstetric  precautions including but not limited to vaginal bleeding, contractions, leaking of fluid and fetal movement were reviewed in detail with the patient. Please refer to After Visit Summary for other counseling recommendations.   Return in about 1 week (around 07/06/2021) for ROB.  36 week labs collected  [name], CNM  Domingo Pulse, [facility]  06/29/21  9:56 AM

## 2021-07-02 LAB — GC/CHLAMYDIA PROBE AMP
Chlamydia trachomatis, NAA: NEGATIVE
Neisseria Gonorrhoeae by PCR: NEGATIVE

## 2021-07-03 LAB — CULTURE, BETA STREP (GROUP B ONLY): Strep Gp B Culture: NEGATIVE

## 2021-07-06 ENCOUNTER — Ambulatory Visit (INDEPENDENT_AMBULATORY_CARE_PROVIDER_SITE_OTHER): Payer: Medicaid Other | Admitting: Obstetrics

## 2021-07-06 ENCOUNTER — Other Ambulatory Visit: Payer: Self-pay

## 2021-07-06 VITALS — BP 118/62 | Wt 183.0 lb

## 2021-07-06 DIAGNOSIS — Z3A37 37 weeks gestation of pregnancy: Secondary | ICD-10-CM

## 2021-07-06 DIAGNOSIS — Z3480 Encounter for supervision of other normal pregnancy, unspecified trimester: Secondary | ICD-10-CM

## 2021-07-06 LAB — POCT URINALYSIS DIPSTICK
Glucose, UA: NEGATIVE
Protein, UA: POSITIVE — AB

## 2021-07-06 NOTE — Progress Notes (Signed)
Routine Prenatal Care Visit  Subjective  Carrie [name] is a 26 y.o. G2P1001 at [redacted]w[redacted]d being seen today for ongoing prenatal care.  She is currently monitored for the following issues for this high-risk pregnancy and has Perirectal abscess; Supervision of normal pregnancy; History of cesarean delivery affecting pregnancy; Nausea and vomiting during pregnancy; Marijuana smoker; and Elevated glucose tolerance test on their problem list.  ----------------------------------------------------------------------------------- Patient reports no complaints.  STong odor of MJ in the exam room. She is still using daily- shares that otherwise she is nauseous. Has heartburn. Too. Has Diclegis at home. Contractions: Not present. Vag. Bleeding: None.  Movement: Present. Leaking Fluid denies.  ----------------------------------------------------------------------------------- The following portions of the patient's history were reviewed and updated as appropriate: allergies, current medications, past family history, past medical history, past social history, past surgical history and problem list. Problem list updated.  Objective  Blood pressure 118/62, weight 183 lb (83 kg), last menstrual period 10/18/2020, not currently breastfeeding. Pregravid weight 133 lb (60.3 kg) Total Weight Gain 50 lb (22.7 kg) Urinalysis: Urine Protein    Urine Glucose    Fetal Status:     Movement: Present     General:  Alert, oriented and cooperative. Patient is in no acute distress.  Skin: Skin is warm and dry. No rash noted.   Cardiovascular: Normal heart rate noted  Respiratory: Normal respiratory effort, no problems with respiration noted  Abdomen: Soft, gravid, appropriate for gestational age. Pain/Pressure: Present     Pelvic:  Cervical exam deferred        Extremities: Normal range of motion.     Mental Status: Normal mood and affect. Normal behavior. Normal judgment and thought content.   Assessment   26 y.o. G2P1001  at [redacted]w[redacted]d by  07/25/2021, by Last Menstrual Period presenting for routine prenatal visit  Plan   pregnancy2  Problems (from 01/18/21 to present)    Problem Noted Resolved   Elevated glucose tolerance test 05/11/2021 by Mirna Mires, CNM No   Overview Addendum 06/04/2021  5:49 PM by Mirna Mires, CNM    12/13 notified of need for 3 hr testing Passed 3 hour testing.      Marijuana smoker 04/12/2021 by Mirna Mires, CNM No   Overview Addendum 05/10/2021 10:13 AM by Mirna Mires, CNM    Admits to MJ use this pregnancy. Counseled UDS ordered at 28 week lab draw      History of cesarean delivery affecting pregnancy 02/15/2021 by Conard Novak, MD No   Nausea and vomiting during pregnancy 02/15/2021 by Conard Novak, MD No   Supervision of normal pregnancy 01/20/2021 by Tresea Mall, CNM No   Overview Addendum 07/06/2021  3:04 PM by Mirna Mires, CNM     Nursing Staff Provider  Office Location  [facility] Dating   CW LMP  Language  English Anatomy US  Normal female  Flu Vaccine   Genetic Screen  NIPS: normal xy  TDaP vaccine   06/07/2020 Hgb A1C or  GTT Early : NA Third trimester : elevated but passed 3 hr test  Covid    LAB RESULTS   Rhogam  NA Blood Type A/Positive/-- (08/22 1628)   Feeding Plan Breast Antibody Negative (08/22 1628)  Contraception Nexplanon  Rubella 1.01 (08/22 1628)  Circumcision yes RPR Non Reactive (08/22 1628)   Pediatrician  [place]  HBsAg Negative (08/22 1628)   Support Person Francee Piccolo HIV Non Reactive (08/22 1628)  Prenatal Classes multip Varicella Non-immune  GBS  (For PCN allergy,negative  BTL Consent     VBAC Consent Primary June 2021 Pap  2020 negative    Hgb Electro   normal  Pelvis Tested  CF      SMA                   Term labor symptoms and general obstetric precautions including but not limited to vaginal bleeding, contractions, leaking of fluid and fetal movement were reviewed in detail with the  patient. Please refer to After Visit Summary for other counseling recommendations.  Her GBS is negative Advised that THC is likely harmful to her developing baby- Suggested daily Pepcid and Maalox for heartburn.  Return in about 2 weeks (around 07/20/2021) for return OB.  Imagene Riches, CNM  07/06/2021 3:05 PM

## 2021-07-06 NOTE — Addendum Note (Signed)
Addended by: Liliane Shi on: 07/06/2021 03:33 PM   Modules accepted: Orders

## 2021-07-16 ENCOUNTER — Other Ambulatory Visit: Payer: Self-pay

## 2021-07-16 ENCOUNTER — Encounter: Payer: Self-pay | Admitting: Advanced Practice Midwife

## 2021-07-16 ENCOUNTER — Ambulatory Visit (INDEPENDENT_AMBULATORY_CARE_PROVIDER_SITE_OTHER): Payer: Medicaid Other | Admitting: Advanced Practice Midwife

## 2021-07-16 VITALS — BP 120/80 | Wt 190.0 lb

## 2021-07-16 DIAGNOSIS — Z3483 Encounter for supervision of other normal pregnancy, third trimester: Secondary | ICD-10-CM

## 2021-07-16 DIAGNOSIS — Z3A38 38 weeks gestation of pregnancy: Secondary | ICD-10-CM | POA: Diagnosis not present

## 2021-07-16 LAB — POCT URINALYSIS DIPSTICK OB
Glucose, UA: NEGATIVE
POC,PROTEIN,UA: NEGATIVE

## 2021-07-16 NOTE — Patient Instructions (Signed)
Pain Relief During Labor and Delivery Many things can cause pain during labor and delivery, including: Pressure due to the baby moving through the pelvis. Stretching of tissues due to the baby moving through the birth canal. Muscle tension due to anxiety or nervousness. The uterus tightening (contracting)and relaxing to help move the baby. How do I get pain relief during labor and delivery? Discuss your pain relief options with your health care provider during your prenatal visits. Explore the options offered by your hospital or birth center. There are many ways to deal with the pain of labor and delivery. You can try relaxation techniques or doing relaxing activities, taking a warm shower or bath (hydrotherapy), or other methods. There are also many medicines available to help control pain. Relaxation techniques and activities Practice relaxation techniques or do relaxing activities, such as: Focused breathing. Meditation. Visualization. Aroma therapy. Listening to your favorite music. Hypnosis. Hydrotherapy Take a warm shower or bath. This may: Provide comfort and relaxation. Lessen your feeling of pain. Reduce the amount of pain medicine needed. Shorten the length of labor. Other methods Try doing other things, such as: Getting a massage or having counterpressure on your back. Applying warm packs or ice packs. Changing positions often, moving around, or using a birthing ball. Medicines You may be given: Pain medicine through an IV or an injection into a muscle. Pain medicine inserted into your spinal column. Injections of sterile water just under the skin on your lower back. Nitrous oxide inhalation therapy, also called laughing gas. What kinds of medicine are available for pain relief? There are two kinds of medicines that can be used to relieve pain during labor and delivery: Analgesics. These medicines decrease pain without causing you to lose feeling or the ability to move  your muscles. Anesthetics. These medicines block feeling in the body and can decrease your ability to move freely. Both kinds of medicine can cause minor side effects, such as nausea, trouble concentrating, and sleepiness. They can also affect the baby's heart rate before birth and his or her breathing after birth. For this reason, health care providers are careful about when and how much medicine is given. Which medicines are used to provide pain relief? Common medicines The most common medicines used to help manage pain during labor and delivery include: Opioids. Opioids are medicines that decrease how much pain you feel (perception of pain). These medicines can be given through an IV or may be used with anesthetics to block pain. Epidural analgesia. Epidural analgesia is given through a very thin tube that is inserted into the lower back. Medicine is delivered continuously to the area near your spinal column nerves (epidural space). After having this treatment, you may be able to move your legs, but you will not be able to walk. Depending on the amount and type of medicine given, you may lose all feeling in the lower half of your body, or you may have some sensation, including the urge to push. This treatment can be used to give pain relief for a vaginal birth. Sometimes, a numbing medicine is injected into the spinal fluid when an epidural catheter is placed. This provides for immediate relief but only lasts for 1-2 hours. Once it wears off, the epidural will provide pain relief. This is called a combined spinal-epidural (CSE) block. Intrathecal analgesia (spinal analgesia). Intrathecal analgesia is similar to epidural analgesia, but the medicine is injected into the spinal fluid instead of the epidural space. It is usually only given once. It  starts to relieve pain quickly, but the pain relief lasts only 1-2 hours. °Pudendal block. This block is done by injecting numbing medicine through the wall of  the vagina and into a nerve in the pelvis. °Other medicines °Other medicines used to help manage pain during labor and delivery include: °Local anesthetics. These are used to numb a small area of the body. They may be used along with another kind of medicine or used to numb the nerves of the vagina, cervix, and perineum during the second stage of labor. °Spinal block (spinal anesthesia). Spinal anesthesia is similar to spinal analgesia, but the medicine that is used contains longer-acting numbing medicines and pain medicines. This type of anesthesia can be used for a cesarean delivery and allows you to stay awake for the birth of your baby. °General anesthetics cause you to lose consciousness so you do not feel pain. They are usually only used for an emergency cesarean delivery. These medicines are given through an IV or a mask or both. °These medicines are used as part of a procedure or for an emergency delivery. °Summary °Women have many options to help them manage the pain associated with labor and delivery. °You can try doing relaxing activities, taking a warm shower or bath, or other methods. °There are also many medicines available to help control pain during labor and delivery. °Talk with your health care provider about what options are available to you. °This information is not intended to replace advice given to you by your health care provider. Make sure you discuss any questions you have with your health care provider. °Document Revised: 04/03/2019 Document Reviewed: 04/03/2019 °Elsevier Patient Education © 2022 [employer] °Signs and Symptoms of Labor °Labor is the body's natural process of moving the baby and the placenta out of the uterus. The process of labor usually starts when the baby is full-term, between 39 and 41 weeks of pregnancy. °Signs and symptoms that you are close to going into labor °As your body prepares for labor and the birth of your baby, you may notice the following symptoms in  the weeks and days before true labor starts: °Passing a small amount of thick, bloody mucus from your vagina. This is called normal bloody show or losing your mucus plug. This may happen more than a week before labor begins, or right before labor begins, as the opening of the cervix starts to widen (dilate). For some women, the entire mucus plug passes at once. For others, pieces of the mucus plug may gradually pass over several days. °Your baby moving (dropping) lower in your pelvis to get into position for birth (lightening). When this happens, you may feel more pressure on your bladder and pelvic bone and less pressure on your ribs. This may make it easier to breathe. It may also cause you to need to urinate more often and have problems with bowel movements. °Having "practice contractions," also called Braxton Hicks contractions or false labor. These occur at irregular (unevenly spaced) intervals that are more than 10 minutes apart. False labor contractions are common after exercise or sexual activity. They will stop if you change position, rest, or drink fluids. These contractions are usually mild and do not get stronger over time. They may feel like: °A backache or back pain. °Mild cramps, similar to menstrual cramps. °Tightening or pressure in your abdomen. °Other early symptoms include: °Nausea or loss of appetite. °Diarrhea. °Having a sudden burst of energy, or feeling very tired. °Mood changes. °Having trouble sleeping. °  Signs and symptoms that labor has begun °Signs that you are in labor may include: °Having contractions that come at regular (evenly spaced) intervals and increase in intensity. This may feel like more intense tightening or pressure in your abdomen that moves to your back. °Contractions may also feel like rhythmic pain in your upper thighs or back that comes and goes at regular intervals. °If you are delivering for the first time, this change in intensity of contractions often occurs at a  more gradual pace. °If you have given birth before, you may notice a more rapid progression of contraction changes. °Feeling pressure in the vaginal area. °Your water breaking (rupture of membranes). This is when the sac of fluid that surrounds your baby breaks. Fluid leaking from your vagina may be clear or blood-tinged. Labor usually starts within 24 hours of your water breaking, but it may take longer to begin. °Some people may feel a sudden gush of fluid; others may notice repeatedly damp underwear. °Follow these instructions at home: ° °When labor starts, or if your water breaks, call your health care provider or nurse care line. Based on your situation, they will determine when you should go in for an exam. °During early labor, you may be able to rest and manage symptoms at home. Some strategies to try at home include: °Breathing and relaxation techniques. °Taking a warm bath or shower. °Listening to music. °Using a heating pad on the lower back for pain. If directed, apply heat to the area as often as told by your health care provider. Use the heat source that your health care provider recommends, such as a moist heat pack or a heating pad. °Place a towel between your skin and the heat source. °Leave the heat on for 20-30 minutes. °Remove the heat if your skin turns bright red. This is especially important if you are unable to feel pain, heat, or cold. You have a greater risk of getting burned. °Contact a health care provider if: °Your labor has started. °Your water breaks. °You have nausea, vomiting, or diarrhea. °Get help right away if: °You have painful, regular contractions that are 5 minutes apart or less. °Labor starts before you are [redacted] weeks along in your pregnancy. °You have a fever. °You have bright red blood coming from your vagina. °You do not feel your baby moving. °You have a severe headache with or without vision problems. °You have chest pain or shortness of breath. °These symptoms may  represent a serious problem that is an emergency. Do not wait to see if the symptoms will go away. Get medical help right away. Call your local emergency services (911 in the [place] Do not drive yourself to the hospital. °Summary °Labor is your body's natural process of moving your baby and the placenta out of your uterus. °The process of labor usually starts when your baby is full-term, between 39 and 40 weeks of pregnancy. °When labor starts, or if your water breaks, call your health care provider or nurse care line. Based on your situation, they will determine when you should go in for an exam. °This information is not intended to replace advice given to you by your health care provider. Make sure you discuss any questions you have with your health care provider. °Document Revised: 09/29/2020 Document Reviewed: 09/29/2020 °Elsevier Patient Education © 2022 [employer] ° °

## 2021-07-16 NOTE — Addendum Note (Signed)
Addended by: Quintella Baton D on: 07/16/2021 08:35 AM   Modules accepted: Orders

## 2021-07-16 NOTE — Progress Notes (Signed)
Routine Prenatal Care Visit  Subjective  Carrie [name] is a 26 y.o. G2P1001 at [redacted]w[redacted]d being seen today for ongoing prenatal care.  She is currently monitored for the following issues for this low-risk pregnancy and has Perirectal abscess; Supervision of normal pregnancy; History of cesarean delivery affecting pregnancy; Nausea and vomiting during pregnancy; Marijuana smoker; and Elevated glucose tolerance test on their problem list.  ----------------------------------------------------------------------------------- Patient reports no complaints.  Today is her last day of work. Contractions: Irregular. Vag. Bleeding: None.  Movement: Present. Leaking Fluid denies.  ----------------------------------------------------------------------------------- The following portions of the patient's history were reviewed and updated as appropriate: allergies, current medications, past family history, past medical history, past social history, past surgical history and problem list. Problem list updated.  Objective  Blood pressure 120/80, weight 190 lb (86.2 kg), last menstrual period 10/18/2020, not currently breastfeeding. Pregravid weight 133 lb (60.3 kg) Total Weight Gain 57 lb (25.9 kg) Urinalysis: Urine Protein    Urine Glucose    Fetal Status: Fetal Heart Rate (bpm): 132 Fundal Height: 39 cm Movement: Present     General:  Alert, oriented and cooperative. Patient is in no acute distress.  Skin: Skin is warm and dry. No rash noted.   Cardiovascular: Normal heart rate noted  Respiratory: Normal respiratory effort, no problems with respiration noted  Abdomen: Soft, gravid, appropriate for gestational age. Pain/Pressure: Present     Pelvic:  Cervical exam deferred        Extremities: Normal range of motion.  Edema: None  Mental Status: Normal mood and affect. Normal behavior. Normal judgment and thought content.   Assessment   26 y.o. G2P1001 at [redacted]w[redacted]d by  07/25/2021, by Last Menstrual Period  presenting for routine prenatal visit  Plan   pregnancy2  Problems (from 01/18/21 to present)    Problem Noted Resolved   Elevated glucose tolerance test 05/11/2021 by Mirna Mires, CNM No   Overview Addendum 06/04/2021  5:49 PM by Mirna Mires, CNM    12/13 notified of need for 3 hr testing Passed 3 hour testing.      Marijuana smoker 04/12/2021 by Mirna Mires, CNM No   Overview Addendum 05/10/2021 10:13 AM by Mirna Mires, CNM    Admits to MJ use this pregnancy. Counseled UDS ordered at 28 week lab draw      History of cesarean delivery affecting pregnancy 02/15/2021 by Conard Novak, MD No   Nausea and vomiting during pregnancy 02/15/2021 by Conard Novak, MD No   Supervision of normal pregnancy 01/20/2021 by Tresea Mall, CNM No   Overview Addendum 07/06/2021  3:04 PM by Mirna Mires, CNM     Nursing Staff Provider  Office Location  [facility] Dating   CW LMP  Language  English Anatomy US  Normal female  Flu Vaccine   Genetic Screen  NIPS: normal xy  TDaP vaccine   06/07/2020 Hgb A1C or  GTT Early : NA Third trimester : elevated but passed 3 hr test  Covid    LAB RESULTS   Rhogam  NA Blood Type A/Positive/-- (08/22 1628)   Feeding Plan Breast Antibody Negative (08/22 1628)  Contraception Nexplanon  Rubella 1.01 (08/22 1628)  Circumcision yes RPR Non Reactive (08/22 1628)   Pediatrician  [place]  HBsAg Negative (08/22 1628)   Support Person Francee Piccolo HIV Non Reactive (08/22 1628)  Prenatal Classes multip Varicella Non-immune    GBS  (For PCN allergy,negative  BTL Consent     VBAC Consent  Primary June 2021 Pap  2020 negative    Hgb Electro   normal  Pelvis Tested  CF      SMA                   Term labor symptoms and general obstetric precautions including but not limited to vaginal bleeding, contractions, leaking of fluid and fetal movement were reviewed in detail with the patient. Please refer to After Visit Summary for other  counseling recommendations.   Return in about 1 week (around 07/23/2021) for rob.  Tresea Mall, CNM 07/16/2021 8:30 AM

## 2021-07-18 ENCOUNTER — Observation Stay (HOSPITAL_BASED_OUTPATIENT_CLINIC_OR_DEPARTMENT_OTHER)
Admission: EM | Admit: 2021-07-18 | Discharge: 2021-07-18 | Disposition: A | Discharge status: Home / Self Care | Payer: Medicaid Other | Source: Home / Self Care | Admitting: Obstetrics and Gynecology

## 2021-07-18 DIAGNOSIS — Z3A39 39 weeks gestation of pregnancy: Secondary | ICD-10-CM

## 2021-07-18 DIAGNOSIS — R109 Unspecified abdominal pain: Secondary | ICD-10-CM | POA: Diagnosis not present

## 2021-07-18 DIAGNOSIS — O26893 Other specified pregnancy related conditions, third trimester: Secondary | ICD-10-CM

## 2021-07-18 DIAGNOSIS — Z3490 Encounter for supervision of normal pregnancy, unspecified, unspecified trimester: Secondary | ICD-10-CM

## 2021-07-18 DIAGNOSIS — O471 False labor at or after 37 completed weeks of gestation: Secondary | ICD-10-CM | POA: Insufficient documentation

## 2021-07-18 NOTE — Progress Notes (Signed)
Pt states she would like to just go home. Reviewed early labor and pt understands

## 2021-07-18 NOTE — Progress Notes (Signed)
° ° °  L&D OB Triage Note  SUBJECTIVE Carrie [name] is a 26 y.o. G2P1001 female at [redacted]w[redacted]d, EDD Estimated Date of Delivery: 07/25/21 who presented to triage with complaints of contraction. She denies lof, vaginal bleeding and is feeling good fetal movement.    OB History  Gravida Para Term Preterm AB Living  2 1 1  0 0 1  SAB IAB Ectopic Multiple Live Births  0 0 0 0 1    # Outcome Date GA Lbr Len/2nd Weight Sex Delivery Anes PTL Lv  2 Current           1 Term 11/12/19 [redacted]w[redacted]d  3100 g M CS-LTranv Spinal  LIV     Name: Recendiz,BOY Izadora     Apgar1: 8  Apgar5: 9    No medications prior to admission.     OBJECTIVE  Nursing Evaluation:   LMP 10/18/2020    Findings:        Irregular mild contractions      NST was performed and has been reviewed by me.  NST INTERPRETATION: Category I  Mode: External Baseline Rate (A): 130 bpm Variability: Moderate Accelerations: 15 x 15 Decelerations: None     Contraction Frequency (min): 2-5  ASSESSMENT Impression:  1.  Pregnancy:  G2P1001 at [redacted]w[redacted]d , EDD Estimated Date of Delivery: 07/25/21 2.  Reassuring fetal and maternal status 3.  Irregular mild contractions  PLAN 1. Current condition and above findings reviewed.  Reassuring fetal and maternal condition.Cervix 1 cm, pt given option to stay and ambulate or go home and wait for active labor. Request to go home.  2. Discharge home with standard labor precautions given to return to L&D or call the office for problems. 3. Continue routine prenatal care  07/27/21, CNM

## 2021-07-18 NOTE — OB Triage Note (Signed)
Presents for complaint of contractions since 1230 today that has become stronger.Denies any bleeding or leaking of fluid. Denies any health problems during pregnancy.

## 2021-07-19 ENCOUNTER — Encounter
Admission: EM | Disposition: A | Discharge status: Home / Self Care | Payer: Self-pay | Source: Home / Self Care | Attending: Advanced Practice Midwife

## 2021-07-19 ENCOUNTER — Inpatient Hospital Stay: Payer: Medicaid Other | Admitting: Anesthesiology

## 2021-07-19 ENCOUNTER — Other Ambulatory Visit: Payer: Self-pay

## 2021-07-19 ENCOUNTER — Encounter: Payer: Self-pay | Admitting: Obstetrics and Gynecology

## 2021-07-19 ENCOUNTER — Inpatient Hospital Stay
Admission: EM | Admit: 2021-07-19 | Discharge: 2021-07-21 | DRG: 788 | Disposition: A | Discharge status: Home / Self Care | Payer: Medicaid Other | Attending: Advanced Practice Midwife | Admitting: Advanced Practice Midwife

## 2021-07-19 DIAGNOSIS — Z3490 Encounter for supervision of normal pregnancy, unspecified, unspecified trimester: Principal | ICD-10-CM

## 2021-07-19 DIAGNOSIS — Z87891 Personal history of nicotine dependence: Secondary | ICD-10-CM | POA: Diagnosis not present

## 2021-07-19 DIAGNOSIS — O34211 Maternal care for low transverse scar from previous cesarean delivery: Principal | ICD-10-CM | POA: Diagnosis present

## 2021-07-19 DIAGNOSIS — Z3493 Encounter for supervision of normal pregnancy, unspecified, third trimester: Secondary | ICD-10-CM | POA: Diagnosis present

## 2021-07-19 DIAGNOSIS — Z20822 Contact with and (suspected) exposure to covid-19: Secondary | ICD-10-CM | POA: Diagnosis present

## 2021-07-19 DIAGNOSIS — O9903 Anemia complicating the puerperium: Secondary | ICD-10-CM | POA: Diagnosis not present

## 2021-07-19 DIAGNOSIS — Z3A39 39 weeks gestation of pregnancy: Secondary | ICD-10-CM

## 2021-07-19 DIAGNOSIS — O99324 Drug use complicating childbirth: Secondary | ICD-10-CM | POA: Diagnosis not present

## 2021-07-19 DIAGNOSIS — F1210 Cannabis abuse, uncomplicated: Secondary | ICD-10-CM | POA: Diagnosis not present

## 2021-07-19 DIAGNOSIS — D62 Acute posthemorrhagic anemia: Secondary | ICD-10-CM | POA: Diagnosis not present

## 2021-07-19 LAB — RESP PANEL BY RT-PCR (FLU A&B, COVID) ARPGX2
Influenza A by PCR: NEGATIVE
Influenza B by PCR: NEGATIVE
SARS Coronavirus 2 by RT PCR: NEGATIVE

## 2021-07-19 LAB — URINE DRUG SCREEN, QUALITATIVE (ARMC ONLY)
Amphetamines, Ur Screen: NOT DETECTED
Barbiturates, Ur Screen: NOT DETECTED
Benzodiazepine, Ur Scrn: NOT DETECTED
Cannabinoid 50 Ng, Ur ~~LOC~~: POSITIVE — AB
Cocaine Metabolite,Ur ~~LOC~~: NOT DETECTED
MDMA (Ecstasy)Ur Screen: NOT DETECTED
Methadone Scn, Ur: NOT DETECTED
Opiate, Ur Screen: NOT DETECTED
Phencyclidine (PCP) Ur S: NOT DETECTED
Tricyclic, Ur Screen: NOT DETECTED

## 2021-07-19 LAB — TYPE AND SCREEN
ABO/RH(D): A POS
Antibody Screen: NEGATIVE

## 2021-07-19 LAB — CBC
HCT: 36.6 % (ref 36.0–46.0)
Hemoglobin: 12.2 g/dL (ref 12.0–15.0)
MCH: 29.6 pg (ref 26.0–34.0)
MCHC: 33.3 g/dL (ref 30.0–36.0)
MCV: 88.8 fL (ref 80.0–100.0)
Platelets: 308 10*3/uL (ref 150–400)
RBC: 4.12 MIL/uL (ref 3.87–5.11)
RDW: 14 % (ref 11.5–15.5)
WBC: 13.7 10*3/uL — ABNORMAL HIGH (ref 4.0–10.5)
nRBC: 0 % (ref 0.0–0.2)

## 2021-07-19 LAB — RPR: RPR Ser Ql: NONREACTIVE

## 2021-07-19 SURGERY — Surgical Case
Anesthesia: Epidural

## 2021-07-19 MED ORDER — LIDOCAINE HCL (PF) 2 % IJ SOLN
INTRAMUSCULAR | Status: DC | PRN
  Administered 2021-07-19 (×5): 100 mg via EPIDURAL

## 2021-07-19 MED ORDER — FENTANYL-BUPIVACAINE-NACL 0.5-0.125-0.9 MG/250ML-% EP SOLN
EPIDURAL | Status: DC | PRN
  Administered 2021-07-19: 12 mL/h via EPIDURAL

## 2021-07-19 MED ORDER — CEFAZOLIN SODIUM-DEXTROSE 2-4 GM/100ML-% IV SOLN
2.0000 g | INTRAVENOUS | Status: AC
  Administered 2021-07-19: 2 g via INTRAVENOUS

## 2021-07-19 MED ORDER — SOD CITRATE-CITRIC ACID 500-334 MG/5ML PO SOLN: 30.0000 mL | ORAL | Status: DC | PRN

## 2021-07-19 MED ORDER — FENTANYL CITRATE (PF) 100 MCG/2ML IJ SOLN
INTRAMUSCULAR | Status: DC | PRN
  Administered 2021-07-19: 100 ug via EPIDURAL

## 2021-07-19 MED ORDER — BUPIVACAINE LIPOSOME 1.3 % IJ SUSP
INTRAMUSCULAR | Status: AC
  Filled 2021-07-19: qty 20

## 2021-07-19 MED ORDER — FENTANYL-BUPIVACAINE-NACL 0.5-0.125-0.9 MG/250ML-% EP SOLN
EPIDURAL | Status: AC
  Filled 2021-07-19: qty 250

## 2021-07-19 MED ORDER — OXYTOCIN BOLUS FROM INFUSION: 333.0000 mL | Freq: Once | INTRAVENOUS | Status: DC

## 2021-07-19 MED ORDER — LIDOCAINE-EPINEPHRINE (PF) 1.5 %-1:200000 IJ SOLN
INTRAMUSCULAR | Status: DC | PRN
  Administered 2021-07-19: 3 mL via PERINEURAL

## 2021-07-19 MED ORDER — PHENYLEPHRINE 40 MCG/ML (10ML) SYRINGE FOR IV PUSH (FOR BLOOD PRESSURE SUPPORT)
PREFILLED_SYRINGE | INTRAVENOUS | Status: AC
Start: 2021-07-19 — End: 2021-07-19
  Administered 2021-07-19: 40 ug
  Filled 2021-07-19: qty 10

## 2021-07-19 MED ORDER — SOD CITRATE-CITRIC ACID 500-334 MG/5ML PO SOLN: 30.0000 mL | ORAL | Status: DC

## 2021-07-19 MED ORDER — LIDOCAINE HCL (PF) 1 % IJ SOLN
INTRAMUSCULAR | Status: AC
Start: 2021-07-19 — End: 2021-07-19
  Filled 2021-07-19: qty 30

## 2021-07-19 MED ORDER — SODIUM CHLORIDE (PF) 0.9 % IJ SOLN
INTRAMUSCULAR | Status: AC
  Filled 2021-07-19: qty 50

## 2021-07-19 MED ORDER — LACTATED RINGERS IV SOLN
500.0000 mL | INTRAVENOUS | Status: DC | PRN
  Administered 2021-07-19: 500 mL via INTRAVENOUS

## 2021-07-19 MED ORDER — BUTORPHANOL TARTRATE 1 MG/ML IJ SOLN
1.0000 mg | INTRAMUSCULAR | Status: DC | PRN
  Administered 2021-07-19 (×4): 1 mg via INTRAVENOUS
  Filled 2021-07-19 (×4): qty 1

## 2021-07-19 MED ORDER — MIDAZOLAM HCL 2 MG/2ML IJ SOLN
INTRAMUSCULAR | Status: DC | PRN
  Administered 2021-07-19 (×2): 1 mg via INTRAVENOUS

## 2021-07-19 MED ORDER — OXYTOCIN 10 UNIT/ML IJ SOLN
INTRAMUSCULAR | Status: AC
  Filled 2021-07-19: qty 2

## 2021-07-19 MED ORDER — BUPIVACAINE HCL (PF) 0.25 % IJ SOLN
INTRAMUSCULAR | Status: DC | PRN
Start: 2021-07-19 — End: 2021-07-20
  Administered 2021-07-19: 4 mL via EPIDURAL
  Administered 2021-07-19: 3 mL via EPIDURAL

## 2021-07-19 MED ORDER — ACETAMINOPHEN 325 MG PO TABS: 650.0000 mg | ORAL_TABLET | ORAL | Status: DC | PRN

## 2021-07-19 MED ORDER — FENTANYL CITRATE (PF) 100 MCG/2ML IJ SOLN
INTRAMUSCULAR | Status: AC
Start: 2021-07-19 — End: ?
  Filled 2021-07-19: qty 2

## 2021-07-19 MED ORDER — BUPIVACAINE HCL (PF) 0.5 % IJ SOLN
INTRAMUSCULAR | Status: DC | PRN
  Administered 2021-07-19: 30 mL
  Administered 2021-07-19: 20 mL

## 2021-07-19 MED ORDER — LACTATED RINGERS IV SOLN: INTRAVENOUS | Status: DC

## 2021-07-19 MED ORDER — OXYTOCIN-SODIUM CHLORIDE 30-0.9 UT/500ML-% IV SOLN
1.0000 m[IU]/min | INTRAVENOUS | Status: DC
  Administered 2021-07-19: 2 m[IU]/min via INTRAVENOUS

## 2021-07-19 MED ORDER — LIDOCAINE HCL (PF) 1 % IJ SOLN
INTRAMUSCULAR | Status: DC | PRN
  Administered 2021-07-19 (×3): 3 mL

## 2021-07-19 MED ORDER — ONDANSETRON HCL 4 MG/2ML IJ SOLN: 4.0000 mg | Freq: Four times a day (QID) | INTRAMUSCULAR | Status: DC | PRN

## 2021-07-19 MED ORDER — TERBUTALINE SULFATE 1 MG/ML IJ SOLN
0.2500 mg | Freq: Once | INTRAMUSCULAR | Status: AC | PRN
  Administered 2021-07-19: 0.25 mg via SUBCUTANEOUS
  Filled 2021-07-19: qty 1

## 2021-07-19 MED ORDER — PHENYLEPHRINE 40 MCG/ML (10ML) SYRINGE FOR IV PUSH (FOR BLOOD PRESSURE SUPPORT)
PREFILLED_SYRINGE | INTRAVENOUS | Status: DC | PRN
  Administered 2021-07-19 (×2): 80 ug via INTRAVENOUS

## 2021-07-19 MED ORDER — MIDAZOLAM HCL 2 MG/2ML IJ SOLN
INTRAMUSCULAR | Status: AC
Start: 2021-07-19 — End: ?
  Filled 2021-07-19: qty 2

## 2021-07-19 MED ORDER — MORPHINE SULFATE (PF) 0.5 MG/ML IJ SOLN
INTRAMUSCULAR | Status: DC | PRN
  Administered 2021-07-19: 3 mg via EPIDURAL

## 2021-07-19 MED ORDER — PHENYLEPHRINE HCL-NACL 20-0.9 MG/250ML-% IV SOLN
INTRAVENOUS | Status: AC
  Filled 2021-07-19: qty 250

## 2021-07-19 MED ORDER — OXYTOCIN-SODIUM CHLORIDE 30-0.9 UT/500ML-% IV SOLN
INTRAVENOUS | Status: DC | PRN
  Administered 2021-07-19: 199 mL via INTRAVENOUS
  Administered 2021-07-19: 1 mL via INTRAVENOUS

## 2021-07-19 MED ORDER — LIDOCAINE HCL (PF) 1 % IJ SOLN: 30.0000 mL | INTRAMUSCULAR | Status: DC | PRN

## 2021-07-19 MED ORDER — LIDOCAINE HCL (PF) 2 % IJ SOLN
INTRAMUSCULAR | Status: AC
  Filled 2021-07-19: qty 5

## 2021-07-19 MED ORDER — LIDOCAINE HCL (PF) 2 % IJ SOLN
INTRAMUSCULAR | Status: AC
  Filled 2021-07-19: qty 20

## 2021-07-19 MED ORDER — SOD CITRATE-CITRIC ACID 500-334 MG/5ML PO SOLN
ORAL | Status: AC
  Administered 2021-07-19: 30 mL
  Filled 2021-07-19: qty 15

## 2021-07-19 MED ORDER — CEFAZOLIN SODIUM-DEXTROSE 2-4 GM/100ML-% IV SOLN
INTRAVENOUS | Status: AC
  Filled 2021-07-19: qty 100

## 2021-07-19 MED ORDER — BUPIVACAINE HCL (PF) 0.5 % IJ SOLN
INTRAMUSCULAR | Status: AC
  Filled 2021-07-19: qty 30

## 2021-07-19 MED ORDER — ONDANSETRON HCL 4 MG/2ML IJ SOLN
INTRAMUSCULAR | Status: DC | PRN
  Administered 2021-07-19: 4 mg via INTRAVENOUS

## 2021-07-19 MED ORDER — OXYTOCIN-SODIUM CHLORIDE 30-0.9 UT/500ML-% IV SOLN
2.5000 [IU]/h | INTRAVENOUS | Status: DC
  Filled 2021-07-19 (×2): qty 500

## 2021-07-19 MED ORDER — PHENYLEPHRINE HCL-NACL 20-0.9 MG/250ML-% IV SOLN
INTRAVENOUS | Status: DC | PRN
  Administered 2021-07-19: 30 ug/min via INTRAVENOUS

## 2021-07-19 MED ORDER — MISOPROSTOL 200 MCG PO TABS
ORAL_TABLET | ORAL | Status: AC
  Filled 2021-07-19: qty 4

## 2021-07-19 MED ORDER — AMMONIA AROMATIC IN INHA
RESPIRATORY_TRACT | Status: AC
Start: 2021-07-19 — End: 2021-07-19
  Filled 2021-07-19: qty 10

## 2021-07-19 MED ORDER — MORPHINE SULFATE (PF) 0.5 MG/ML IJ SOLN
INTRAMUSCULAR | Status: AC
  Filled 2021-07-19: qty 10

## 2021-07-19 SURGICAL SUPPLY — 33 items
BACTOSHIELD CHG 4% 4OZ (MISCELLANEOUS) ×1
BAG COUNTER SPONGE SURGICOUNT (BAG) ×2 IMPLANT
BENZOIN TINCTURE PRP APPL 2/3 (GAUZE/BANDAGES/DRESSINGS) ×1 IMPLANT
BNDG TENSOPLAST 6X5 (GAUZE/BANDAGES/DRESSINGS) ×1 IMPLANT
CHLORAPREP W/TINT 26 (MISCELLANEOUS) ×4 IMPLANT
CLOSURE STERI STRIP 1/2 X4 (GAUZE/BANDAGES/DRESSINGS) ×1 IMPLANT
DRSG TELFA 3X8 NADH (GAUZE/BANDAGES/DRESSINGS) ×2 IMPLANT
ELECT REM PT RETURN 9FT ADLT (ELECTROSURGICAL) ×2
ELECTRODE REM PT RTRN 9FT ADLT (ELECTROSURGICAL) ×1 IMPLANT
EXTRT SYSTEM ALEXIS 17CM (MISCELLANEOUS)
GAUZE SPONGE 4X4 12PLY STRL (GAUZE/BANDAGES/DRESSINGS) ×2 IMPLANT
GAUZE SPONGE 4X4 12PLY STRL LF (GAUZE/BANDAGES/DRESSINGS) ×1 IMPLANT
GLOVE SURG ENC MOIS LTX SZ6.5 (GLOVE) ×2 IMPLANT
GLOVE SURG UNDER LTX SZ7 (GLOVE) ×2 IMPLANT
GOWN STRL REUS W/ TWL LRG LVL3 (GOWN DISPOSABLE) ×2 IMPLANT
GOWN STRL REUS W/TWL LRG LVL3 (GOWN DISPOSABLE) ×2
KIT TURNOVER KIT A (KITS) ×2 IMPLANT
MANIFOLD NEPTUNE II (INSTRUMENTS) ×2 IMPLANT
MAT PREVALON FULL STRYKER (MISCELLANEOUS) ×2 IMPLANT
NS IRRIG 1000ML POUR BTL (IV SOLUTION) ×2 IMPLANT
PACK C SECTION AR (MISCELLANEOUS) ×2 IMPLANT
PAD DRESSING TELFA 3X8 NADH (GAUZE/BANDAGES/DRESSINGS) ×1 IMPLANT
PAD OB MATERNITY 4.3X12.25 (PERSONAL CARE ITEMS) ×2 IMPLANT
PAD PREP 24X41 OB/GYN DISP (PERSONAL CARE ITEMS) ×2 IMPLANT
PENCIL SMOKE EVACUATOR (MISCELLANEOUS) ×2 IMPLANT
SCRUB CHG 4% DYNA-HEX 4OZ (MISCELLANEOUS) ×1 IMPLANT
SUT MNCRL AB 4-0 PS2 18 (SUTURE) ×2 IMPLANT
SUT PLAIN 2 0 XLH (SUTURE) IMPLANT
SUT VIC AB 0 CT1 36 (SUTURE) ×8 IMPLANT
SUT VIC AB 3-0 SH 27 (SUTURE) ×1
SUT VIC AB 3-0 SH 27X BRD (SUTURE) ×1 IMPLANT
SYSTEM CONTND EXTRCTN KII BLLN (MISCELLANEOUS) IMPLANT
WATER STERILE IRR 500ML POUR (IV SOLUTION) ×2 IMPLANT

## 2021-07-19 NOTE — Anesthesia Procedure Notes (Addendum)
Epidural Patient location during procedure: OB Start time: 07/19/2021 4:38 PM End time: 07/19/2021 5:00 PM  Staffing Anesthesiologist: Piscitello, Cleda Mccreedy, MD Resident/CRNA: Jeanine Luz, CRNA Performed: resident/CRNA   Preanesthetic Checklist Completed: patient identified, IV checked, site marked, risks and benefits discussed, surgical consent, monitors and equipment checked, pre-op evaluation and timeout performed  Epidural Patient position: sitting Prep: ChloraPrep Patient monitoring: heart rate, continuous pulse ox and blood pressure Approach: midline Location: L3-L4 Injection technique: LOR saline  Needle:  Needle type: Tuohy  Needle gauge: 17 G Needle length: 9 cm Needle insertion depth: 8.5 cm Catheter type: closed end flexible Catheter size: 19 Gauge Catheter at skin depth: 13 cm Test dose: negative and 1.5% lidocaine with Epi 1:200 K  Assessment Events: blood not aspirated, injection not painful, no injection resistance, no paresthesia and negative IV test  Additional Notes 3 attempt Pt. Evaluated and documentation done after procedure finished. Patient identified. Risks/Benefits/Options discussed with patient including but not limited to bleeding, infection, nerve damage, paralysis, failed block, incomplete pain control, headache, blood pressure changes, nausea, vomiting, reactions to medication both or allergic, itching and postpartum back pain. Confirmed with bedside nurse the patient's most recent platelet count. Confirmed with patient that they are not currently taking any anticoagulation, have any bleeding history or any family history of bleeding disorders. Patient expressed understanding and wished to proceed. All questions were answered. Sterile technique was used throughout the entire procedure. Please see nursing notes for vital signs. Test dose was given through epidural catheter and negative prior to continuing to dose epidural or start infusion. Warning  signs of high block given to the patient including shortness of breath, tingling/numbness in hands, complete motor block, or any concerning symptoms with instructions to call for help. Patient was given instructions on fall risk and not to get out of bed. All questions and concerns addressed with instructions to call with any issues or inadequate analgesia.   Patient tolerated the insertion well without immediate complications.Reason for block:procedure for pain

## 2021-07-19 NOTE — Anesthesia Preprocedure Evaluation (Addendum)
Anesthesia Evaluation  Patient identified by MRN, date of birth, ID band Patient awake    Reviewed: Allergy & Precautions, H&P , NPO status , Patient's Chart, lab work & pertinent test results  Airway Mallampati: II  TM Distance: >3 FB Neck ROM: full    Dental no notable dental hx.    Pulmonary neg pulmonary ROS, resolved, former smoker,    Pulmonary exam normal        Cardiovascular Normal cardiovascular exam+ Valvular Problems/Murmurs      Neuro/Psych  Headaches, negative psych ROS   GI/Hepatic Neg liver ROS, GERD  ,  Endo/Other  negative endocrine ROS  Renal/GU negative Renal ROS  negative genitourinary   Musculoskeletal   Abdominal   Peds  Hematology negative hematology ROS (+)   Anesthesia Other Findings TOLAC  Reproductive/Obstetrics (+) Pregnancy                            Anesthesia Physical Anesthesia Plan  ASA: 2  Anesthesia Plan: Epidural   Post-op Pain Management:    Induction:   PONV Risk Score and Plan:   Airway Management Planned: Natural Airway  Additional Equipment:   Intra-op Plan:   Post-operative Plan:   Informed Consent: I have reviewed the patients History and Physical, chart, labs and discussed the procedure including the risks, benefits and alternatives for the proposed anesthesia with the patient or authorized representative who has indicated his/her understanding and acceptance.     Dental Advisory Given  Plan Discussed with: Anesthesiologist and CRNA  Anesthesia Plan Comments: (Patient reports no bleeding problems and no anticoagulant use.   Patient consented for risks of anesthesia including but not limited to:  - adverse reactions to medications - risk of bleeding, infection and or nerve damage from epidural that could lead to paralysis - risk of headache or failed epidural - nerve damage due to positioning - that if epidural is used for  C-section that there is a chance of epidural failure requiring spinal placement or conversion to GA - Damage to heart, brain, lungs, other parts of body or loss of life  Patient voiced understanding.)       Anesthesia Quick Evaluation

## 2021-07-19 NOTE — Progress Notes (Signed)
Patient started saying that she was "hot" and began to say that she "can not have all this on me" and was removing all monitors, BP cuff, pulse oximetry monitor, and her gown. RN advised patient to remain calm and that it was important to keep the monitors on. RN began to place the BP cuff and monitors back on the patient and advised her of the importance that we check her vitals and fetal heart rate. Patient remained calm after placing monitors back on.

## 2021-07-19 NOTE — OB Triage Note (Signed)
Pt is 39.1 w G2P1 with hx previous C/S for breech presenting with painful UCs all day. Pt was seen yesterday and went home after 1 hr labor eval per pts request. +FM no LOF

## 2021-07-19 NOTE — Progress Notes (Signed)
Discussed lack of cervical change and fetal intolerance to labor with the patient. She verbalizes understanding and prefers to move forward with a c/section. Dr Valentino Saxon aware.  Tresea Mall, CNM

## 2021-07-19 NOTE — Progress Notes (Signed)
°  Labor Progress Note   26 y.o. G2P1001 @ [redacted]w[redacted]d , admitted for  Pregnancy, Labor Management. TOLAC  Subjective:  Comfortable with dose of stadol IV  Objective:  BP (!) 141/72 (BP Location: Left Arm)    Pulse 67    Temp 97.9 F (36.6 C) (Oral)    Resp 20    Ht 5\' 2"  (1.575 m)    Wt 86.1 kg    LMP 10/18/2020    BMI 34.72 kg/m  Abd: gravid, ND, FHT present, mild tenderness on exam Extr: no edema SVE: CERVIX: 3.5 cm dilated, 90 effaced, -2 station  EFM: FHR: 135 bpm, variability: moderate,  accelerations:  Present,  decelerations:  Absent Toco: Frequency: Every 2-4 minutes Labs: I have reviewed the patient's lab results.   Assessment & Plan:  G2P1001 @ [redacted]w[redacted]d, admitted for  Pregnancy and Labor/Delivery Management  1. Pain management:  IV analgesia . 2. FWB: FHT category I.  3. ID: GBS negative 4. Labor management: continue pitocin titration, consider AROM at next check  All discussed with patient, see orders   [redacted]w[redacted]d, CNM [facility] 07/19/2021  3:05 PM

## 2021-07-19 NOTE — Progress Notes (Signed)
OB Attending Physician Progress Note  Called by Rod Can, CNM due to non-reassuring fetal tracing after resumption of Pitocin. Carrie [name] is a 26 y.o. G50P1001 female at [redacted]w[redacted]d with h/o C/S x 1 (breech) desiring TOLAC.  Patient with prolonged deceleration, followed by repetitive lates and variables within 1 hour after epidural placement at ~ 5 pm.  Pitocin held at approximately 6:15 pm, patient placed in multiple position changes, terbutaline and IVF bolus given with eventual recovery to reassuring status.  Pitocin restarted at approximately 9 pm, however patient gain with prolonged deceleration. Pitocin held again after only reaching 4 mIU.  Patient remote from delivery, cervical exam with minimal change since admission this morning, 3.5/100/-2/intact. Noting that she desires to have a repeat C-section at this time.    The risks of surgery were discussed with the patient including but were not limited to: bleeding which may require transfusion or reoperation; infection which may require antibiotics; injury to bowel, bladder, ureters or other surrounding organs; injury to the fetus; need for additional procedures including hysterectomy in the event of a life-threatening hemorrhage; formation of adhesions; placental abnormalities with subsequent pregnancies; incisional problems; thromboembolic phenomenon and other postoperative/anesthesia complications.  The patient concurred with the proposed plan, giving informed written consent for the procedure.   Patient has been NPO since admission this morning except clears, she will remain NPO for procedure. Anesthesia and OR aware. Preoperative prophylactic antibiotics and SCDs ordered on call to the OR.  To OR when ready.   Rubie Maid, MD [facility]

## 2021-07-19 NOTE — Progress Notes (Signed)
Patient experiencing prolonged deceleration following epidural. In room with RNs- fluid bolus given, pitocin turned off, position changes. Good recovery in hands and knees position. Patient now in right lateral and still having contractions with late decelerations. Terbutaline given. Dr Valentino Saxon aware of tracing.  Tresea Mall, CNM

## 2021-07-19 NOTE — Progress Notes (Signed)
Pt off monitors x 1 hr to rest and then will recheck cervix

## 2021-07-19 NOTE — H&P (Addendum)
OB History & Physical   History of Present Illness:  Chief Complaint: contractions  HPI:  Carrie [name] is a 26 y.o. G2P1001 female at [redacted]w[redacted]d dated by LMP c/w 8w.  Her pregnancy has been complicated by history of cesarean section, marijuana use .    She reports contractions.   She denies leakage of fluid.   She denies vaginal bleeding.   She reports fetal movement.    Total weight gain for pregnancy: 25.9 kg   Obstetrical Problem List: pregnancy2  Problems (from 01/18/21 to present)     Problem Noted Resolved   Elevated glucose tolerance test 05/11/2021 by Imagene Riches, CNM No   Overview Addendum 06/04/2021  5:49 PM by Imagene Riches, CNM    12/13 notified of need for 3 hr testing Passed 3 hour testing.      Marijuana smoker 04/12/2021 by Imagene Riches, CNM No   Overview Addendum 05/10/2021 10:13 AM by Imagene Riches, CNM    Admits to MJ use this pregnancy. Counseled UDS ordered at 28 week lab draw      History of cesarean delivery affecting pregnancy 02/15/2021 by Will Bonnet, MD No   Nausea and vomiting during pregnancy 02/15/2021 by Will Bonnet, MD No   Supervision of normal pregnancy 01/20/2021 by Rod Can, CNM No   Overview Addendum 07/06/2021  3:04 PM by Imagene Riches, CNM     Nursing Staff Provider  Office Location  [facility] Dating   CW LMP  Language  English Anatomy US  Normal female  Flu Vaccine   Genetic Screen  NIPS: normal xy  TDaP vaccine   06/07/2020 Hgb A1C or  GTT Early : NA Third trimester : elevated but passed 3 hr test  Covid    LAB RESULTS   Rhogam  NA Blood Type A/Positive/-- (08/22 1628)   Feeding Plan Breast Antibody Negative (08/22 1628)  Contraception Nexplanon  Rubella 1.01 (08/22 1628)  Circumcision yes RPR Non Reactive (08/22 1628)   Pediatrician  [place]  HBsAg Negative (08/22 1628)   Support Person Derek HIV Non Reactive (08/22 1628)  Prenatal Classes multip Varicella Non-immune    GBS  (For PCN  allergy,negative  BTL Consent     VBAC Consent Primary June 2021 Pap  2020 negative    Hgb Electro   normal  Pelvis Tested  CF      SMA                   Maternal Medical History:   Past Medical History:  Diagnosis Date   Headache    Heart murmur    as a baby   Pneumonia    as a baby    Past Surgical History:  Procedure Laterality Date   CESAREAN SECTION N/A 11/12/2019   Procedure: CESAREAN SECTION;  Surgeon: Will Bonnet, MD;  Location: [facility] ORS;  Service: Obstetrics;  Laterality: N/A;   INCISION AND DRAINAGE PERIRECTAL ABSCESS N/A 01/21/2016   Procedure: IRRIGATION AND DEBRIDEMENT PERIRECTAL ABSCESS;  Surgeon: Jules Husbands, MD;  Location: [facility] ORS;  Service: General;  Laterality: N/A;    No Known Allergies  Prior to Admission medications   Medication Sig Start Date End Date Taking? Authorizing Provider  DICLEGIS 10-10 MG TBEC Take 2 tablets by mouth as directed. 2 at bedtime, may add 1 in the AM day 3 and 1 in the afternoon day 4 Patient not taking: Reported on 07/16/2021 02/15/21   Will Bonnet,  MD  Prenatal Vit-Fe Fumarate-FA (MULTIVITAMIN-PRENATAL) 27-0.8 MG TABS tablet Take 1 tablet by mouth daily at 12 noon.    [provider]    OB History  Gravida Para Term Preterm AB Living  2 1 1  0 0 1  SAB IAB Ectopic Multiple Live Births  0 0 0 0 1    # Outcome Date GA Lbr Len/2nd Weight Sex Delivery Anes PTL Lv  2 Current           1 Term 11/12/19 2851w0d  3100 g M CS-LTranv Spinal  LIV    Prenatal care site: [facility]  Social History: She  reports that she quit smoking about 7 months ago. Her smoking use included cigarettes. She smoked an average of .25 packs per day. She has never used smokeless tobacco. She reports that she does not currently use alcohol. She reports current drug use. Frequency: 14.00 times per week. Drug: Marijuana.  Family History: family history includes Cancer in her maternal aunt; Heart disease in her paternal  grandmother; Hypertension in her mother.    Review of Systems:  Review of Systems  Constitutional:  Negative for chills and fever.  HENT:  Negative for congestion, ear discharge, ear pain, hearing loss, sinus pain and sore throat.   Eyes:  Negative for blurred vision and double vision.  Respiratory:  Negative for cough, shortness of breath and wheezing.   Cardiovascular:  Negative for chest pain, palpitations and leg swelling.  Gastrointestinal:  Positive for abdominal pain. Negative for blood in stool, constipation, diarrhea, heartburn, melena, nausea and vomiting.  Genitourinary:  Negative for dysuria, flank pain, frequency, hematuria and urgency.  Musculoskeletal:  Negative for back pain, joint pain and myalgias.  Skin:  Negative for itching and rash.  Neurological:  Negative for dizziness, tingling, tremors, sensory change, speech change, focal weakness, seizures, loss of consciousness, weakness and headaches.  Endo/Heme/Allergies:  Negative for environmental allergies. Does not bruise/bleed easily.  Psychiatric/Behavioral:  Negative for depression, hallucinations, memory loss, substance abuse and suicidal ideas. The patient is not nervous/anxious and does not have insomnia.     Physical Exam:  BP 129/67 (BP Location: Left Arm)    Pulse 75    Temp 97.9 F (36.6 C) (Oral)    Resp 18    Ht 5\' 2"  (1.575 m)    Wt 86.1 kg    LMP 10/18/2020    BMI 34.72 kg/m   Constitutional: Well nourished, well developed female in no acute distress.  HEENT: normal Skin: Warm and dry.  Cardiovascular: Regular rate and rhythm.   Extremity:  no edema   Respiratory: Clear to auscultation bilateral. Normal respiratory effort Abdomen: FHT present Back: no CVAT Neuro: DTRs 2+, Cranial nerves grossly intact Psych: Alert and Oriented x3. No memory deficits. Normal mood and affect.    Pelvic exam: (female chaperone present) is not limited by body habitus EGBUS: within normal limits Vagina: within normal  limits and with normal mucosa  Cervix: 3/80/-3  Baseline FHR: 125 beats/min   Variability: moderate   Accelerations: present   Decelerations: absent Contractions: present frequency: every 1-6 Overall assessment: reassuring   Lab Results  Component Value Date   SARSCOV2NAA NEGATIVE 07/19/2021    Assessment:  Carrie [name] is a 26 y.o. 162P1001 female at 8073w1d admitted for labor/TOLAC.   Plan:  Admit to Labor & Delivery  CBC, T&S, Clrs, IVF GBS negative.   Fetal well-being: Category I Start pitocin  TOLAC: initiate protocol   Tresea MallJane Lexie Koehl, CNM  07/19/2021 10:05 AM

## 2021-07-20 ENCOUNTER — Encounter: Payer: Self-pay | Admitting: Advanced Practice Midwife

## 2021-07-20 DIAGNOSIS — O99324 Drug use complicating childbirth: Secondary | ICD-10-CM

## 2021-07-20 DIAGNOSIS — Z3A39 39 weeks gestation of pregnancy: Secondary | ICD-10-CM

## 2021-07-20 DIAGNOSIS — O34211 Maternal care for low transverse scar from previous cesarean delivery: Principal | ICD-10-CM

## 2021-07-20 DIAGNOSIS — O9903 Anemia complicating the puerperium: Secondary | ICD-10-CM

## 2021-07-20 DIAGNOSIS — F1210 Cannabis abuse, uncomplicated: Secondary | ICD-10-CM

## 2021-07-20 DIAGNOSIS — D62 Acute posthemorrhagic anemia: Secondary | ICD-10-CM

## 2021-07-20 LAB — CBC
HCT: 24.9 % — ABNORMAL LOW (ref 36.0–46.0)
Hemoglobin: 8.5 g/dL — ABNORMAL LOW (ref 12.0–15.0)
MCH: 30.4 pg (ref 26.0–34.0)
MCHC: 34.1 g/dL (ref 30.0–36.0)
MCV: 88.9 fL (ref 80.0–100.0)
Platelets: 235 10*3/uL (ref 150–400)
RBC: 2.8 MIL/uL — ABNORMAL LOW (ref 3.87–5.11)
RDW: 14.2 % (ref 11.5–15.5)
WBC: 14.3 10*3/uL — ABNORMAL HIGH (ref 4.0–10.5)
nRBC: 0 % (ref 0.0–0.2)

## 2021-07-20 MED ORDER — COCONUT OIL OIL
1.0000 "application " | TOPICAL_OIL | Status: DC | PRN
  Filled 2021-07-20: qty 120

## 2021-07-20 MED ORDER — MENTHOL 3 MG MT LOZG
1.0000 | LOZENGE | OROMUCOSAL | Status: DC | PRN
  Filled 2021-07-20: qty 9

## 2021-07-20 MED ORDER — KETOROLAC TROMETHAMINE 30 MG/ML IJ SOLN
30.0000 mg | Freq: Four times a day (QID) | INTRAMUSCULAR | Status: AC
  Administered 2021-07-20 (×3): 30 mg via INTRAVENOUS
  Filled 2021-07-20 (×4): qty 1

## 2021-07-20 MED ORDER — LACTATED RINGERS IV SOLN: INTRAVENOUS | Status: DC

## 2021-07-20 MED ORDER — NALOXONE HCL 0.4 MG/ML IJ SOLN: 0.4000 mg | INTRAMUSCULAR | Status: DC | PRN

## 2021-07-20 MED ORDER — DIPHENHYDRAMINE HCL 25 MG PO CAPS: 25.0000 mg | ORAL_CAPSULE | ORAL | Status: DC | PRN

## 2021-07-20 MED ORDER — VARICELLA VIRUS VACCINE LIVE 1350 PFU/0.5ML IJ SUSR
0.5000 mL | Freq: Once | INTRAMUSCULAR | Status: DC
  Filled 2021-07-20: qty 0.5

## 2021-07-20 MED ORDER — TRAMADOL HCL 50 MG PO TABS: 50.0000 mg | ORAL_TABLET | Freq: Four times a day (QID) | ORAL | Status: DC | PRN

## 2021-07-20 MED ORDER — SENNOSIDES-DOCUSATE SODIUM 8.6-50 MG PO TABS
2.0000 | ORAL_TABLET | Freq: Every day | ORAL | Status: DC
  Administered 2021-07-21: 2 via ORAL
  Filled 2021-07-20: qty 2

## 2021-07-20 MED ORDER — ZOLPIDEM TARTRATE 5 MG PO TABS: 5.0000 mg | ORAL_TABLET | Freq: Every evening | ORAL | Status: DC | PRN

## 2021-07-20 MED ORDER — PRENATAL MULTIVITAMIN CH
1.0000 | ORAL_TABLET | Freq: Every day | ORAL | Status: DC
  Administered 2021-07-20 – 2021-07-21 (×2): 1 via ORAL
  Filled 2021-07-20 (×2): qty 1

## 2021-07-20 MED ORDER — DIPHENHYDRAMINE HCL 25 MG PO CAPS: 25.0000 mg | ORAL_CAPSULE | Freq: Four times a day (QID) | ORAL | Status: DC | PRN

## 2021-07-20 MED ORDER — ACETAMINOPHEN 325 MG PO TABS
650.0000 mg | ORAL_TABLET | Freq: Four times a day (QID) | ORAL | Status: AC
  Administered 2021-07-20 (×4): 650 mg via ORAL
  Filled 2021-07-20 (×4): qty 2

## 2021-07-20 MED ORDER — FERROUS SULFATE 325 (65 FE) MG PO TABS
325.0000 mg | ORAL_TABLET | ORAL | Status: DC
  Administered 2021-07-21: 325 mg via ORAL
  Filled 2021-07-20: qty 1

## 2021-07-20 MED ORDER — GABAPENTIN 300 MG PO CAPS
300.0000 mg | ORAL_CAPSULE | Freq: Two times a day (BID) | ORAL | Status: DC
  Administered 2021-07-20 – 2021-07-21 (×4): 300 mg via ORAL
  Filled 2021-07-20 (×4): qty 1

## 2021-07-20 MED ORDER — KETOROLAC TROMETHAMINE 30 MG/ML IJ SOLN: 30.0000 mg | Freq: Four times a day (QID) | INTRAMUSCULAR | Status: DC

## 2021-07-20 MED ORDER — OXYCODONE-ACETAMINOPHEN 5-325 MG PO TABS: 1.0000 | ORAL_TABLET | Freq: Four times a day (QID) | ORAL | Status: DC | PRN

## 2021-07-20 MED ORDER — NALOXONE HCL 4 MG/10ML IJ SOLN
1.0000 ug/kg/h | INTRAVENOUS | Status: DC | PRN
  Filled 2021-07-20: qty 5

## 2021-07-20 MED ORDER — IBUPROFEN 600 MG PO TABS
600.0000 mg | ORAL_TABLET | Freq: Four times a day (QID) | ORAL | Status: DC
  Administered 2021-07-21 (×3): 600 mg via ORAL
  Filled 2021-07-20 (×3): qty 1

## 2021-07-20 MED ORDER — MEPERIDINE HCL 25 MG/ML IJ SOLN: 6.2500 mg | INTRAMUSCULAR | Status: DC | PRN

## 2021-07-20 MED ORDER — OXYCODONE HCL 5 MG PO TABS: 5.0000 mg | ORAL_TABLET | ORAL | Status: AC | PRN

## 2021-07-20 MED ORDER — HYDROMORPHONE HCL 1 MG/ML IJ SOLN: 1.0000 mg | INTRAMUSCULAR | Status: DC | PRN

## 2021-07-20 MED ORDER — OXYTOCIN-SODIUM CHLORIDE 30-0.9 UT/500ML-% IV SOLN
2.5000 [IU]/h | INTRAVENOUS | Status: AC
  Administered 2021-07-20 (×2): 2.5 [IU]/h via INTRAVENOUS
  Filled 2021-07-20: qty 500

## 2021-07-20 MED ORDER — SIMETHICONE 80 MG PO CHEW: 80.0000 mg | CHEWABLE_TABLET | ORAL | Status: DC | PRN

## 2021-07-20 MED ORDER — MAGNESIUM HYDROXIDE 400 MG/5ML PO SUSP: 30.0000 mL | ORAL | Status: DC | PRN

## 2021-07-20 MED ORDER — ONDANSETRON HCL 4 MG/2ML IJ SOLN: 4.0000 mg | Freq: Three times a day (TID) | INTRAMUSCULAR | Status: DC | PRN

## 2021-07-20 MED ORDER — WITCH HAZEL-GLYCERIN EX PADS: 1.0000 "application " | MEDICATED_PAD | CUTANEOUS | Status: DC | PRN

## 2021-07-20 MED ORDER — DIBUCAINE (PERIANAL) 1 % EX OINT: 1.0000 "application " | TOPICAL_OINTMENT | CUTANEOUS | Status: DC | PRN

## 2021-07-20 MED ORDER — SODIUM CHLORIDE 0.9% FLUSH: 3.0000 mL | INTRAVENOUS | Status: DC | PRN

## 2021-07-20 MED ORDER — DIPHENHYDRAMINE HCL 50 MG/ML IJ SOLN: 12.5000 mg | INTRAMUSCULAR | Status: DC | PRN

## 2021-07-20 NOTE — Progress Notes (Signed)
°  Subjective:  Doing well.  Feels ready to get up and move around.  Pain well managed with Toradol and PRN medication.  Breastfeeding is going well, able to latch infant, but infant is sleepy. Not passing flatus yet.  Breakfast tray just arrived.    Objective:  Blood pressure 115/64, pulse 93, temperature 98.8 F (37.1 C), temperature source Oral, resp. rate 20, height 5\' 2"  (1.575 m), weight 86.1 kg, last menstrual period 10/18/2020, SpO2 100 %, unknown if currently breastfeeding.  General: NAD Pulmonary: no increased work of breathing, CTAB Heart: RRR Breasts: soft, no masses, nipples erect and intact bilaterally Abdomen: non-distended, non-tender, fundus firm at level of umbilicus Incision: dressing dry and intact Extremities: no edema, no erythema, no tenderness Foley present, dark yellow urine   Results for orders placed or performed during the hospital encounter of 07/19/21 (from the past 24 hour(s))  CBC     Status: Abnormal   Collection Time: 07/20/21  5:47 AM  Result Value Ref Range   WBC 14.3 (H) 4.0 - 10.5 K/uL   RBC 2.80 (L) 3.87 - 5.11 MIL/uL   Hemoglobin 8.5 (L) 12.0 - 15.0 g/dL   HCT 07/22/21 (L) 78.2 - 95.6 %   MCV 88.9 80.0 - 100.0 fL   MCH 30.4 26.0 - 34.0 pg   MCHC 34.1 30.0 - 36.0 g/dL   RDW 21.3 08.6 - 57.8 %   Platelets 235 150 - 400 K/uL   nRBC 0.0 0.0 - 0.2 %   @I /O24@   Assessment:   26 y.o. 11-23-1981 postoperativeday # nearly 12 hours PP   Plan:  1) Acute blood loss anemia - hemodynamically stable and asymptomatic - po ferrous sulfate  2) --/--/A POS (02/20 G2X5284) / 1.01 (08/22 1628) / Varicella Non immune  3) TDAP status, declined  5) Disposition, discharge PPD2  1324, CNM  05-09-1971, [facility]  @TODAY @  9:37 AM

## 2021-07-20 NOTE — Op Note (Addendum)
Cesarean Section Procedure Note  Indications: failure to progress: arrest of dilation, non-reassuring fetal status, and previous uterine incision low-transverse x 1  Pre-operative Diagnosis: 39 week 2 day pregnancy, history of prior C-section x 1, desiring TOLAC.  Failure to progress, non-reassuring fetal status.  Post-operative Diagnosis: same  Surgeon: Hildred Laser, MD  Assistants:  Tresea Mall, CNM.  An experienced assistant was required given the standard of surgical care given the complexity of the case.  This assistant was needed for exposure, dissection, suctioning, retraction, instrument exchange, and for overall help during the procedure.  Procedure: Repeat low transverse Cesarean Section  Anesthesia: Epidural anesthesia  Findings: Female infant, cephalic presentation, 2920 grams, with Apgar scores of 8 at one minute and 9 at five minutes. Intact placenta with 3 vessel cord.  Nuchal cord noted after delivery of fetal head, reducible. Clear amniotic fluid at amniotomy The uterine outline, tubes and ovaries appeared normal.   Procedure Details: The patient was seen in the Holding Room. The risks, benefits, complications, treatment options, and expected outcomes were discussed with the patient.  The patient concurred with the proposed plan, giving informed consent.  The site of surgery properly noted/marked. The patient was taken to the Operating Room, identified as Carrie [name] and the procedure verified as C-Section Delivery. A Time Out was held and the above information confirmed.  After induction of anesthesia, the patient was draped and prepped in the usual sterile manner. Anesthesia was tested and noted to be adequate. A Pfannenstiel incision was made and carried down through the subcutaneous tissue to the fascia. Fascial incision was made and extended transversely. The fascia was separated from the underlying rectus tissue superiorly and inferiorly. The peritoneum was  identified and entered. Peritoneal incision was extended longitudinally. The surgical assist was able to provide retraction to allow for clear visualization of surgical site. Thin adhesions of the lower uterine segment to the peritoneum were encountered. The adhesions were lysed using the bovie. The utero-vesical peritoneal reflection was incised transversely and the bladder flap was bluntly freed from the lower uterine segment. A low transverse uterine incision was made. Delivered from cephalic presentation was a 2920 gram Female with Apgar scores of 8 at one minute and 9 at five minutes.  A nuchal cord was noted after delivery of the fetal head, reduced. The assistant was able to apply adequate fundal pressure to allow for successful delivery of the fetus.   The umbilical cord was clamped and cut and the infant was handed off to the Neonatal Transition Team. Delayed cord clamping was observed. The placenta was removed intact and appeared normal. The uterus was exteriorized and cleared of all clots and debris. The uterine outline, tubes and ovaries appeared normal.  The uterine incision was closed with running locked sutures of 0-Vicryl.  A second suture of 0-Vicryl was used in an imbricating layer.  Hemostasis was observed. The uterus was then returned to the abdomen. The pericolic gutters were cleared of all clots and debris. The fascia was then grasped with Carrie [name] and Carrie [name] clamps, and injected with a total of 60 ml of 1.3% Exparel solution (20 ml of bupivicaine liposomal mixed with 30 ml of 0.5% Marcaine and diluted in 50 ml of normal saline).  The fascia was then reapproximated with a running suture of 0-Vicryl. The subcutaneous fat layer was reapproximated with 2-0 Vicryl. The skin was reapproximated with 4-0 Monocryl. The skin and subcutaneous tissues were then injected with an additional 40 ml of the Exparel solution.  The incision was covered with steri-strips and a pressure dressing.   Instrument, sponge,  and needle counts were correct prior the abdominal closure and at the conclusion of the case.    Estimated Blood Loss:  475 ml      Drains: foley catheter to gravity drainage, 260 ml of clear urine at end of the procedure         Total IV Fluids:   Specimens: None         Implants: None         Complications:  None; patient tolerated the procedure well.         Disposition: PACU - hemodynamically stable.         Condition: stable   Hildred Laser, MD [facility]

## 2021-07-20 NOTE — Lactation Note (Signed)
This note was copied from a baby's chart. Lactation Consultation Note  Patient Name: Carrie [name] Date: 07/20/2021 Reason for consult: Initial assessment Age:25 hours  Maternal Data Has patient been taught Hand Expression?: No Does the patient have breastfeeding experience prior to this delivery?: Yes (Did not BF long, pumped for about a month before stopping.) How long did the patient breastfeed?:  (" a couple of days")  Feeding Mother's Current Feeding Choice: Breast Milk    Breasts feel normal and colostrum is very easily expressed. Baby was not hungry and was sleepy while attempting to latch at the breast in football hold. Attempted once. Went over benefits of skin to skin and placed baby on mother's chest after attempting to latch.   Went over feeding 8 or more times in a 24 hour period as mother would like to breastfeed, hunger cues and to place baby in bassinet if both parents would like to rest, and to anticipate a cluster feeding pattern around 24 hours. Provided lactation phone number if needing assistance for latch attempt #2.       Shaneequa Bahner Enzo Montgomery, Lactation Student  07/20/2021, 11:26 AM

## 2021-07-20 NOTE — Transfer of Care (Signed)
Immediate Anesthesia Transfer of Care Note  Patient: Carrie [name]  Procedure(s) Performed: CESAREAN SECTION  Patient Location: PACU  Anesthesia Type:Epidural  Level of Consciousness: awake, alert , oriented and patient cooperative  Airway & Oxygen Therapy: Patient Spontanous Breathing  Post-op Assessment: Post -op Vital signs reviewed and stable  Post vital signs: Reviewed and stable  Last Vitals:  Vitals Value Taken Time  BP 107/79 07/20/21 0014  Temp    Pulse 98 07/20/21 0015  Resp    SpO2 100 % 07/20/21 0015  Vitals shown include unvalidated device data.  Last Pain:  Vitals:   07/19/21 1915  TempSrc: Oral  PainSc: 0-No pain      Patients Stated Pain Goal: 0 (07/19/21 0947)  Complications: No notable events documented.

## 2021-07-20 NOTE — Anesthesia Post-op Follow-up Note (Signed)
°  Anesthesia Pain Follow-up Note  Patient: Carrie [name]  Day #: 1  Date of Follow-up: 07/20/2021 Time: 8:56 AM  Last Vitals:  Vitals:   07/20/21 0415 07/20/21 0813  BP:  115/64  Pulse:  93  Resp:  20  Temp:  37.1 C  SpO2: 99% 98%    Level of Consciousness: alert  Pain: none   Side Effects:None  Catheter Site Exam:clean, dry, no drainage     Plan: D/C from anesthesia care at surgeon's request  [name]

## 2021-07-20 NOTE — Anesthesia Postprocedure Evaluation (Signed)
Anesthesia Post Note  Patient: Carrie [name]  Procedure(s) Performed: [place]  Patient location during evaluation: Mother Baby Anesthesia Type: Epidural Level of consciousness: awake and alert Pain management: pain level controlled Vital Signs Assessment: post-procedure vital signs reviewed and stable Respiratory status: spontaneous breathing, nonlabored ventilation and respiratory function stable Cardiovascular status: stable Postop Assessment: no headache, no backache and epidural receding Anesthetic complications: no   No notable events documented.   Last Vitals:  Vitals:   07/20/21 0415 07/20/21 0813  BP:  115/64  Pulse:  93  Resp:  20  Temp:  37.1 C  SpO2: 99% 98%    Last Pain:  Vitals:   07/20/21 0813  TempSrc: Oral  PainSc:                  Hedda Slade

## 2021-07-21 MED ORDER — IBUPROFEN 600 MG PO TABS: 600.0000 mg | ORAL_TABLET | Freq: Four times a day (QID) | ORAL | 0 refills | Status: DC

## 2021-07-21 MED ORDER — OXYCODONE HCL 5 MG PO TABS: 5.0000 mg | ORAL_TABLET | ORAL | 0 refills | Status: DC | PRN

## 2021-07-21 NOTE — Lactation Note (Signed)
This note was copied from a baby's chart. Lactation Consultation Note  Patient Name: Boy Shantera Monts [id number] Date: 07/21/2021 Reason for consult: Follow-up assessment;1st time breastfeeding;Term;Other (Comment) (c-section) Age:25 hours  Lactation follow-up prior to anticipated discharge today.  Maternal Data Has patient been taught Hand Expression?: Yes Does the patient have breastfeeding experience prior to this delivery?: Yes How long did the patient breastfeed?: tried  Feeding Mother's Current Feeding Choice: Breast Milk Mom did not breastfeed her first child, however is doing well with breastfeeding this baby.  LATCH Score                    Lactation Tools Discussed/Used   Mom had questions re: pumping once she returned to work- guidance given, questions answered, milk storage guidelines reviewed.  Interventions Interventions: Breast feeding basics reviewed;Hand express;Education Reviewed upcoming breastfeeding guidance, emptying breast frequently, milk supply and demand, use of support pillows while feeding, hand expression to encourage deep latch, skin to skin in bonding and milk production. Guidance given for anticipated breast changes and management of breast fullness, and outpatient services available.  Discharge Discharge Education: Engorgement and breast care;Outpatient recommendation  Consult Status Consult Status: Complete    Danford Bad 07/21/2021, 9:50 AM

## 2021-07-21 NOTE — Discharge Summary (Signed)
Postpartum Discharge Summary  Date of Service updated 07/21/2021     Patient Name: Carrie [name] DOB: 1995-12-06 MRN: [id number]  Date of admission: 07/19/2021 Delivery date:07/19/2021  Delivering provider: Rubie Maid  Date of discharge: 07/21/2021  Admitting diagnosis: Term pregnancy [Z34.90] Labor and delivery, indication for care [O75.9] Intrauterine pregnancy: [redacted]w[redacted]d    Secondary diagnosis:  Active Problems:   Labor and delivery, indication for care   Postpartum care following cesarean delivery  Additional problems: none    Discharge diagnosis: Term Pregnancy Delivered                                              Post partum procedures: none Augmentation: Pitocin Complications: None  Hospital course: Onset of Labor With Unplanned C/S   26y.o. yo [id number] admitted in Latent Labor on 07/19/2021. Patient had a labor course significant for failure to progress ( VBAC attempt). The patient went for cesarean section due to Arrest of Dilation. Delivery details as follows: Membrane Rupture Time/Date: 11:17 PM ,07/19/2021   Delivery Method:C-Section, Low Transverse  Details of operation can be found in separate operative note. Patient had an uncomplicated postpartum course.  She is ambulating,tolerating a regular diet, passing flatus, and urinating well.  Patient is discharged home in stable condition 07/21/21.  Newborn Data: Birth date:07/19/2021  Birth time:11:18 PM  Gender:Female  Living status:Living  Apgars:8 ,9  Weight:2920 g   Magnesium Sulfate received: No BMZ received: No Rhophylac:N/A MMR:N/A T-DaP:Given prenatally Flu: N/A Transfusion:No  Physical exam  Vitals:   07/20/21 2200 07/20/21 2345 07/20/21 2355 07/21/21 0820  BP: 133/78  121/75 128/74  Pulse: 90  87 85  Resp:   18 18  Temp:   98.3 F (36.8 C) 98.2 F (36.8 C)  TempSrc:    Oral  SpO2: 99% 100% 99% 100%  Weight:      Height:       General: alert, cooperative, and no  distress Lochia: appropriate Uterine Fundus: firm Incision: Healing well with no significant drainage, Dressing is clean, dry, and intact DVT Evaluation: No evidence of DVT seen on physical exam. Negative Homan's sign. No cords or calf tenderness. Labs: Lab Results  Component Value Date   WBC 14.3 (H) 07/20/2021   HGB 8.5 (L) 07/20/2021   HCT 24.9 (L) 07/20/2021   MCV 88.9 07/20/2021   PLT 235 07/20/2021   CMP Latest Ref Rng & Units 05/11/2021  Glucose 70 - 99 mg/dL 71  BUN 6 - 20 mg/dL <5(L)  Creatinine 0.44 - 1.00 mg/dL 0.43(L)  Sodium 135 - 145 mmol/L 134(L)  Potassium 3.5 - 5.1 mmol/L 3.5  Chloride 98 - 111 mmol/L 106  CO2 22 - 32 mmol/L 23  Calcium 8.9 - 10.3 mg/dL 8.4(L)  Total Protein 6.5 - 8.1 g/dL 6.5  Total Bilirubin 0.3 - 1.2 mg/dL 0.5  Alkaline Phos 38 - 126 U/L 125  AST 15 - 41 U/L 19  ALT 0 - 44 U/L 18   Edinburgh Score: Edinburgh Postnatal Depression Scale Screening Tool 07/20/2021  I have been able to laugh and see the funny side of things. 0  I have looked forward with enjoyment to things. 1  I have blamed myself unnecessarily when things went wrong. 1  I have been anxious or worried for no good reason. 3  I have felt  scared or panicky for no good reason. 1  Things have been getting on top of me. 0  I have been so unhappy that I have had difficulty sleeping. 0  I have felt sad or miserable. 1  I have been so unhappy that I have been crying. 0  The thought of harming myself has occurred to me. 0  Edinburgh Postnatal Depression Scale Total 7      After visit meds:  Allergies as of 07/21/2021   No Known Allergies      Medication List     STOP taking these medications    Diclegis 10-10 MG Tbec Generic drug: Doxylamine-Pyridoxine       TAKE these medications    ibuprofen 600 MG tablet Commonly known as: ADVIL Take 1 tablet (600 mg total) by mouth every 6 (six) hours.   multivitamin-prenatal 27-0.8 MG Tabs tablet Take 1 tablet by mouth  daily at 12 noon.   oxyCODONE 5 MG immediate release tablet Commonly known as: Oxy IR/ROXICODONE Take 1 tablet (5 mg total) by mouth every 4 (four) hours as needed for breakthrough pain.               Discharge Care Instructions  (From admission, onward)           Start     Ordered   07/21/21 0000  Leave dressing on - Keep it clean, dry, and intact until clinic visit        07/21/21 1015             Discharge home in stable condition Infant Feeding: Breast Infant Disposition:home with mother Discharge instruction: per After Visit Summary and Postpartum booklet. Activity: Advance as tolerated. Pelvic rest for 6 weeks.  Diet: routine diet Anticipated Birth Control: IUD Postpartum Appointment:1 week Additional Postpartum F/U: Incision check 1 week Future Appointments: Future Appointments  Date Time Provider [place]  07/27/2021  2:35 PM Imagene Riches, CNM [facility] None   Follow up Visit:  [facility] Follow up in 1 week(s).   Why: Please keep an appointment at [facility] for a one week post CS visit. Contact information: [address] [place] [address] [phone]                     07/21/2021 Imagene Riches, CNM

## 2021-07-21 NOTE — TOC Initial Note (Addendum)
Transition of Care [facility]) - Initial/Assessment Note    Patient Details  Name: Carrie [name] MRN: [id number] Date of Birth: 05-May-1996  Transition of Care [facility] CM/SW Contact:    Anselm Pancoast, RN Phone Number: 07/21/2021, 3:01 PM  Clinical Narrative:                 Spoke with patient regarding (+) MJ screen. Patient reports she has strong support system and has no needs or concerns. Patient has all needed equipment including car seat. Patient is planning to breastfeed and has [facility] services. No transportation needs. Patient understands if infant (+) UDS will require CPS report. Discussed PPD and no history of anxiety/depression. Will follow cord blood and no other TOC needs at this time.   UDS (+)  for infant. CPS report made.         Patient Goals and CMS Choice        Expected Discharge Plan and Services           Expected Discharge Date: 07/21/21                                    Prior Living Arrangements/Services                       Activities of Daily Living Home Assistive Devices/Equipment: None ADL Screening (condition at time of admission) Patient's cognitive ability adequate to safely complete daily activities?: Yes Is the patient deaf or have difficulty hearing?: No Does the patient have difficulty seeing, even when wearing glasses/contacts?: No Does the patient have difficulty concentrating, remembering, or making decisions?: No Patient able to express need for assistance with ADLs?: Yes Does the patient have difficulty dressing or bathing?: No Independently performs ADLs?: Yes (appropriate for developmental age) Does the patient have difficulty walking or climbing stairs?: No Weakness of Legs: None Weakness of Arms/Hands: None  Permission Sought/Granted                  Emotional Assessment              Admission diagnosis:  Term pregnancy [Z34.90] Labor and delivery, indication for care [O75.9] Patient Active Problem  List   Diagnosis Date Noted   Postpartum care following cesarean delivery 07/21/2021   Labor and delivery, indication for care 07/19/2021   Elevated glucose tolerance test 05/11/2021   Marijuana smoker 04/12/2021   History of cesarean delivery affecting pregnancy 02/15/2021   Supervision of normal pregnancy 01/20/2021   Perirectal abscess    PCP:  [facility], [place]:   [place], [place] - [place] [address] [facility] [address] [place] [address] Phone: [phone] Fax: [phone]     Social Determinants of Health (SDOH) Interventions    Readmission Risk Interventions No flowsheet data found.

## 2021-07-23 ENCOUNTER — Encounter: Payer: Medicaid Other | Admitting: Licensed Practical Nurse

## 2021-07-27 ENCOUNTER — Telehealth: Payer: Self-pay | Admitting: Advanced Practice Midwife

## 2021-07-27 ENCOUNTER — Ambulatory Visit (INDEPENDENT_AMBULATORY_CARE_PROVIDER_SITE_OTHER): Payer: Medicaid Other | Admitting: Obstetrics

## 2021-07-27 ENCOUNTER — Encounter: Payer: Self-pay | Admitting: Obstetrics

## 2021-07-27 ENCOUNTER — Other Ambulatory Visit: Payer: Self-pay

## 2021-07-27 NOTE — Telephone Encounter (Signed)
PT is sch with JEG on April 08-2021 at 10:15 for Nexplanon Placement.

## 2021-07-27 NOTE — Progress Notes (Signed)
Obstetrics & Gynecology Office Visit   Chief Complaint:  Chief Complaint  Patient presents with   Postpartum Care    History of Present Illness: Carrie [name] returns to the office at one week poost repeat Cesarean section for an incision check. She labored until complete with a desire for VBAC, but was diagnosed as "FTP, and non reassuring fetal heart tones" and then taken for C section. Today she is smiling, reports she is doing well. She is pumping her breast meil and bottle feeding her son. She deneis any depressive symptoms and her incion is not bothring her.   Review of Systems:  Review of Systems  Constitutional: Negative.   HENT: Negative.    Eyes: Negative.   Respiratory: Negative.    Cardiovascular: Negative.   Gastrointestinal: Negative.   Genitourinary: Negative.   Skin: Negative.   All other systems reviewed and are negative.   Past Medical History:  Past Medical History:  Diagnosis Date   Headache    Heart murmur    as a baby   Pneumonia    as a baby    Past Surgical History:  Past Surgical History:  Procedure Laterality Date   CESAREAN SECTION N/A 11/12/2019   Procedure: CESAREAN SECTION;  Surgeon: Conard Novak, MD;  Location: [facility] ORS;  Service: Obstetrics;  Laterality: N/A;   CESAREAN SECTION  07/19/2021   Procedure: CESAREAN SECTION;  Surgeon: Hildred Laser, MD;  Location: [facility] ORS;  Service: Obstetrics;;   INCISION AND DRAINAGE PERIRECTAL ABSCESS N/A 01/21/2016   Procedure: IRRIGATION AND DEBRIDEMENT PERIRECTAL ABSCESS;  Surgeon: Leafy Ro, MD;  Location: [facility] ORS;  Service: General;  Laterality: N/A;    Gynecologic History: No LMP recorded.  Obstetric History: G2P2002  Family History:  Family History  Problem Relation Age of Onset   Hypertension Mother    Cancer Maternal Aunt    Heart disease Paternal Grandmother     Social History:  Social History   Socioeconomic History   Marital status: Single    Spouse name: Not on  file   Number of children: Not on file   Years of education: Not on file   Highest education level: Not on file  Occupational History   Occupation: Unemployed  Tobacco Use   Smoking status: Former    Packs/day: 0.25    Types: Cigarettes    Quit date: 11/26/2020    Years since quitting: 0.6   Smokeless tobacco: Never  Vaping Use   Vaping Use: Never used  Substance and Sexual Activity   Alcohol use: Not Currently    Comment: last use 11/28/20- liquor   Drug use: Yes    Frequency: 14.0 times per week    Types: Marijuana    Comment: daily, planning to stop   Sexual activity: Yes    Birth control/protection: None, Condom, Injection    Comment: undecided   Other Topics Concern   Not on file  Social History Narrative   Not on file   Social Determinants of Health   Financial Resource Strain: Not on file  Food Insecurity: Not on file  Transportation Needs: Not on file  Physical Activity: Not on file  Stress: Not on file  Social Connections: Not on file  Intimate Partner Violence: Not At Risk   Fear of Current or Ex-Partner: No   Emotionally Abused: No   Physically Abused: No   Sexually Abused: No    Allergies:  No Known Allergies  Medications: Prior to Admission medications  Medication Sig Start Date End Date Taking? Authorizing Provider  ibuprofen (ADVIL) 600 MG tablet Take 1 tablet (600 mg total) by mouth every 6 (six) hours. 07/21/21  Yes Mirna Mires, CNM  Prenatal Vit-Fe Fumarate-FA (MULTIVITAMIN-PRENATAL) 27-0.8 MG TABS tablet Take 1 tablet by mouth daily at 12 noon.   Yes [provider]  oxyCODONE (OXY IR/ROXICODONE) 5 MG immediate release tablet Take 1 tablet (5 mg total) by mouth every 4 (four) hours as needed for breakthrough pain. Patient not taking: Reported on 07/27/2021 07/21/21   Mirna Mires, CNM    Physical Exam Vitals:  Vitals:   07/27/21 1439  BP: 122/72   No LMP recorded.  Physical Exam Constitutional:      Appearance:  Normal appearance.  Cardiovascular:     Rate and Rhythm: Normal rate and regular rhythm.     Pulses: Normal pulses.     Heart sounds: Normal heart sounds.  Pulmonary:     Effort: Pulmonary effort is normal.     Breath sounds: Normal breath sounds.  Abdominal:     Palpations: Abdomen is soft.     Comments: Honeycomb dressing is in place- no drainage noted. Dressing removed today. Incision is clean and dry and intact. Steri strips removed.  Musculoskeletal:     Cervical back: Normal range of motion and neck supple.  Skin:    General: Skin is warm and dry.  Neurological:     Mental Status: She is alert.  Psychiatric:        Mood and Affect: Mood normal.        Behavior: Behavior normal.     Comments: Edinburgh score is 3     Assessment: 26 y.o. [id number] for one week post CS incsion check- doing well Dressing and steri strip removal.   Plan: Problem List Items Addressed This Visit       Other   Postpartum care following cesarean delivery - Primary  She will RTC in 5 weeks for her 6 week PP visit. She is interested in either the Nexplanon or an IUD. Discussed the methods, including pros and cons, and she is provided a booklet on other methods. Advised to clean her incion area gently, and a pack of steristrips are provided for PRN use.  Mirna Mires, CNM  07/27/2021 3:11 PM

## 2021-07-28 NOTE — Telephone Encounter (Signed)
Noted. Will order to arrive by apt date/time. 

## 2021-08-31 ENCOUNTER — Ambulatory Visit: Payer: Medicaid Other | Admitting: Advanced Practice Midwife

## 2021-09-01 ENCOUNTER — Other Ambulatory Visit (HOSPITAL_COMMUNITY)
Admission: RE | Admit: 2021-09-01 | Discharge: 2021-09-01 | Disposition: A | Discharge status: Home / Self Care | Payer: Medicaid Other | Source: Ambulatory Visit | Attending: Advanced Practice Midwife | Admitting: Advanced Practice Midwife

## 2021-09-01 ENCOUNTER — Encounter: Payer: Self-pay | Admitting: Advanced Practice Midwife

## 2021-09-01 ENCOUNTER — Ambulatory Visit (INDEPENDENT_AMBULATORY_CARE_PROVIDER_SITE_OTHER): Payer: Medicaid Other | Admitting: Advanced Practice Midwife

## 2021-09-01 DIAGNOSIS — Z124 Encounter for screening for malignant neoplasm of cervix: Secondary | ICD-10-CM | POA: Insufficient documentation

## 2021-09-01 DIAGNOSIS — Z30017 Encounter for initial prescription of implantable subdermal contraceptive: Secondary | ICD-10-CM

## 2021-09-01 NOTE — Addendum Note (Signed)
Addended by: Rod Can on: 09/01/2021 02:12 PM ? ? Modules accepted: Orders ? ?

## 2021-09-01 NOTE — Progress Notes (Signed)
? ? ? ?Postpartum Visit  ?Chief Complaint:  ?Chief Complaint  ?Patient presents with  ? Postpartum Care  ? Contraception  ?  Nexplanon insertion  ? ? ?History of Present Illness: Patient is a 26 y.o. [id number] presents for postpartum visit. ? ?Review the Delivery Report for details.  ? ?Date of delivery: 07/20/2021 ?Type of delivery: cesarean section/arrest of dilation ?Episiotomy No.  ?Laceration: no  ?Pregnancy or labor problems:   ?Any problems since the delivery:  no ? ?Newborn Details:  ?SINGLETON :  ?1. BabyGender female. Birth weight: 6 pounds 7 ounces ?Maternal Details:  ?Breast or formula feeding: currently formula feeding ?Intercourse: No  ?Contraception after delivery:  Nexplanon today ?Any bowel or bladder issues: No  ?Post partum depression/anxiety noted:  no ?Edinburgh Post-Partum Depression Score: 4 ?Date of last PAP: 03/28/2019  no abnormalities  ? ?Review of Systems: Review of Systems  ?Constitutional:  Negative for chills and fever.  ?HENT:  Negative for congestion, ear discharge, ear pain, hearing loss, sinus pain and sore throat.   ?Eyes:  Negative for blurred vision and double vision.  ?Respiratory:  Negative for cough, shortness of breath and wheezing.   ?Cardiovascular:  Negative for chest pain, palpitations and leg swelling.  ?Gastrointestinal:  Negative for abdominal pain, blood in stool, constipation, diarrhea, heartburn, melena, nausea and vomiting.  ?Genitourinary:  Negative for dysuria, flank pain, frequency, hematuria and urgency.  ?Musculoskeletal:  Negative for back pain, joint pain and myalgias.  ?Skin:  Negative for itching and rash.  ?Neurological:  Negative for dizziness, tingling, tremors, sensory change, speech change, focal weakness, seizures, loss of consciousness, weakness and headaches.  ?Endo/Heme/Allergies:  Negative for environmental allergies. Does not bruise/bleed easily.  ?Psychiatric/Behavioral:  Negative for depression, hallucinations, memory loss, substance abuse and  suicidal ideas. The patient is not nervous/anxious and does not have insomnia.   ? ? ? ?Past Medical History:  ?Past Medical History:  ?Diagnosis Date  ? Elevated glucose tolerance test 05/11/2021  ? 12/13 notified of need for 3 hr testing Passed 3 hour testing.  ? Headache   ? Heart murmur   ? as a baby  ? History of cesarean delivery affecting pregnancy 02/15/2021  ? Labor and delivery, indication for care 07/19/2021  ? Pneumonia   ? as a baby  ? Supervision of normal pregnancy 01/20/2021  ?  Nursing Staff Provider Office Location  [facility] Dating   CW LMP Language  English Anatomy US  Normal female Flu Vaccine   Genetic Screen  NIPS: normal xy TDaP vaccine   06/07/2020 Hgb A1C or  GTT Early : NA Third trimester : elevated but passed 3 hr test Covid    LAB RESULTS  Rhogam  NA Blood Type A/Positive/-- (08/22 1628)  Feeding Plan Breast Antibody Negative (08/22 1628) Contraception Nexplanon   ? ? ?Past Surgical History:  ?Past Surgical History:  ?Procedure Laterality Date  ? CESAREAN SECTION N/A 11/12/2019  ? Procedure: CESAREAN SECTION;  Surgeon: Conard Novak, MD;  Location: [facility];  Service: Obstetrics;  Laterality: N/A;  ? CESAREAN SECTION  07/19/2021  ? Procedure: CESAREAN SECTION;  Surgeon: Hildred Laser, MD;  Location: [facility];  Service: Obstetrics;;  ? INCISION AND DRAINAGE PERIRECTAL ABSCESS N/A 01/21/2016  ? Procedure: IRRIGATION AND DEBRIDEMENT PERIRECTAL ABSCESS;  Surgeon: Leafy Ro, MD;  Location: [facility];  Service: General;  Laterality: N/A;  ? ? ?Family History:  ?Family History  ?Problem Relation Age of Onset  ? Hypertension Mother   ? Heart  disease Paternal Grandmother   ? ? ?Social History:  ?Social History  ? ?Socioeconomic History  ? Marital status: Single  ?  Spouse name: Not on file  ? Number of children: Not on file  ? Years of education: Not on file  ? Highest education level: Not on file  ?Occupational History  ? Occupation: Unemployed  ?Tobacco Use  ? Smoking status: Former  ?  Packs/day:  0.25  ?  Types: Cigarettes  ?  Quit date: 11/26/2020  ?  Years since quitting: 0.7  ? Smokeless tobacco: Never  ?Vaping Use  ? Vaping Use: Never used  ?Substance and Sexual Activity  ? Alcohol use: Not Currently  ?  Comment: last use 11/28/20- liquor  ? Drug use: Yes  ?  Frequency: 14.0 times per week  ?  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 Sexual activity: Not Currently  ?  Birth control/protection: None  ?Other Topics Concern  ? Not on file  ?Social History Narrative  ? Not on file  ? ?Social Determinants of Health  ? ?Financial Resource Strain: Not on file  ?Food Insecurity: Not on file  ?Transportation Needs: Not on file  ?Physical Activity: Not on file  ?Stress: Not on file  ?Social Connections: Not on file  ?Intimate Partner Violence: Not At Risk  ? Fear of Current or Ex-Partner: No  ? Emotionally Abused: No  ? Physically Abused: No  ? Sexually Abused: No  ? ? ?Allergies:  ?No Known Allergies ? ?Medications: ?Prior to Admission medications   ?Medication Sig Start Date End Date Taking? Authorizing Provider  ?Prenatal Vit-Fe Fumarate-FA (MULTIVITAMIN-PRENATAL) 27-0.8 MG TABS tablet Take 1 tablet by mouth daily at 12 noon.   Yes [provider]  ? ? ?Physical Exam ?Blood pressure 104/70, height 5\' 2"  (1.575 m), weight 159 lb (72.1 kg), last menstrual period 08/24/2021, not currently breastfeeding. ?  ? ?General: NAD ?HEENT: normocephalic, anicteric ?Pulmonary: No increased work of breathing ?Abdomen: NABS, soft, non-tender, non-distended.  Umbilicus without lesions.  No hepatomegaly, splenomegaly or masses palpable. No evidence of hernia. ?Genitourinary: ? External: Normal external female genitalia.  Normal urethral meatus, normal  Bartholin's and Skene's glands.   ? Vagina: Normal vaginal mucosa, no evidence of prolapse.   ? Cervix: Grossly normal in appearance, no bleeding ? Uterus: Non-enlarged, mobile, normal contour.  No CMT ? Adnexa: ovaries non-enlarged, no adnexal  masses ? Rectal: deferred ?Extremities: no edema, erythema, or tenderness ?Neurologic: Grossly intact ?Psychiatric: mood appropriate, affect full ? ? Edinburgh Postnatal Depression Scale - 09/01/21 1030   ? ?  ? Edinburgh Postnatal Depression Scale:  In the Past 7 Days  ? I have been able to laugh and see the funny side of things. 0   ? I have looked forward with enjoyment to things. 1   ? I have blamed myself unnecessarily when things went wrong. 1   ? I have been anxious or worried for no good reason. 1   ? I have felt scared or panicky for no good reason. 0   ? Things have been getting on top of me. 1   ? I have been so unhappy that I have had difficulty sleeping. 0   ? I have felt sad or miserable. 0   ? I have been so unhappy that I have been crying. 0   ? The thought of harming myself has occurred to me. 0   ? Edinburgh Postnatal Depression Scale Total  4   ? ?  ?  ? ?  ? ? ?Assessment: 26 y.o. [id number] presenting for 6 week postpartum visit ? ?Plan: ?Problem List Items Addressed This Visit   ?None ?Visit Diagnoses   ? ? 6 weeks postpartum follow-up    -  Primary  ? Nexplanon insertion      ? ?  ? ? ? ?1) Contraception ?- Education given regarding options for contraception, as well as compatibility with breast feeding if applicable.  Patient plans on Nexplanon for contraception. ? ?2)  Pap ?- ASCCP guidelines and rationale discussed.  Patient opts for every 3 years screening interval ? ?3) Patient underwent screening for postpartum depression with no signs of depression ? ?4) Return in about 1 year (around 09/02/2022) for annual established gyn. ? ? ?Tresea MallJane Regina Coppolino, CNM ?[facility] ?[facility] ?09/01/2021, 1:25 PM ? ? ?  ? ? ? ?GYNECOLOGY PROCEDURE NOTE ? ?Patient is a 26 y.o. [id number] presenting for Nexplanon insertion as her desired means of contraception.  She provided informed consent, signed copy in the chart, time out was performed. Self reported LMP of Patient's last menstrual period was  08/24/2021 (exact date). ? ?She understands that Nexplanon is a progesterone only therapy, and that often patients have irregular and unpredictable vaginal bleeding or amenorrhea. She understands that other side effect

## 2021-09-07 LAB — CYTOLOGY - PAP
Diagnosis: NEGATIVE
Diagnosis: REACTIVE

## 2021-09-10 NOTE — Telephone Encounter (Signed)
Nexplanon rcvd/charged 09/01/2021 ?

## 2021-10-15 IMAGING — US US OB < 14 WKS - US OB TV - US DOPPLER
1 series · 14 of 28 positions shown · non-contrast
Comparison: None.

CLINICAL DATA: Pregnant, pelvic pain



[Series 1: us ob less than 14 weeks w/ ob transvaginal and do · 14 of 131 slices shown]
[im 5/131]
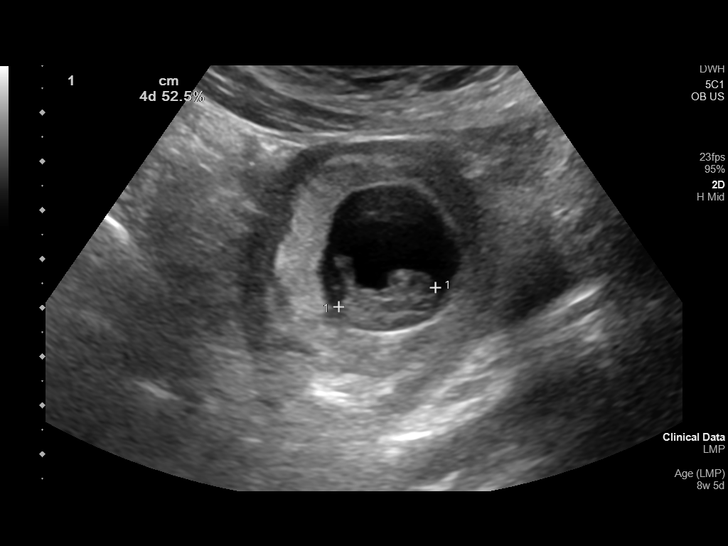
[im 15/131]
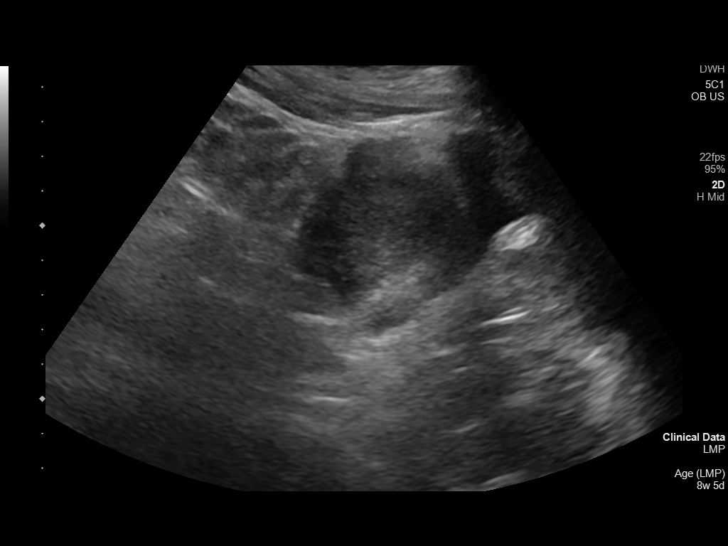
[im 25/131]
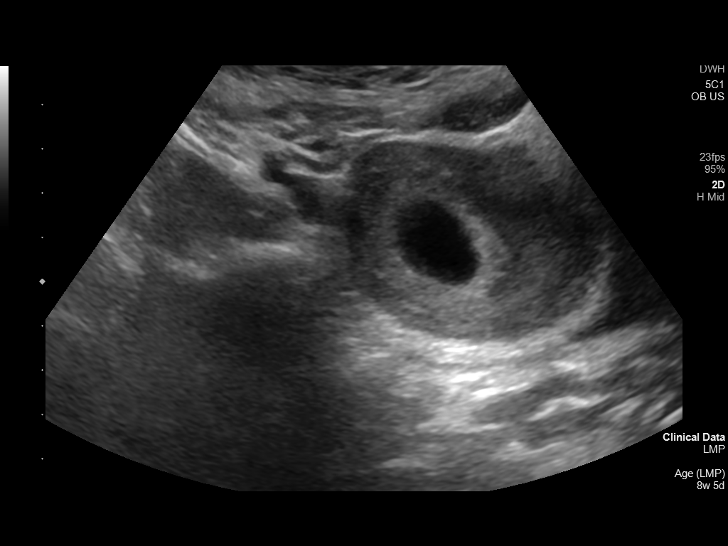
[im 34/131]
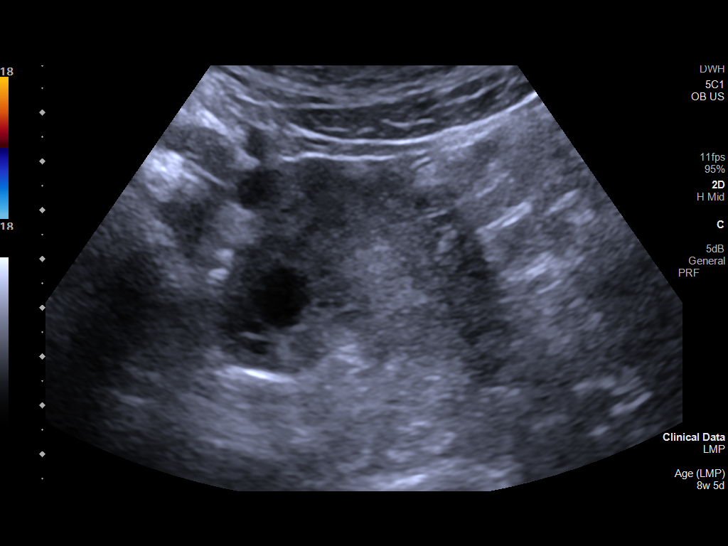
[im 44/131]
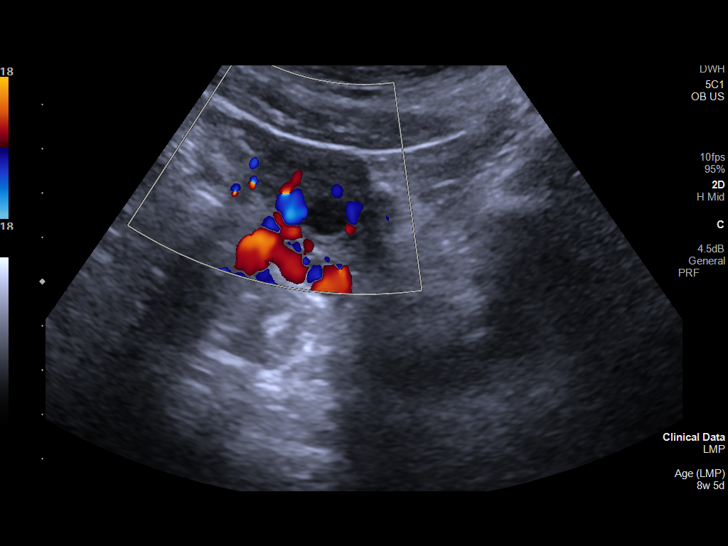
[im 53/131]
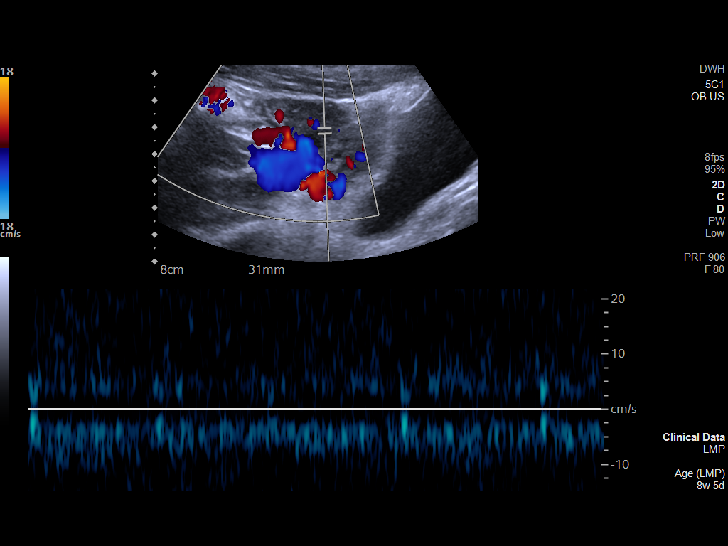
[im 63/131]
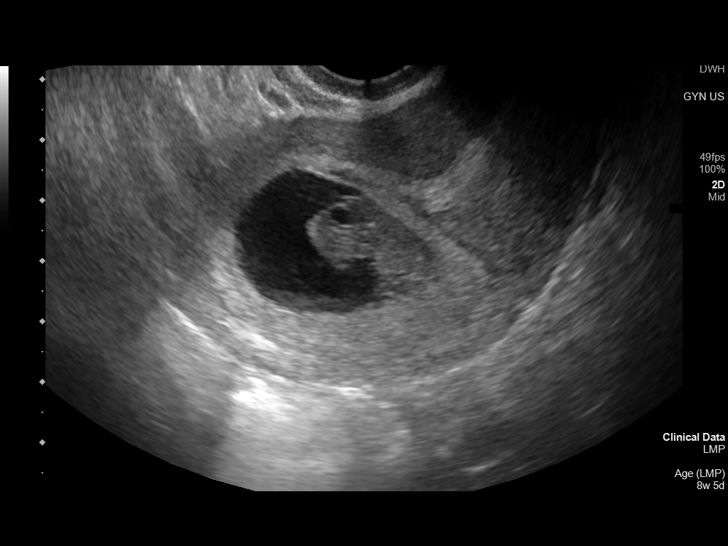
[im 73/131]
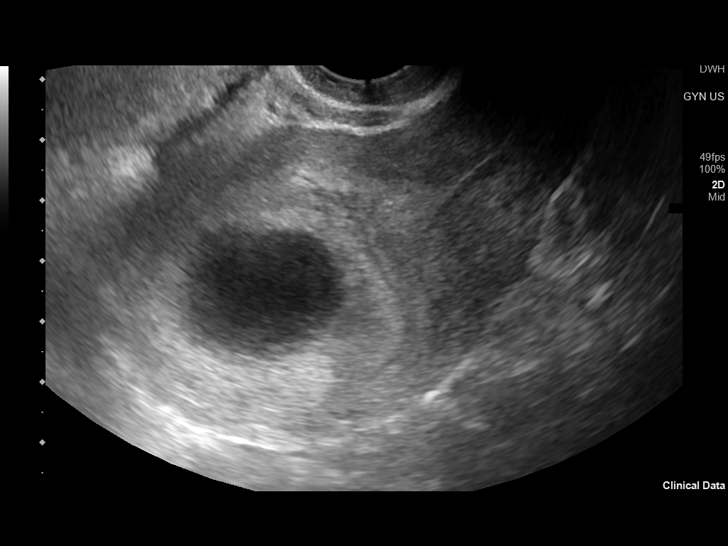
[im 82/131]
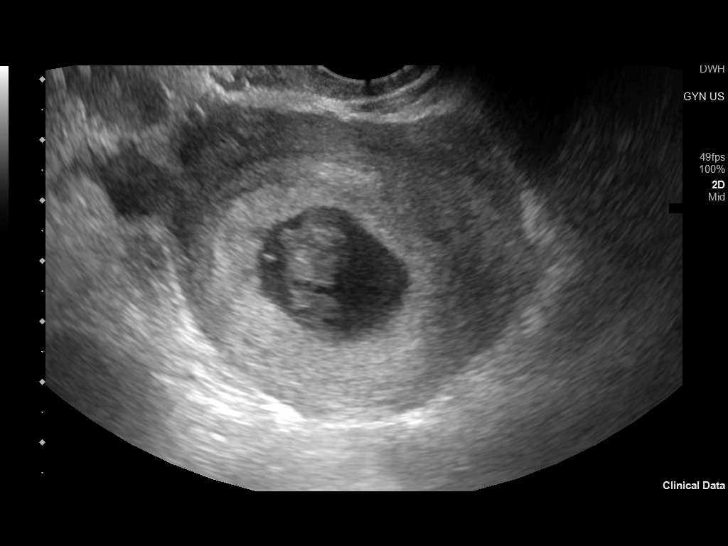
[im 92/131]
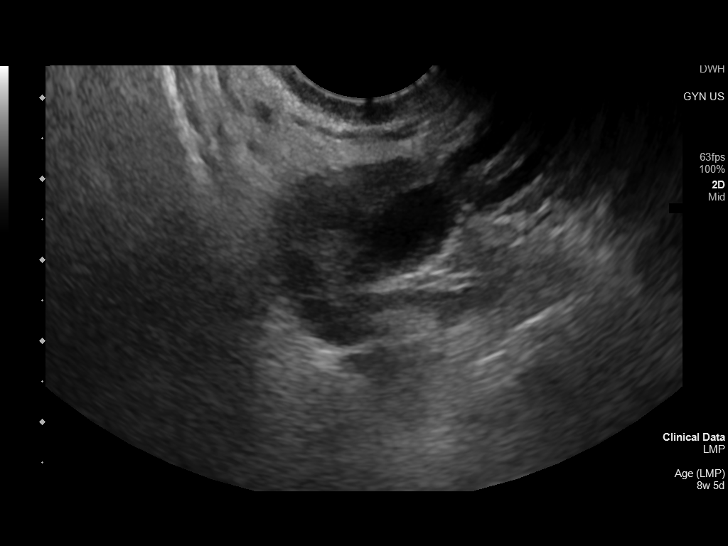
[im 102/131]
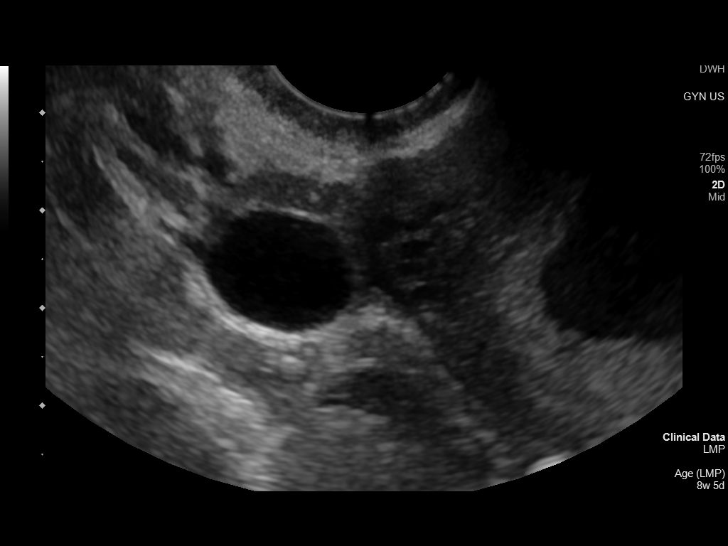
[im 111/131]
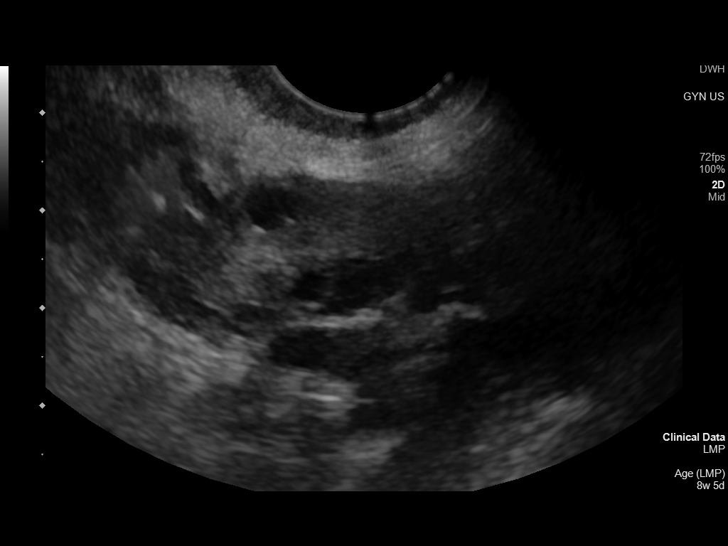
[im 121/131]
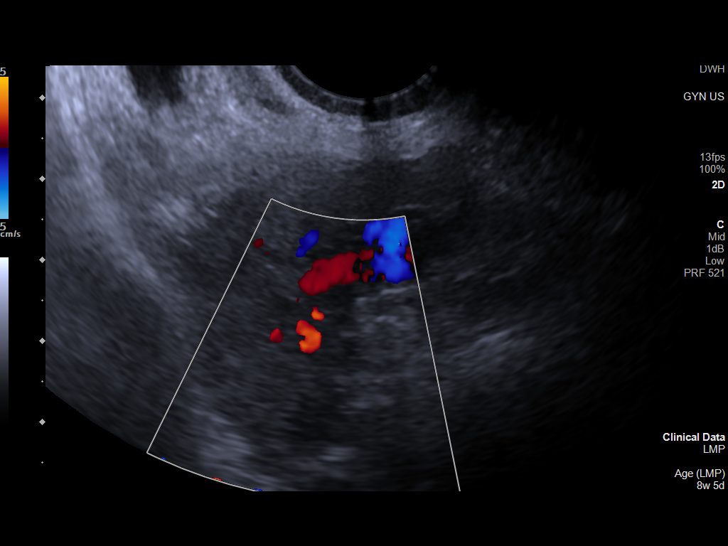
[im 131/131]
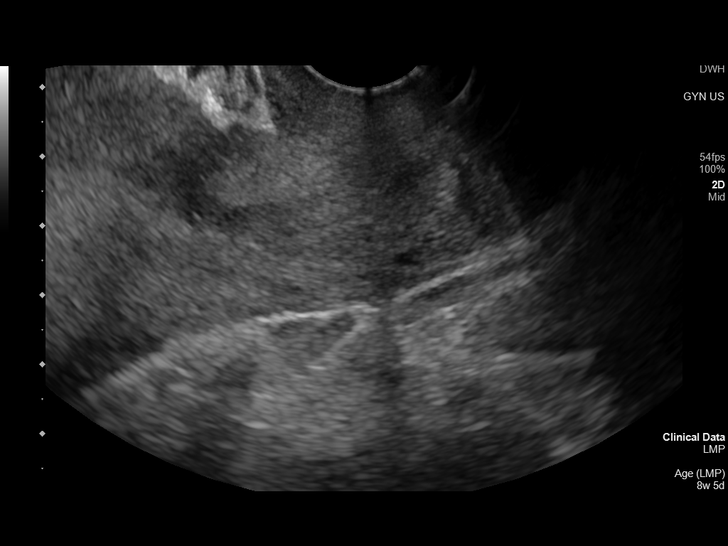

[14 of 28 positions shown; findings below may reference images not displayed]

FINDINGS: Intrauterine gestational sac: Single

Yolk sac:  Visualized.

Embryo:  Visualized.

Cardiac Activity: Visualized.

Heart Rate: 169 bpm

CRL:   20.6 mm   8 w 5 d                  US EDC: 07/22/2021

Subchorionic hemorrhage:  None visualized.

Maternal uterus/adnexae: Bilateral ovaries are within normal limits,
noting a small right corpus luteum.

Pulsed Doppler evaluation of both ovaries demonstrates normal
appearing low-resistance arterial and venous waveforms.
IMPRESSION: Single live intrauterine gestation, with estimated gestational age 8
weeks 5 days by crown-rump length.

No evidence of ovarian torsion.

## 2021-10-28 ENCOUNTER — Encounter: Payer: Self-pay | Admitting: Obstetrics

## 2021-10-28 ENCOUNTER — Ambulatory Visit (INDEPENDENT_AMBULATORY_CARE_PROVIDER_SITE_OTHER): Payer: Medicaid Other | Admitting: Obstetrics

## 2021-10-28 VITALS — BP 124/82 | Ht 62.0 in | Wt 165.4 lb

## 2021-10-28 DIAGNOSIS — N921 Excessive and frequent menstruation with irregular cycle: Secondary | ICD-10-CM

## 2021-10-28 DIAGNOSIS — Z975 Presence of (intrauterine) contraceptive device: Secondary | ICD-10-CM

## 2021-10-28 DIAGNOSIS — Z3040 Encounter for surveillance of contraceptives, unspecified: Secondary | ICD-10-CM

## 2021-10-28 MED ORDER — LO LOESTRIN FE 1 MG-10 MCG / 10 MCG PO TABS: 1.0000 | ORAL_TABLET | Freq: Every day | ORAL | 0 refills | Status: DC

## 2021-10-28 NOTE — Progress Notes (Signed)
Chief Complaint  Patient presents with   nexplanon removal   Patient Carrie [name] is an 26 y.o. year old G2P2002 No LMP recorded. currently nexplanon for contraception who presents for desires for nexplanon removal. It was just placed 07/27/21. She wants it out because she has had irregular bleeding consistently since it was placed. She has not tried anything for the abnormal bleeding.   Past Medical History:  Diagnosis Date   Elevated glucose tolerance test 05/11/2021   12/13 notified of need for 3 hr testing Passed 3 hour testing.   Headache    Heart murmur    as a baby   History of cesarean delivery affecting pregnancy 02/15/2021   Labor and delivery, indication for care 07/19/2021   Pneumonia    as a baby   Supervision of normal pregnancy 01/20/2021    Nursing Staff Provider Office Location  [facility] Dating   CW LMP Language  English Anatomy US  Normal female Flu Vaccine   Genetic Screen  NIPS: normal xy TDaP vaccine   06/07/2020 Hgb A1C or  GTT Early : NA Third trimester : elevated but passed 3 hr test Covid    LAB RESULTS  Rhogam  NA Blood Type A/Positive/-- (08/22 1628)  Feeding Plan Breast Antibody Negative (08/22 1628) Contraception Nexplanon    Past Surgical History:  Procedure Laterality Date   CESAREAN SECTION N/A 11/12/2019   Procedure: CESAREAN SECTION;  Surgeon: Conard Novak, MD;  Location: [facility];  Service: Obstetrics;  Laterality: N/A;   CESAREAN SECTION  07/19/2021   Procedure: CESAREAN SECTION;  Surgeon: Hildred Laser, MD;  Location: [facility];  Service: Obstetrics;;   INCISION AND DRAINAGE PERIRECTAL ABSCESS N/A 01/21/2016   Procedure: IRRIGATION AND DEBRIDEMENT PERIRECTAL ABSCESS;  Surgeon: Leafy Ro, MD;  Location: [facility];  Service: General;  Laterality: N/A;   Family History  Problem Relation Age of Onset   Hypertension Mother    Heart disease Paternal Grandmother    Social History   Socioeconomic History   Marital status: Single    Spouse name: Not  on file   Number of children: Not on file   Years of education: Not on file   Highest education level: Not on file  Occupational History   Occupation: Unemployed  Tobacco Use   Smoking status: Former    Packs/day: 0.25    Types: Cigarettes    Quit date: 11/26/2020    Years since quitting: 0.9   Smokeless tobacco: Never  Vaping Use   Vaping Use: Never used  Substance and Sexual Activity   Alcohol use: Not Currently    Comment: last use 11/28/20- liquor   Drug use: Yes    Frequency: 14.0 times per week    Types: Marijuana    Comment: daily, planning to stop   Sexual activity: Not Currently    Birth control/protection: None  Other Topics Concern   Not on file  Social History Narrative   Not on file   Social Determinants of Health   Financial Resource Strain: Not on file  Food Insecurity: Not on file  Transportation Needs: Not on file  Physical Activity: Not on file  Stress: Not on file  Social Connections: Not on file  Intimate Partner Violence: Not At Risk   Fear of Current or Ex-Partner: No   Emotionally Abused: No   Physically Abused: No   Sexually Abused: No    Medicine list and allergies reviewed and updated.    BP 124/82  Ht 5\' 2"  (1.575 m)   Wt 165 lb 6.4 oz (75 kg)   BMI 30.25 kg/m  Physical Exam Vitals and nursing note reviewed.  Constitutional:      Appearance: Normal appearance.  HENT:     Head: Normocephalic and atraumatic.  Eyes:     Extraocular Movements: Extraocular movements intact.  Pulmonary:     Effort: Pulmonary effort is normal.  Musculoskeletal:        General: Normal range of motion.     Cervical back: Normal range of motion.  Skin:    General: Skin is warm and dry.  Neurological:     General: No focal deficit present.     Mental Status: She is alert and oriented to person, place, and time.  Psychiatric:        Mood and Affect: Mood normal.        Behavior: Behavior normal.        Thought Content: Thought content normal.     Breakthrough bleeding on Nexplanon - Plan: LO LOESTRIN FE 1 MG-10 MCG / 10 MCG tablet  Surveillance of contraceptive implant  Methods of treating abnormal bleeding on Nexplanon were reviewed in detail including the following options:   Adding back estrogen with Premarin or oral contraceptive pills  Course of Lysteda for heavy bleeding  Removing Nexplanon Trial of NSAIDs for menorrhagia and dysmenorrhea. For this we discussed Ibuprofen 600 q6 with pepcid for stomach lining protection or taking with food. Patient desires trial of OCPs. Sample pack of lo loestrin was provided. Patient denies history of migraine headaches with aura, uncontrolled hypertension, liver disease or blood clots. If irregular bleeding persists we will then consider removal  Return in about 6 weeks (around 12/09/2021) for contraception follow up.

## 2021-10-28 NOTE — Patient Instructions (Signed)
Have a great year! Please call with any concerns. Don't forget to wear your seatbelt everyday! If you are not signed up on MyChart, please ask [place] how to sign up for it!   In a world where you can be anything, please be kind.   Body mass index is 30.25 kg/m.  A Healthy Lifestyle: Care Instructions Your Care Instructions  A healthy lifestyle can help you feel good, stay at a healthy weight, and have plenty of energy for both work and play. A healthy lifestyle is something you can share with your whole family. A healthy lifestyle also can lower your risk for serious health problems, such as high blood pressure, heart disease, and diabetes. You can follow a few steps listed below to improve your health and the health of your family. Follow-up care is a key part of your treatment and safety. Be sure to make and go to all appointments, and call your doctor if you are having problems. It's also a good idea to know your test results and keep a list of the medicines you take. How can you care for yourself at home? Do not eat too much sugar, fat, or fast foods. You can still have dessert and treats now and then. The goal is moderation. Start small to improve your eating habits. Pay attention to portion sizes, drink less juice and soda pop, and eat more fruits and vegetables. Eat a healthy amount of food. A 3-ounce serving of meat, for example, is about the size of a deck of cards. Fill the rest of your plate with vegetables and whole grains. Limit the amount of soda and sports drinks you have every day. Drink more water when you are thirsty. Eat at least 5 servings of fruits and vegetables every day. It may seem like a lot, but it is not hard to reach this goal. A serving or helping is 1 piece of fruit, 1 cup of vegetables, or 2 cups of leafy, raw vegetables. Have an apple or some carrot sticks as an afternoon snack instead of a candy bar. Try to have fruits and/or vegetables at every meal. Make exercise  part of your daily routine. You may want to start with simple activities, such as walking, bicycling, or slow swimming. Try to be active 30 to 60 minutes every day. You do not need to do all 30 to 60 minutes all at once. For example, you can exercise 3 times a day for 10 or 20 minutes. Moderate exercise is safe for most people, but it is always a good idea to talk to your doctor before starting an exercise program. Keep moving. Mow the lawn, work in the garden, or [employer]. Take the stairs instead of the elevator at work. If you smoke, quit. People who smoke have an increased risk for heart attack, stroke, cancer, and other lung illnesses. Quitting is hard, but there are ways to boost your chance of quitting tobacco for good. Use nicotine gum, patches, or lozenges. Ask your doctor about stop-smoking programs and medicines. Keep trying. In addition to reducing your risk of diseases in the future, you will notice some benefits soon after you stop using tobacco. If you have shortness of breath or asthma symptoms, they will likely get better within a few weeks after you quit. Limit how much alcohol you drink. Moderate amounts of alcohol (up to 2 drinks a day for men, 1 drink a day for women) are okay. But drinking too much can lead to  liver problems, high blood pressure, and other health problems. Family health If you have a family, there are many things you can do together to improve your health. Eat meals together as a family as often as possible. Eat healthy foods. This includes fruits, vegetables, lean meats and dairy, and whole grains. Include your family in your fitness plan. Most people think of activities such as jogging or tennis as the way to fitness, but there are many ways you and your family can be more active. Anything that makes you breathe hard and gets your heart pumping is exercise. Here are some tips: Walk to do errands or to take your child to school or the bus. Go for a family  bike ride after dinner instead of watching TV. Care instructions adapted under license by your healthcare professional. This care instruction is for use with your licensed healthcare professional. If you have questions about a medical condition or this instruction, always ask your healthcare professional. [place] any warranty or liability for your use of this information.

## 2021-12-07 ENCOUNTER — Ambulatory Visit: Payer: Medicaid Other | Admitting: Advanced Practice Midwife

## 2022-03-21 ENCOUNTER — Ambulatory Visit: Payer: Medicaid Other | Admitting: Licensed Practical Nurse

## 2022-04-13 ENCOUNTER — Other Ambulatory Visit: Payer: Self-pay

## 2022-04-13 ENCOUNTER — Emergency Department
Admission: EM | Admit: 2022-04-13 | Discharge: 2022-04-13 | Disposition: A | Discharge status: Home / Self Care | Payer: Medicaid Other | Attending: Emergency Medicine | Admitting: Emergency Medicine

## 2022-04-13 DIAGNOSIS — G43909 Migraine, unspecified, not intractable, without status migrainosus: Secondary | ICD-10-CM | POA: Insufficient documentation

## 2022-04-13 DIAGNOSIS — R519 Headache, unspecified: Secondary | ICD-10-CM | POA: Diagnosis present

## 2022-04-13 DIAGNOSIS — M542 Cervicalgia: Secondary | ICD-10-CM | POA: Diagnosis not present

## 2022-04-13 DIAGNOSIS — B349 Viral infection, unspecified: Secondary | ICD-10-CM | POA: Diagnosis not present

## 2022-04-13 DIAGNOSIS — Z20822 Contact with and (suspected) exposure to covid-19: Secondary | ICD-10-CM | POA: Diagnosis not present

## 2022-04-13 LAB — RESP PANEL BY RT-PCR (FLU A&B, COVID) ARPGX2
Influenza A by PCR: NEGATIVE
Influenza B by PCR: NEGATIVE
SARS Coronavirus 2 by RT PCR: NEGATIVE

## 2022-04-13 MED ORDER — ONDANSETRON 8 MG PO TBDP: 8.0000 mg | ORAL_TABLET | Freq: Three times a day (TID) | ORAL | 0 refills | Status: DC | PRN

## 2022-04-13 MED ORDER — BUTALBITAL-APAP-CAFFEINE 50-325-40 MG PO TABS
2.0000 | ORAL_TABLET | Freq: Once | ORAL | Status: AC
  Administered 2022-04-13: 2 via ORAL
  Filled 2022-04-13: qty 2

## 2022-04-13 MED ORDER — BUTALBITAL-APAP-CAFFEINE 50-325-40 MG PO TABS: 1.0000 | ORAL_TABLET | Freq: Four times a day (QID) | ORAL | 0 refills | Status: DC | PRN

## 2022-04-13 NOTE — ED Provider Notes (Signed)
[facility] Provider Note   Event Date/Time   First MD Initiated Contact with Patient 04/13/22 [phone]     (approximate) History  URI  HPI Carrie [name] is a 26 y.o. female with a stated past medical history of migraines who presents for headache, photophobia, and nausea/vomiting over the last 2 weeks that is significantly worsened over the past 24 hours.  Patient states that she has had symptoms similar to previous migraines that she has had in the past over the last 2 weeks including waking up with a headache, nausea, and vomiting.  Patient states that over the last 24 hours this headache is significantly worsened as well as now has associated generalized muscle weakness, fatigue, neck pain, and subjective fevers.  Patient denies any recent travel or sick contacts.  Patient denies any photophobia, phonophobia, or meningismus. ROS: Patient currently denies any vision changes, tinnitus, difficulty speaking, facial droop, sore throat, chest pain, shortness of breath, abdominal pain, nausea/vomiting/diarrhea, dysuria, or weakness/numbness/paresthesias in any extremity   Physical Exam  Triage Vital Signs: ED Triage Vitals  Enc Vitals Group     BP 04/13/22 0912 131/84     Pulse Rate 04/13/22 0912 (!) 114     Resp 04/13/22 0912 16     Temp 04/13/22 0912 99 F (37.2 C)     Temp Source 04/13/22 0912 Oral     SpO2 04/13/22 0912 100 %     Weight 04/13/22 0909 165 lb 5.5 oz (75 kg)     Height 04/13/22 0909 5\' 2"  (1.575 m)     Head Circumference --      Peak Flow --      Pain Score 04/13/22 0909 0     Pain Loc --      Pain Edu? --      Excl. in GC? --    Most recent vital signs: Vitals:   04/13/22 0912  BP: 131/84  Pulse: (!) 114  Resp: 16  Temp: 99 F (37.2 C)  SpO2: 100%   General: Awake, oriented x4. CV:  Good peripheral perfusion.  Resp:  Normal effort.  Abd:  No distention.  Other:  Young adult African-American female sitting in chair in no acute  distress.  No meningismus.  NIHSS is 0. ED Results / Procedures / Treatments  Labs (all labs ordered are listed, but only abnormal results are displayed) Labs Reviewed  RESP PANEL BY RT-PCR (FLU A&B, COVID) [id number]  PROCEDURES: Critical Care performed: No Procedures MEDICATIONS ORDERED IN ED: Medications  butalbital-acetaminophen-caffeine (FIORICET) 50-325-40 MG per tablet 2 tablet (2 tablets Oral Given 04/13/22 0940)   IMPRESSION / MDM / ASSESSMENT AND PLAN / ED COURSE  I reviewed the triage vital signs and the nursing notes.                              Patient's presentation is most consistent with acute presentation with potential threat to life or bodily function. Otherwise healthy patient presenting with constellation of symptoms likely representing uncomplicated viral upper respiratory symptoms as characterized by generalized fatigue, headache, and subjective fevers  Unlikely PTA/RPA: no hot potato voice, no uvular deviation, Unlikely Esophageal rupture: No history of dysphagia Unlikely deep space infection/Ludwig's Low suspicion for CNS infection bacterial sinusitis, or pneumonia given exam and history.  Unlikely Strep or EBV as centor negative and with no pharyngeal exudate, posterior LAD, or splenomegaly.  Will attempt to alleviate symptoms conservatively; no  overt indications at this time for antibiotics. No respiratory distress, otherwise relatively well appearing and nontoxic. Will discuss prompt follow up with PMD and strict return precautions.   FINAL CLINICAL IMPRESSION(S) / ED DIAGNOSES   Final diagnoses:  Viral syndrome  Migraine without status migrainosus, not intractable, unspecified migraine type   Rx / DC Orders   ED Discharge Orders          Ordered    butalbital-acetaminophen-caffeine (FIORICET) 50-325-40 MG tablet  Every 6 hours PRN        04/13/22 1125    ondansetron (ZOFRAN-ODT) 8 MG disintegrating tablet  Every 8 hours PRN        04/13/22 1125            Note:  This document was prepared using Dragon voice recognition software and may include unintentional dictation errors.   Merwyn Katos, MD 04/13/22 1215

## 2022-04-13 NOTE — ED Triage Notes (Signed)
C/O sinus congestion, painful cough, sore neck x 2 weeks.  Also c/O chills and vomiting.  AAOx3.  Skin warm and dry. NAD.  No SOB/DOE.  Sinus congestion appreciated.

## 2022-04-22 IMAGING — US US OB FOLLOW-UP
1 series · 13 of 28 positions shown · non-contrast
Comparison: none

CLINICAL DATA: Third trimester pregnancy. Measuring small for
dates. Evaluate fetal growth and amniotic fluid.

EXAM:
OBSTETRIC 14+ WK ULTRASOUND FOLLOW-UP

[Series 1: us ob follow-up · 0.26mm/px · 13 of 44 slices shown]
[im 2/44]
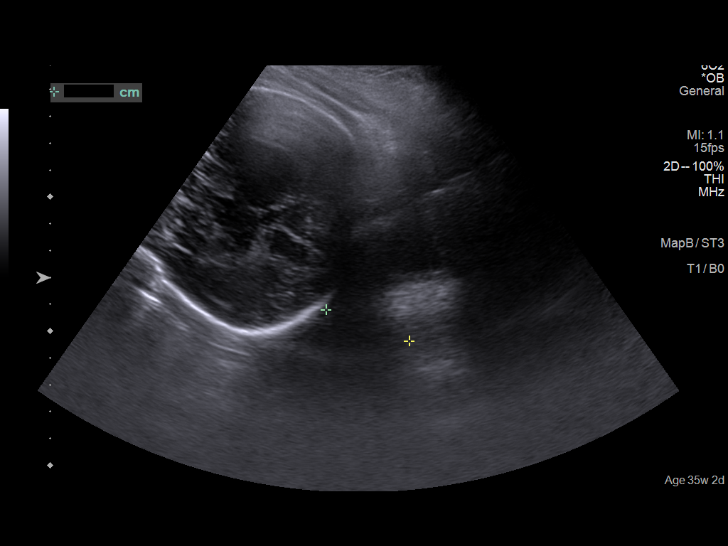
[im 5/44]
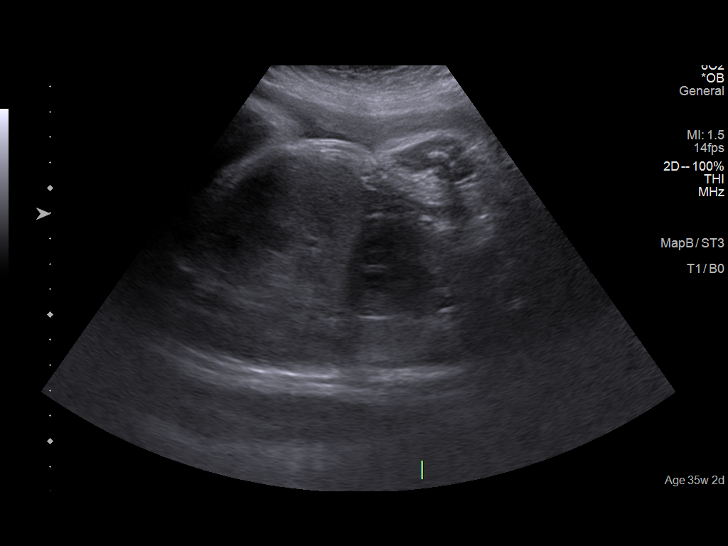
[im 8/44]
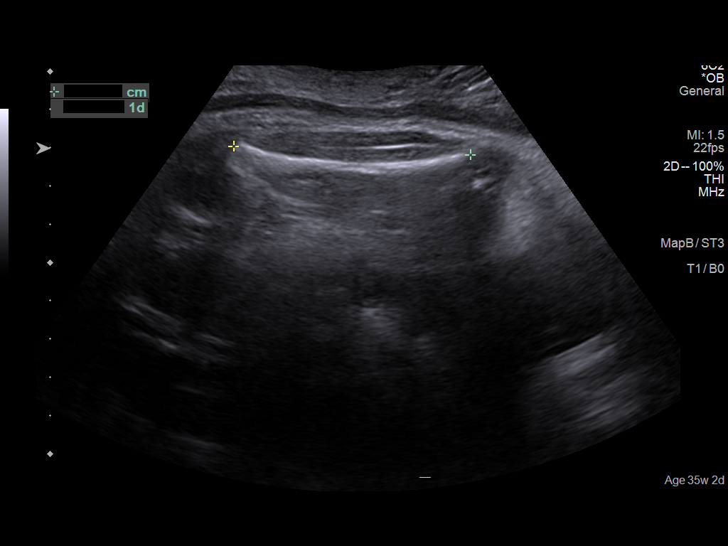
[im 12/44]
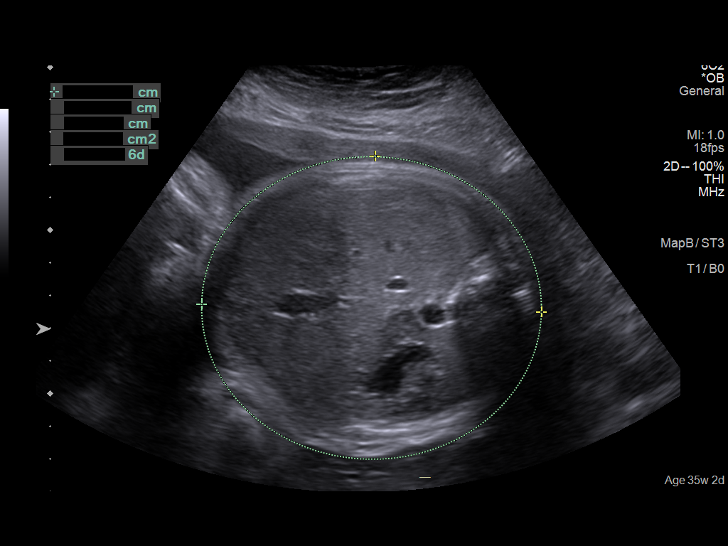
[im 15/44]
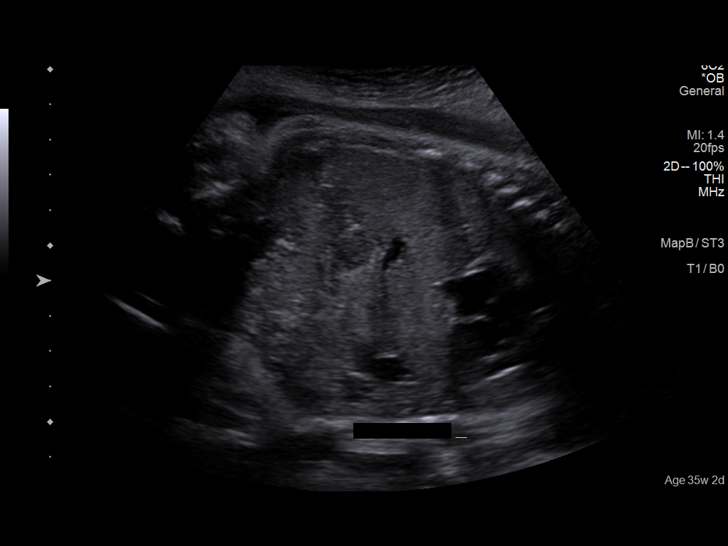
[im 18/44]
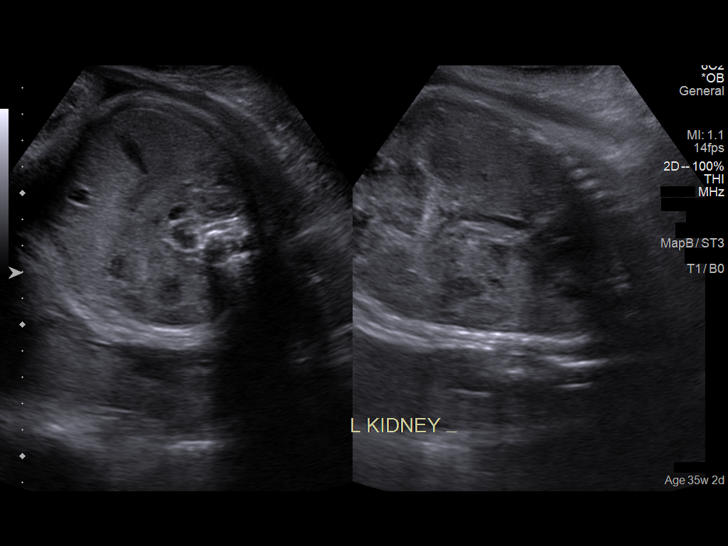
[im 23/44]
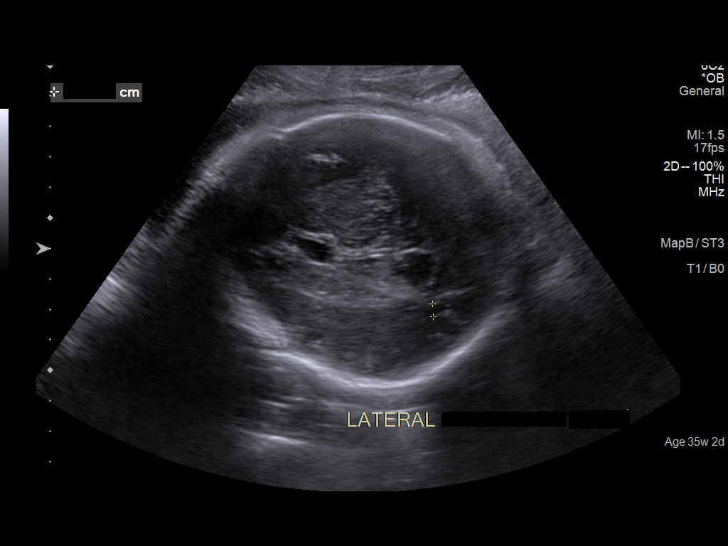
[im 26/44]
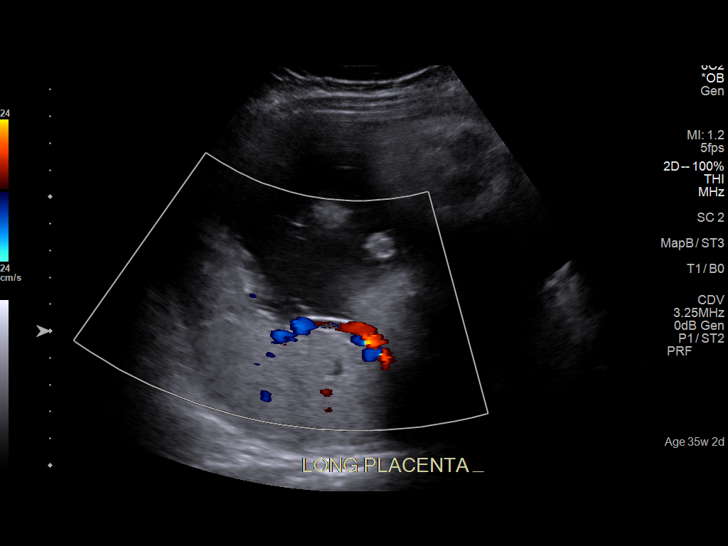
[im 29/44]
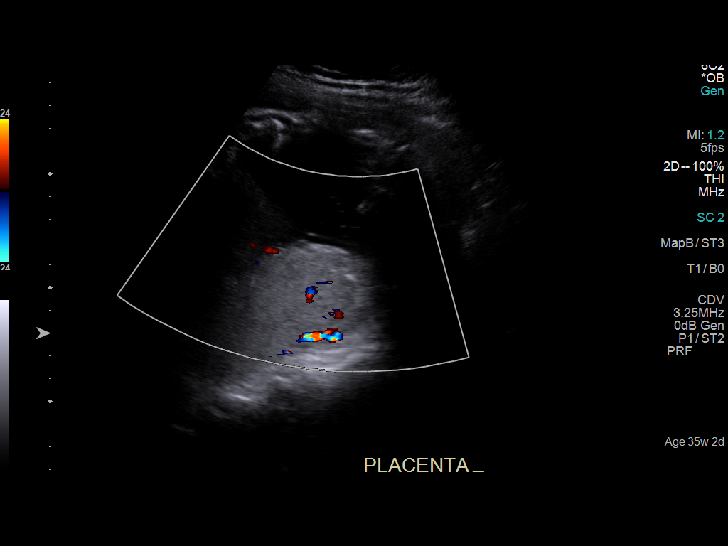
[im 32/44]
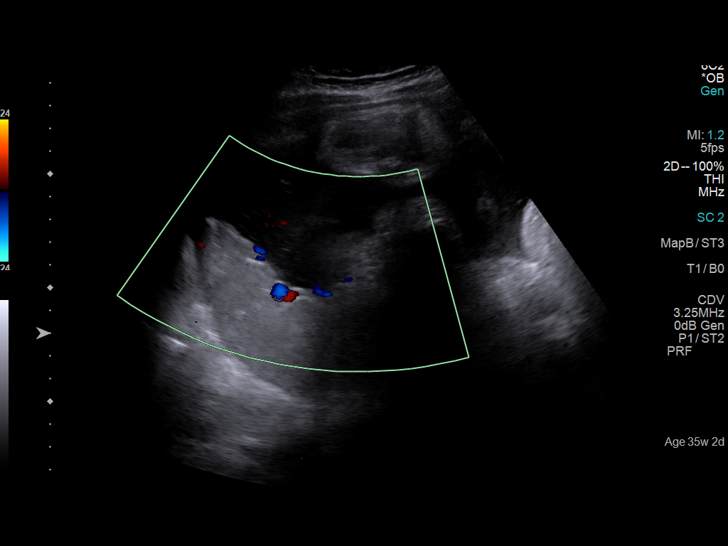
[im 36/44]
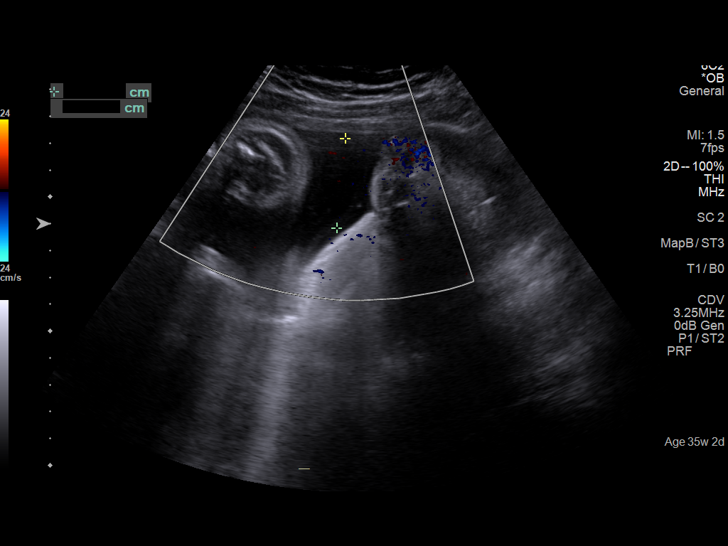
[im 39/44]
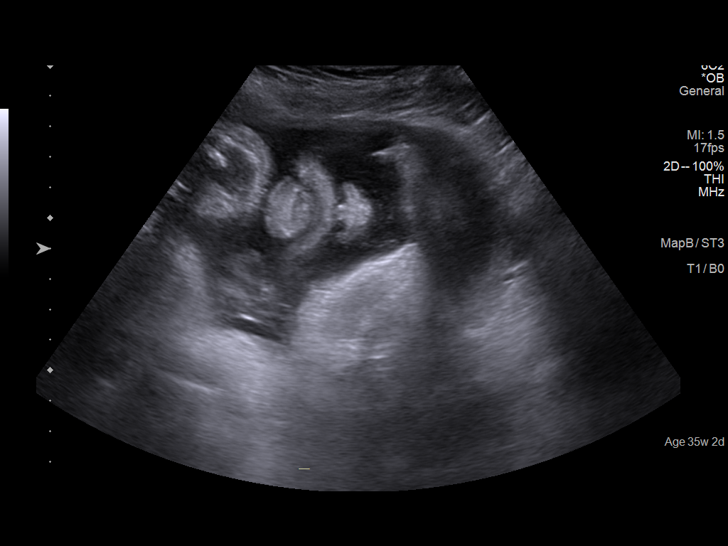
[im 42/44]
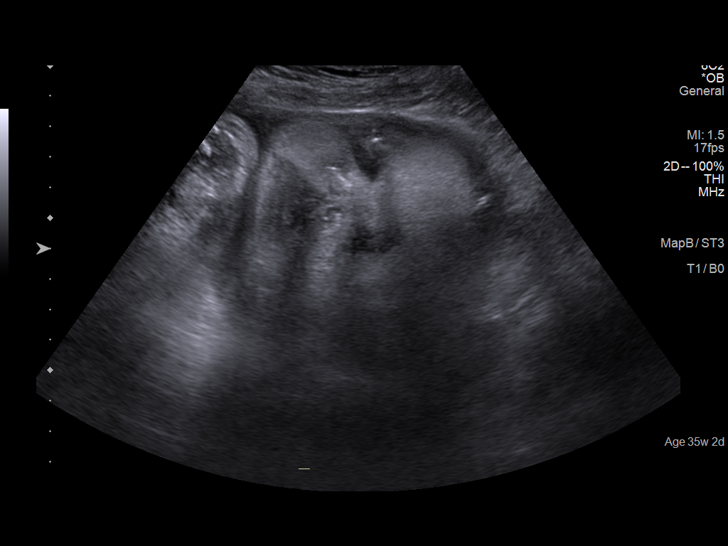

[13 of 28 positions shown; findings below may reference images not displayed]

FINDINGS: Number of Fetuses: 1

Heart Rate:  132 bpm

Movement: Yes

Presentation: Cephalic

Previa: No

Placental Location: Posterior

Amniotic Fluid (Subjective): Within normal limits

Amniotic Fluid (Objective):

AFI 13.2 cm (5%ile= 7.9 cm, 95%= 24.9 cm for 35 wks)

FETAL BIOMETRY

BPD:  8.6cm 34w 4d

HC:    30.1cm 33w 3d

AC:    31.6cm 35w 0d

FL:    6.3cm 32w 4d

Current Mean GA: 33w 5d US EDC: 08/05/2021

Assigned GA: 35w 2d Assigned EDC: 07/25/2021

Estimated Fetal Weight:  2,345g 18%ile

FETAL ANATOMY

Lateral Ventricles: Appears normal

Thalami/CSP: Appears normal

Posterior Fossa: Previously seen

Nuchal Region: Previously seen

Upper Lip: Appears normal

Spine: Previously seen

4 Chamber Heart on Left: Previously seen

LVOT: Previously seen

RVOT: Previously seen

Stomach on Left: Appears normal

3 Vessel Cord: Previously seen

Cord Insertion site: Previously seen

Kidneys: Appears normal

Bladder: Appears normal

Extremities: Previously seen

Sex: Previously Seen

Technical Limitations: Advanced gestational age and fetal position

Maternal Findings:

Cervix:  Not evaluated (>34 wks)
IMPRESSION: Assigned GA currently 35 weeks 2 days. EFW is currently at 18 %ile.
Recommend continued ultrasound follow-up of fetal growth in 3 weeks.

Amniotic fluid volume within normal limits, with AFI of 13.2 cm.

## 2022-07-13 ENCOUNTER — Telehealth: Payer: Self-pay | Admitting: Advanced Practice Midwife

## 2022-07-13 NOTE — Telephone Encounter (Signed)
Tried reaching out to patient to get her appt r/s on 07/14/22 with JEG. VM full unable to leave message

## 2022-07-14 ENCOUNTER — Ambulatory Visit: Payer: Medicaid Other | Admitting: Advanced Practice Midwife

## 2022-07-19 ENCOUNTER — Ambulatory Visit (INDEPENDENT_AMBULATORY_CARE_PROVIDER_SITE_OTHER): Payer: Medicaid Other | Admitting: Advanced Practice Midwife

## 2022-07-19 ENCOUNTER — Encounter: Payer: Self-pay | Admitting: Advanced Practice Midwife

## 2022-07-19 VITALS — BP 130/78 | HR 71 | Wt 141.9 lb

## 2022-07-19 DIAGNOSIS — Z3046 Encounter for surveillance of implantable subdermal contraceptive: Secondary | ICD-10-CM

## 2022-07-19 DIAGNOSIS — Z3042 Encounter for surveillance of injectable contraceptive: Secondary | ICD-10-CM

## 2022-07-19 MED ORDER — MEDROXYPROGESTERONE ACETATE 150 MG/ML IM SUSP
150.0000 mg | Freq: Once | INTRAMUSCULAR | Status: AC
  Administered 2022-07-19: 150 mg via INTRAMUSCULAR

## 2022-07-20 ENCOUNTER — Encounter: Payer: Self-pay | Admitting: Advanced Practice Midwife

## 2022-07-20 NOTE — Telephone Encounter (Signed)
The patient was seen on 2/20 with JEG

## 2022-07-20 NOTE — Progress Notes (Signed)
[facility]  Date of Service: 07/19/2022  GYNECOLOGY PROCEDURE NOTE  Nexplanon removal discussed in detail.  Risks of infection, bleeding, nerve injury all reviewed.  Patient understands risks and desires to proceed.  Verbal consent obtained.  She reports undesired side effects since Nexplanon was inserted nearly 1 year ago. She has nausea, vomiting, fatigue, headache, irregular uterine bleeding. Patient is certain she wants the Nexplanon removed.  All questions answered.   Review of Systems  Constitutional:  Positive for malaise/fatigue. Negative for chills and fever.  HENT:  Negative for congestion, ear discharge, ear pain, hearing loss, sinus pain and sore throat.   Eyes:  Negative for blurred vision and double vision.  Respiratory:  Negative for cough, shortness of breath and wheezing.   Cardiovascular:  Negative for chest pain, palpitations and leg swelling.  Gastrointestinal:  Positive for nausea and vomiting. Negative for abdominal pain, blood in stool, constipation, diarrhea, heartburn and melena.  Genitourinary:  Negative for dysuria, flank pain, frequency, hematuria and urgency.  Musculoskeletal:  Negative for back pain, joint pain and myalgias.  Skin:  Negative for itching and rash.  Neurological:  Positive for headaches. Negative for dizziness, tingling, tremors, sensory change, speech change, focal weakness, seizures, loss of consciousness and weakness.  Endo/Heme/Allergies:  Negative for environmental allergies. Does not bruise/bleed easily.       Positive for irregular bleeding  Psychiatric/Behavioral:  Negative for depression, hallucinations, memory loss, substance abuse and suicidal ideas. The patient is not nervous/anxious and does not have insomnia.    Vital Signs: BP 130/78   Pulse 71   Wt 141 lb 14.4 oz (64.4 kg)   LMP 07/18/2022 (Exact Date)   Breastfeeding No   BMI 25.95 kg/m  Constitutional: Well nourished, well developed female in no acute distress.  HEENT:  normal Skin: Warm and dry.  Extremity:  no edema   Respiratory:  Normal respiratory effort Psych: Alert and Oriented x3. No memory deficits. Normal mood and affect.    Procedure: Patient placed in dorsal supine with left arm above head, elbow flexed at 90 degrees, arm resting on examination table.  Nexplanon identified without problems.  Betadine scrub x3.  2 ml of 1% lidocaine injected under Nexplanon device without problems.  Sterile gloves applied.  Small 0.5cm incision made at distal tip of Nexplanon device with 11 blade scalpel.  Nexplanon brought to incision and grasped with a small kelly clamp.  Nexplanon removed intact without problems.  Pressure applied to incision.  Hemostasis obtained.  Steri-strips applied, followed by bandage and compression dressing.  Patient tolerated procedure well.  No complications.   Assessment: 27 y.o. year old female now s/p uncomplicated Nexplanon removal.  Plan: 1.  Patient given post procedure precautions and asked to call for fever, chills, redness or drainage from her incision, bleeding from incision.  She understands she will likely have a small bruise near site of removal and can remove bandage tomorrow and steri-strips in approximately 1 week.  2) Contraception: Depo Provera first injection today   Christean Leaf, [facility] 07/20/22, 10:02 AM    J2001 for lidocaine block, 11982 for nexplanon removal

## 2022-07-21 ENCOUNTER — Encounter: Payer: Self-pay | Admitting: Advanced Practice Midwife

## 2022-08-31 ENCOUNTER — Emergency Department
Admission: EM | Admit: 2022-08-31 | Discharge: 2022-08-31 | Disposition: A | Discharge status: Home / Self Care | Payer: Medicaid Other | Attending: Emergency Medicine | Admitting: Emergency Medicine

## 2022-08-31 ENCOUNTER — Emergency Department: Payer: Medicaid Other

## 2022-08-31 ENCOUNTER — Other Ambulatory Visit: Payer: Self-pay

## 2022-08-31 DIAGNOSIS — R0789 Other chest pain: Secondary | ICD-10-CM | POA: Insufficient documentation

## 2022-08-31 DIAGNOSIS — R079 Chest pain, unspecified: Secondary | ICD-10-CM

## 2022-08-31 LAB — BASIC METABOLIC PANEL
Anion gap: 7 (ref 5–15)
BUN: 11 mg/dL (ref 6–20)
CO2: 23 mmol/L (ref 22–32)
Calcium: 9.3 mg/dL (ref 8.9–10.3)
Chloride: 109 mmol/L (ref 98–111)
Creatinine, Ser: 0.86 mg/dL (ref 0.44–1.00)
GFR, Estimated: >60 mL/min (ref >60)
Glucose, Bld: 107 mg/dL — ABNORMAL HIGH (ref 70–99)
Potassium: 3.7 mmol/L (ref 3.5–5.1)
Sodium: 139 mmol/L (ref 135–145)

## 2022-08-31 LAB — CBC
HCT: 38.6 % (ref 36.0–46.0)
Hemoglobin: 12.8 g/dL (ref 12.0–15.0)
MCH: 29.4 pg (ref 26.0–34.0)
MCHC: 33.2 g/dL (ref 30.0–36.0)
MCV: 88.7 fL (ref 80.0–100.0)
Platelets: 301 10*3/uL (ref 150–400)
RBC: 4.35 MIL/uL (ref 3.87–5.11)
RDW: 14.5 % (ref 11.5–15.5)
WBC: 6.3 10*3/uL (ref 4.0–10.5)
nRBC: 0 % (ref 0.0–0.2)

## 2022-08-31 LAB — TSH: TSH: 0.363 u[IU]/mL (ref 0.350–4.500)

## 2022-08-31 LAB — TROPONIN I (HIGH SENSITIVITY): Troponin I (High Sensitivity): 2 ng/L (ref <18)

## 2022-08-31 LAB — T4, FREE: Free T4: 0.86 ng/dL (ref 0.61–1.12)

## 2022-08-31 NOTE — ED Provider Notes (Signed)
[facility] Provider Note    Event Date/Time   First MD Initiated Contact with Patient 08/31/22 1333     (approximate)   History   Chief Complaint: Chest Pain   HPI  Carrie [name] is a 27 y.o. female with no significant past medical history who comes ED complaining of chest pain for the past 5 days, intermittent, tightness, no aggravating or alleviating factors.  No shortness of breath diaphoresis or vomiting, not pleuritic, not exertional.  She does note that she has been having weight loss over the past several weeks.  She is also in a very stressful situation, currently separating from her partner.  She does feel safe at home and has trusted acquaintances that she can confide in.  Currently symptoms are relieved.  Requesting note for work.     Physical Exam   Triage Vital Signs: ED Triage Vitals  Enc Vitals Group     BP 08/31/22 1124 138/84     Pulse Rate 08/31/22 1124 89     Resp 08/31/22 1124 16     Temp 08/31/22 1124 98.9 F (37.2 C)     Temp src --      SpO2 08/31/22 1124 100 %     Weight 08/31/22 1125 135 lb (61.2 kg)     Height 08/31/22 1125 5\' 2"  (1.575 m)     Head Circumference --      Peak Flow --      Pain Score 08/31/22 1125 6     Pain Loc --      Pain Edu? --      Excl. in [place]? --     Most recent vital signs: Vitals:   08/31/22 1124  BP: 138/84  Pulse: 89  Resp: 16  Temp: 98.9 F (37.2 C)  SpO2: 100%    General: Awake, no distress.  CV:  Good peripheral perfusion.  Regular rate rhythm Resp:  Normal effort.  Clear to auscultation bilaterally Abd:  No distention.  Other:  Moist oral mucosa   ED Results / Procedures / Treatments   Labs (all labs ordered are listed, but only abnormal results are displayed) Labs Reviewed  BASIC METABOLIC PANEL - Abnormal; Notable for the following components:      Result Value   Glucose, Bld 107 (*)    All other components within normal limits  CBC  TSH  T4, FREE  POC URINE  PREG, ED  TROPONIN I (HIGH SENSITIVITY)  TROPONIN I (HIGH SENSITIVITY)     EKG Interpreted by me Sinus rhythm rate of 84.  Normal axis intervals QRS ST segments and T waves   RADIOLOGY Chest x-ray interpreted by me, appears normal.  Radiology report reviewed   PROCEDURES:  Procedures   MEDICATIONS ORDERED IN ED: Medications - No data to display   IMPRESSION / MDM / [place] / ED COURSE  I reviewed the triage vital signs and the nursing notes.  DDx: Hyperthyroidism, GERD, anxiety, carditis, pneumothorax, pneumonia  Patient's presentation is most consistent with acute presentation with potential threat to life or bodily function.  Patient presents with atypical chest pain.  Vital signs exam and labs and chest x-ray and EKG are all unremarkable. Considering the patient's symptoms, medical history, and physical examination today, I have low suspicion for ACS, PE, TAD, pneumothorax, carditis, mediastinitis, pneumonia, CHF, or sepsis. Stable for discharge       FINAL CLINICAL IMPRESSION(S) / ED DIAGNOSES   Final diagnoses:  Nonspecific chest  pain     Rx / DC Orders   ED Discharge Orders     None        Note:  This document was prepared using Dragon voice recognition software and may include unintentional dictation errors.   Carrie Mew, MD 08/31/22 1534

## 2022-08-31 NOTE — ED Triage Notes (Signed)
Pt to ED for chest, back and neck pain for past 5 days. Also endorses weight loss in past 2 weeks, denies v/d or decreased eating.  RR even and unlabored. NAD noted

## 2022-09-30 ENCOUNTER — Emergency Department
Admission: EM | Admit: 2022-09-30 | Discharge: 2022-09-30 | Disposition: A | Discharge status: Home / Self Care | Payer: Medicaid Other | Attending: Emergency Medicine | Admitting: Emergency Medicine

## 2022-09-30 ENCOUNTER — Encounter: Payer: Self-pay | Admitting: Emergency Medicine

## 2022-09-30 ENCOUNTER — Other Ambulatory Visit: Payer: Self-pay

## 2022-09-30 DIAGNOSIS — K219 Gastro-esophageal reflux disease without esophagitis: Secondary | ICD-10-CM | POA: Insufficient documentation

## 2022-09-30 DIAGNOSIS — R5383 Other fatigue: Secondary | ICD-10-CM | POA: Diagnosis present

## 2022-09-30 MED ORDER — FAMOTIDINE 20 MG PO TABS: 20.0000 mg | ORAL_TABLET | Freq: Two times a day (BID) | ORAL | 0 refills | Status: DC

## 2022-09-30 MED ORDER — PROPRANOLOL HCL 20 MG PO TABS: 20.0000 mg | ORAL_TABLET | Freq: Three times a day (TID) | ORAL | 0 refills | Status: DC

## 2022-09-30 MED ORDER — ALUMINUM-MAGNESIUM-SIMETHICONE 200-200-20 MG/5ML PO SUSP
30.0000 mL | Freq: Three times a day (TID) | ORAL | 0 refills | Status: DC
Start: 2022-09-30 — End: 2022-11-29

## 2022-09-30 NOTE — Discharge Instructions (Addendum)
Take propranolol 3 times a day as needed for symptoms of restlessness, palpitations, or anxiousness.    Take Maalox and Pepcid in the morning to help with stomach symptoms.

## 2022-09-30 NOTE — ED Provider Notes (Signed)
[facility] Provider Note    Event Date/Time   First MD Initiated Contact with Patient 09/30/22 1018     (approximate)   History   Fatigue   HPI  Carrie [name] is a 27 y.o. female with no significant past medical history who comes ED complaining of fatigue and feeling cold for the past month.  Denies any pain.  Patient reports her primary concern is that she might have diabetes.  No polyuria or polydipsia or dysuria.  She is on Depo-Provera for contraception, last injection was 2-1/2 months ago.  Patient also reports nausea in the morning and occasional morning vomiting.  She reports her diet is low in fresh vegetables, and she is still under quite a bit of stress which has been going on for the past month or more.  She reports only eating about once per day.  No diarrhea  Denies SI HI or hallucinations.     Physical Exam   Triage Vital Signs: ED Triage Vitals  Enc Vitals Group     BP 09/30/22 1002 137/89     Pulse Rate 09/30/22 1002 75     Resp 09/30/22 1002 16     Temp 09/30/22 1002 98.4 F (36.9 C)     Temp Source 09/30/22 1002 Oral     SpO2 09/30/22 1002 100 %     Weight 09/30/22 0959 141 lb 1.5 oz (64 kg)     Height 09/30/22 0959 5\' 2"  (1.575 m)     Head Circumference --      Peak Flow --      Pain Score 09/30/22 0958 0     Pain Loc --      Pain Edu? --      Excl. in [place]? --     Most recent vital signs: Vitals:   09/30/22 1002  BP: 137/89  Pulse: 75  Resp: 16  Temp: 98.4 F (36.9 C)  SpO2: 100%    General: Awake, no distress.  CV:  Good peripheral perfusion.  Regular rate and rhythm Resp:  Normal effort.  Clear to auscultation bilaterally Abd:  No distention.  Soft, nontender Other:  Moist oral mucosa.  Mild hypertrophy of the lingual tonsillar tissue.  No lower extremity edema.  Thyroid nonpalpable   ED Results / Procedures / Treatments   Labs (all labs ordered are listed, but only abnormal results are displayed) Labs  Reviewed - No data to display   RADIOLOGY Interpreted by me Normal sinus rhythm rate of 72.  Normal axis and intervals.  Normal QRS and ST segments.  Isolated T wave inversion in lead V3 which is nonspecific.  Compared to prior EKG on August 31, 2022, no significant change.   PROCEDURES:  Procedures   MEDICATIONS ORDERED IN ED: Medications - No data to display   IMPRESSION / MDM / ASSESSMENT AND PLAN / ED COURSE  I reviewed the triage vital signs and the nursing notes.                              Patient presents with vague subacute symptoms, without any focal pain.  She is nontoxic, well-appearing, in good spirits.  Exam is benign.  No evidence of infection, ACS PE dissection pericarditis pericardial effusion hyperthyroidism electrolyte abnormality anemia or significant dehydration  Will give as needed Maalox Pepcid propranolol, recommend follow-up with her PCP at [facility].       FINAL CLINICAL  IMPRESSION(S) / ED DIAGNOSES   Final diagnoses:  Fatigue, unspecified type  Gastroesophageal reflux disease without esophagitis     Rx / DC Orders   ED Discharge Orders          Ordered    propranolol (INDERAL) 20 MG tablet  3 times daily        09/30/22 1043    famotidine (PEPCID) 20 MG tablet  2 times daily        09/30/22 1043    aluminum-magnesium hydroxide-simethicone (MAALOX) 200-200-20 MG/5ML SUSP  3 times daily before meals & bedtime        09/30/22 1043             Note:  This document was prepared using Dragon voice recognition software and may include unintentional dictation errors.   Sharman Cheek, MD 09/30/22 1051

## 2022-09-30 NOTE — ED Triage Notes (Signed)
Pt to ED via POV. Pt states that she has been having issues with fatigue, temperature issues, and feeling like her heart is racing. Pt states that this has been going on for about a month. Pt states that she has also lost about 10-15 pounds in the last month without trying. Pt denies known thyroid issues.

## 2022-10-04 ENCOUNTER — Other Ambulatory Visit (HOSPITAL_COMMUNITY)
Admission: RE | Admit: 2022-10-04 | Discharge: 2022-10-04 | Disposition: A | Discharge status: Home / Self Care | Payer: Medicaid Other | Source: Ambulatory Visit | Attending: Obstetrics and Gynecology | Admitting: Obstetrics and Gynecology

## 2022-10-04 ENCOUNTER — Encounter: Payer: Self-pay | Admitting: Obstetrics and Gynecology

## 2022-10-04 ENCOUNTER — Ambulatory Visit (INDEPENDENT_AMBULATORY_CARE_PROVIDER_SITE_OTHER): Payer: Medicaid Other | Admitting: Obstetrics and Gynecology

## 2022-10-04 VITALS — BP 106/60 | Ht 62.0 in | Wt 136.0 lb

## 2022-10-04 DIAGNOSIS — N921 Excessive and frequent menstruation with irregular cycle: Secondary | ICD-10-CM

## 2022-10-04 DIAGNOSIS — K591 Functional diarrhea: Secondary | ICD-10-CM

## 2022-10-04 DIAGNOSIS — Z113 Encounter for screening for infections with a predominantly sexual mode of transmission: Secondary | ICD-10-CM | POA: Diagnosis present

## 2022-10-04 DIAGNOSIS — R110 Nausea: Secondary | ICD-10-CM | POA: Diagnosis not present

## 2022-10-04 DIAGNOSIS — R5383 Other fatigue: Secondary | ICD-10-CM

## 2022-10-04 MED ORDER — ONDANSETRON HCL 4 MG PO TABS: 4.0000 mg | ORAL_TABLET | Freq: Three times a day (TID) | ORAL | 1 refills | Status: DC | PRN

## 2022-10-04 NOTE — Progress Notes (Signed)
[facility], [place]   Chief Complaint  Patient presents with   Vaginal Bleeding    Everyday spotting, no abnormal pain. Fatigue and nauseous for the past week    HPI:      Ms. Carrie [name] is a 27 y.o. [id number] whose LMP was No LMP recorded. Patient has had an injection., presents today for BTB with depo.  Nexplanon removed 2/24, started depo same day. Had BTB with nexplanon and sx have persisted with depo. Bleeding is light with wiping, almost daily. No dysmen. She is sexually active, no pain/bleeding. No new partners. Neg STD testing 8/22; neg pap 4/23. Having issue with nausea, vomiting bile only, loose stools and diarrhea for a couple wks. No pain with BM, no blood in stool. Has decreased appetite, not able to eat much; food increases sx. Also with hot flashes/night sweats. No fevers. Trying to drink water but can't keep it down. Went to ED twice (09/30/22 and 10/01/22) with neg labs. Pt had increased her MJ use and did a THC drink before sx started. Told to stop MJ by ED and pt quit yesterday. Pt also with increased fatigue for the past few wks. Sleeps 8 hrs and still tired. No prior sickness, foreign travel.  Wt was 165# in 11/23; has had wt loss.   Patient Active Problem List   Diagnosis Date Noted   Postpartum care following cesarean delivery 07/21/2021   Marijuana smoker 04/12/2021   Perirectal abscess     Past Surgical History:  Procedure Laterality Date   CESAREAN SECTION N/A 11/12/2019   Procedure: CESAREAN SECTION;  Surgeon: Conard Novak, MD;  Location: [facility] ORS;  Service: Obstetrics;  Laterality: N/A;   CESAREAN SECTION  07/19/2021   Procedure: CESAREAN SECTION;  Surgeon: Hildred Laser, MD;  Location: [facility] ORS;  Service: Obstetrics;;   INCISION AND DRAINAGE PERIRECTAL ABSCESS N/A 01/21/2016   Procedure: IRRIGATION AND DEBRIDEMENT PERIRECTAL ABSCESS;  Surgeon: Leafy Ro, MD;  Location: [facility] ORS;  Service: General;  Laterality: N/A;    Family History   Problem Relation Age of Onset   Hypertension Mother    Heart disease Paternal Grandmother     Social History   Socioeconomic History   Marital status: Single    Spouse name: Not on file   Number of children: Not on file   Years of education: Not on file   Highest education level: Not on file  Occupational History   Occupation: Unemployed  Tobacco Use   Smoking status: Every Day    Packs/day: .25    Types: Cigarettes    Last attempt to quit: 11/26/2020    Years since quitting: 1.8   Smokeless tobacco: Never  Vaping Use   Vaping Use: Never used  Substance and Sexual Activity   Alcohol use: Yes    Comment: rarely   Drug use: Yes    Frequency: 14.0 times per week    Types: Marijuana    Comment: daily, planning to stop   Sexual activity: Yes    Birth control/protection: Injection  Other Topics Concern   Not on file  Social History Narrative   Not on file   Social Determinants of Health   Financial Resource Strain: Not on file  Food Insecurity: Not on file  Transportation Needs: Not on file  Physical Activity: Not on file  Stress: Not on file  Social Connections: Not on file  Intimate Partner Violence: Not At Risk (12/03/2020)   Humiliation, Afraid, Rape, and  Kick questionnaire    Fear of Current or Ex-Partner: No    Emotionally Abused: No    Physically Abused: No    Sexually Abused: No    Outpatient Medications Prior to Visit  Medication Sig Dispense Refill   aluminum-magnesium hydroxide-simethicone (MAALOX) 200-200-20 MG/5ML SUSP Take 30 mLs by mouth 4 (four) times daily -  before meals and at bedtime. (Patient not taking: Reported on 10/04/2022) 355 mL 0   famotidine (PEPCID) 20 MG tablet Take 1 tablet (20 mg total) by mouth 2 (two) times daily. (Patient not taking: Reported on 10/04/2022) 60 tablet 0   propranolol (INDERAL) 20 MG tablet Take 1 tablet (20 mg total) by mouth 3 (three) times daily. (Patient not taking: Reported on 10/04/2022) 21 tablet 0   No  facility-administered medications prior to visit.      ROS:  Review of Systems  Constitutional:  Positive for fatigue. Negative for fever.  Gastrointestinal:  Positive for diarrhea, nausea and vomiting. Negative for blood in stool and constipation.  Genitourinary:  Positive for vaginal bleeding. Negative for dyspareunia, dysuria, flank pain, frequency, hematuria, urgency, vaginal discharge and vaginal pain.  Musculoskeletal:  Negative for back pain.  Skin:  Negative for rash.   BREAST: No symptoms   OBJECTIVE:   Vitals:  BP 106/60   Ht 5\' 2"  (1.575 m)   Wt 136 lb (61.7 kg)   Breastfeeding No   BMI 24.87 kg/m   Physical Exam Vitals reviewed.  Constitutional:      Appearance: She is well-developed.  Pulmonary:     Effort: Pulmonary effort is normal.  Genitourinary:    General: Normal vulva.     Pubic Area: No rash.      Labia:        Right: No rash, tenderness or lesion.        Left: No rash, tenderness or lesion.      Vagina: Bleeding present. No vaginal discharge, erythema or tenderness.     Cervix: Normal.     Uterus: Normal. Not enlarged and not tender.      Adnexa: Right adnexa normal and left adnexa normal.       Right: No mass or tenderness.         Left: No mass or tenderness.    Musculoskeletal:        General: Normal range of motion.     Cervical back: Normal range of motion.  Skin:    General: Skin is warm and dry.  Neurological:     General: No focal deficit present.     Mental Status: She is alert and oriented to person, place, and time.  Psychiatric:        Mood and Affect: Mood normal.        Behavior: Behavior normal.        Thought Content: Thought content normal.        Judgment: Judgment normal.     Assessment/Plan: Breakthrough bleeding on depo provera--on 1st injection. Rule out STDs. Reassurance. Sx should improve. F/u prn.   Screening for STD (sexually transmitted disease) - Plan: Cervicovaginal ancillary only  Nausea - Plan:  ondansetron (ZOFRAN) 4 MG tablet; BRAT diet, sip drinks with electrolytes/ginger ale. Most likely due to MJ sickness. Has stopped MJ, can take time to resolve. Zofran prn.   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   Other fatigue--most likely dehydrated. Sx should improve once MJ sickness resolves.   Meds ordered this encounter  Medications   ondansetron (ZOFRAN) 4  MG tablet    Sig: Take 1 tablet (4 mg total) by mouth every 8 (eight) hours as needed for nausea or vomiting.    Dispense:  20 tablet    Refill:  1    Order Specific Question:   Supervising Provider    Answer:   Hildred Laser [[id number]]      Return if symptoms worsen or fail to improve.  Anasha Perfecto B. Ellakate Gonsalves, PA-C 10/04/2022 4:21 PM

## 2022-10-04 NOTE — Patient Instructions (Signed)
I value your feedback and you entrusting us with your care. If you get a [facility] patient survey, I would appreciate you taking the time to let us know about your experience today. Thank you! ? ? ?

## 2022-10-06 LAB — CERVICOVAGINAL ANCILLARY ONLY
Chlamydia: NEGATIVE
Comment: NEGATIVE
Comment: NORMAL
Neisseria Gonorrhea: NEGATIVE

## 2022-10-07 ENCOUNTER — Ambulatory Visit: Payer: Medicaid Other | Admitting: Advanced Practice Midwife

## 2022-10-10 ENCOUNTER — Ambulatory Visit: Payer: Medicaid Other

## 2022-10-10 DIAGNOSIS — Z3042 Encounter for surveillance of injectable contraceptive: Secondary | ICD-10-CM

## 2022-10-19 ENCOUNTER — Ambulatory Visit (INDEPENDENT_AMBULATORY_CARE_PROVIDER_SITE_OTHER): Payer: Medicaid Other

## 2022-10-19 VITALS — BP 130/78 | HR 94 | Ht 62.0 in | Wt 134.0 lb

## 2022-10-19 DIAGNOSIS — Z3042 Encounter for surveillance of injectable contraceptive: Secondary | ICD-10-CM

## 2022-10-19 MED ORDER — MEDROXYPROGESTERONE ACETATE 150 MG/ML IM SUSY
150.0000 mg | PREFILLED_SYRINGE | Freq: Once | INTRAMUSCULAR | Status: AC
Start: 2022-10-19 — End: 2022-10-19
  Administered 2022-10-19: 150 mg via INTRAMUSCULAR

## 2022-10-19 NOTE — Progress Notes (Signed)
    NURSE VISIT NOTE  Subjective:    Patient ID: Carrie [name], female    DOB: 1995-06-05, 27 y.o.   MRN: [id number]  HPI  Patient is a 27 y.o. G33P2002 female who presents for depo provera injection.   Objective:    BP 130/78   Pulse 94   Ht 5\' 2"  (1.575 m)   Wt 134 lb (60.8 kg)   LMP  (LMP Unknown)   Breastfeeding No   BMI 24.51 kg/m   Last Annual: Over due asked to make apt at check out.  Last pap: 09/01/21.Marland Kitchen Last Depo-Provera: 07/19/22. Side Effects if any: none. Serum HCG indicated? No . Depo-Provera 150 mg IM given by: Beverely Pace, CMA. Site: Left Deltoid    Assessment:   1. Encounter for Depo-Provera contraception      Plan:   Next appointment due between AUG 8-21    Loney Laurence, CMA

## 2022-11-06 ENCOUNTER — Emergency Department: Payer: Medicaid Other

## 2022-11-06 ENCOUNTER — Emergency Department
Admission: EM | Admit: 2022-11-06 | Discharge: 2022-11-06 | Disposition: A | Discharge status: Home / Self Care | Payer: Medicaid Other | Attending: Emergency Medicine | Admitting: Emergency Medicine

## 2022-11-06 DIAGNOSIS — R519 Headache, unspecified: Secondary | ICD-10-CM | POA: Insufficient documentation

## 2022-11-06 DIAGNOSIS — R002 Palpitations: Secondary | ICD-10-CM | POA: Insufficient documentation

## 2022-11-06 DIAGNOSIS — R079 Chest pain, unspecified: Secondary | ICD-10-CM | POA: Diagnosis present

## 2022-11-06 DIAGNOSIS — R3915 Urgency of urination: Secondary | ICD-10-CM | POA: Insufficient documentation

## 2022-11-06 DIAGNOSIS — M545 Low back pain, unspecified: Secondary | ICD-10-CM | POA: Insufficient documentation

## 2022-11-06 LAB — BASIC METABOLIC PANEL
Anion gap: 10 (ref 5–15)
BUN: 9 mg/dL (ref 6–20)
CO2: 23 mmol/L (ref 22–32)
Calcium: 9.4 mg/dL (ref 8.9–10.3)
Chloride: 104 mmol/L (ref 98–111)
Creatinine, Ser: 0.85 mg/dL (ref 0.44–1.00)
GFR, Estimated: >60 mL/min (ref >60)
Glucose, Bld: 120 mg/dL — ABNORMAL HIGH (ref 70–99)
Potassium: 3.6 mmol/L (ref 3.5–5.1)
Sodium: 137 mmol/L (ref 135–145)

## 2022-11-06 LAB — URINALYSIS, ROUTINE W REFLEX MICROSCOPIC
Bilirubin Urine: NEGATIVE
Glucose, UA: NEGATIVE mg/dL
Hgb urine dipstick: NEGATIVE
Ketones, ur: NEGATIVE mg/dL
Leukocytes,Ua: NEGATIVE
Nitrite: NEGATIVE
Protein, ur: NEGATIVE mg/dL
Specific Gravity, Urine: 1.018 (ref 1.005–1.030)
pH: 7 (ref 5.0–8.0)

## 2022-11-06 LAB — CBC
HCT: 41.4 % (ref 36.0–46.0)
Hemoglobin: 13.8 g/dL (ref 12.0–15.0)
MCH: 30.1 pg (ref 26.0–34.0)
MCHC: 33.3 g/dL (ref 30.0–36.0)
MCV: 90.2 fL (ref 80.0–100.0)
Platelets: 310 10*3/uL (ref 150–400)
RBC: 4.59 MIL/uL (ref 3.87–5.11)
RDW: 13.5 % (ref 11.5–15.5)
WBC: 6.8 10*3/uL (ref 4.0–10.5)
nRBC: 0 % (ref 0.0–0.2)

## 2022-11-06 LAB — POC URINE PREG, ED: Preg Test, Ur: NEGATIVE

## 2022-11-06 LAB — TROPONIN I (HIGH SENSITIVITY): Troponin I (High Sensitivity): 3 ng/L (ref <18)

## 2022-11-06 MED ORDER — IBUPROFEN 400 MG PO TABS
400.0000 mg | ORAL_TABLET | Freq: Once | ORAL | Status: AC
  Administered 2022-11-06: 400 mg via ORAL
  Filled 2022-11-06: qty 1

## 2022-11-06 NOTE — Discharge Instructions (Signed)
Your workup in the emergency department including your EKG, heart enzymes chest x-ray and urine sample was reassuring.  We do not know exactly what is causing your pain.  You can take Tylenol and ibuprofen for it.  Is your symptoms are worsening or becoming more persistent then please return to the emergency department.  Otherwise please follow-up with primary care.

## 2022-11-06 NOTE — ED Provider Notes (Signed)
[facility] Provider Note    Event Date/Time   First MD Initiated Contact with Patient 11/06/22 1727     (approximate)   History   Back Pain and Chest Pain   HPI  Analysia Amar Blumenthal is a 27 y.o. female with no significant past medical history who presents because of chest pain headache back pain.  Last several days patient has had intermittent which she describes as tightness across her chest.  Comes and goes last for seconds at a time is nonexertional nonpleuritic and she has no associated dyspnea diaphoresis.  She has also had some intermittent palpitations which do not necessarily correspond with her chest pain.  She also has pain in the mid to lower back which comes and goes.  Denies any clear exacerbating factors denies numbness tingling down the legs no fevers or chills denies cough. The patient denies hx of prior DVT/PE, unilateral leg pain/swelling, hormone use, recent surgery, hx of cancer, prolonged immobilization, or hemoptysis.  Patient also complains of headache chest since coming to the emergency department.  Denies associated vision change numbness tingling weakness or neck pain.  Has had some intermittent head pain over the last several days.  Patient does endorse some urinary urgency but denies dysuria or frequency.     Past Medical History:  Diagnosis Date   Elevated glucose tolerance test 05/11/2021   12/13 notified of need for 3 hr testing Passed 3 hour testing.   Headache    Heart murmur    as a baby   History of cesarean delivery affecting pregnancy 02/15/2021   Labor and delivery, indication for care 07/19/2021   Pneumonia    as a baby   Supervision of normal pregnancy 01/20/2021    Nursing Staff Provider Office Location  [facility] Dating   CW LMP Language  English Anatomy US  Normal female Flu Vaccine   Genetic Screen  NIPS: normal xy TDaP vaccine   06/07/2020 Hgb A1C or  GTT Early : NA Third trimester : elevated but passed 3 hr test Covid     LAB RESULTS  Rhogam  NA Blood Type A/Positive/-- (08/22 1628)  Feeding Plan Breast Antibody Negative (08/22 1628) Contraception Nexplanon     Patient Active Problem List   Diagnosis Date Noted   Postpartum care following cesarean delivery 07/21/2021   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 --      Excl. in GC? --     Most recent vital signs: Vitals:   11/06/22 1626  BP: (!) 138/90  Pulse: 88  Temp: 98.9 F (37.2 C)  SpO2: 98%     General: Awake, no distress.  CV:  Good peripheral perfusion.  No peripheral edema or leg asymmetry Resp:  Normal effort.  Lung sounds are clear Abd:  No distention.  Neuro:             Awake, Alert, Oriented x 3  Other:  No midline C, T or L-spine tenderness   ED Results / Procedures / Treatments  Labs (all labs ordered are listed, but only abnormal results are displayed) Labs Reviewed  BASIC METABOLIC PANEL - Abnormal; Notable for the following components:      Result Value   Glucose, Bld 120 (*)    All other components within normal limits  URINALYSIS, ROUTINE W REFLEX MICROSCOPIC - Abnormal; Notable for the following components:   Color, Urine YELLOW (*)    APPearance CLEAR (*)    All other components within normal limits  CBC  POC URINE PREG, ED  TROPONIN I (HIGH SENSITIVITY)     EKG  EKG shows sinus rhythm with right axis deviation, T wave inversions V3-V5  RADIOLOGY I reviewed and interpreted the CXR which does not show any acute cardiopulmonary process    PROCEDURES:  Critical Care  performed: No  Procedures  The patient is on the cardiac monitor to evaluate for evidence of arrhythmia and/or significant heart rate changes.   MEDICATIONS ORDERED IN ED: Medications  ibuprofen (ADVIL) tablet 400 mg (400 mg Oral Given 11/06/22 1839)     IMPRESSION / MDM / ASSESSMENT AND PLAN / ED COURSE  I reviewed the triage vital signs and the nursing notes.                              Patient's presentation is most consistent with acute complicated illness / injury requiring diagnostic workup.  Differential diagnosis includes, but is not limited to, musculoskeletal pain, viral illness, pneumonia, pericarditis/myocarditis, PE, ACS  Patient is a 27 year old female relatively healthy who presents today with several complaints.  For the last several days she has had intermittent headache chest pain and back pain.  The chest pain is across the center of her chest comes and last for seconds at a time and is nonexertional, nonpleuritic.  She has no pain currently.  She has no PE risk factors and is PERC negative with no tachycardia or hypoxia.  EKG does have T wave inversions in V3-V5.  Did have T wave inversion V3 on prior EKG but V4 and V5 were upright.  Troponin is negative.  Given patient's symptoms have been going on for several days feel that this sufficiently rules out ACS and patient has no risk factors and pain is very atypical.  Considered PE but pain is coming and going she is not short of breath is not pleuritic and she is PERC negative so feel that this is less likely.  Patient's back pain in his mid to low back comes and goes and is not lies to left or right and is no radicular symptoms.  No red flag symptoms for spinal epidural abscess including no fever no IV drug use.  Presentation is not consistent with cauda equina syndrome.  Patient was complaining some urinary urgency so did obtain urinalysis to evaluate for UTI as cause of her back pain and this is negative.  Patient was  given ibuprofen in the ED.  Given reassuring workup and low concern for any acute life-threatening pathology do think she is safe to be discharged.  Recommend PCP follow-up.  Discussed reasons to return to ED.       FINAL CLINICAL IMPRESSION(S) /  ED DIAGNOSES   Final diagnoses:  Midline low back pain without sciatica, unspecified chronicity  Chest pain, unspecified type     Rx / DC Orders   ED Discharge Orders     None        Note:  This document was prepared using Dragon voice recognition software and may include unintentional dictation errors.   Georga Hacking, MD 11/06/22 [phone]

## 2022-11-06 NOTE — ED Triage Notes (Signed)
Pt presents to the ED due to back and chest pain. Pt states its been going on for about a week. Pt states "its just not right". Pt denies nvd. Pt A&Ox4

## 2022-11-06 NOTE — ED Notes (Signed)
Patient ambulated with steady gait to hallway bathroom. NAD.

## 2022-11-19 ENCOUNTER — Emergency Department
Admission: EM | Admit: 2022-11-19 | Discharge: 2022-11-19 | Disposition: A | Discharge status: Home / Self Care | Payer: Medicaid Other | Attending: Emergency Medicine | Admitting: Emergency Medicine

## 2022-11-19 DIAGNOSIS — R682 Dry mouth, unspecified: Secondary | ICD-10-CM

## 2022-11-19 DIAGNOSIS — H04129 Dry eye syndrome of unspecified lacrimal gland: Secondary | ICD-10-CM | POA: Insufficient documentation

## 2022-11-19 LAB — CBC
HCT: 39.9 % (ref 36.0–46.0)
Hemoglobin: 13.2 g/dL (ref 12.0–15.0)
MCH: 29.7 pg (ref 26.0–34.0)
MCHC: 33.1 g/dL (ref 30.0–36.0)
MCV: 89.9 fL (ref 80.0–100.0)
Platelets: 311 10*3/uL (ref 150–400)
RBC: 4.44 MIL/uL (ref 3.87–5.11)
RDW: 13.5 % (ref 11.5–15.5)
WBC: 7.6 10*3/uL (ref 4.0–10.5)
nRBC: 0 % (ref 0.0–0.2)

## 2022-11-19 LAB — BASIC METABOLIC PANEL
Anion gap: 9 (ref 5–15)
BUN: 14 mg/dL (ref 6–20)
CO2: 22 mmol/L (ref 22–32)
Calcium: 9 mg/dL (ref 8.9–10.3)
Chloride: 102 mmol/L (ref 98–111)
Creatinine, Ser: 0.74 mg/dL (ref 0.44–1.00)
GFR, Estimated: >60 mL/min (ref >60)
Glucose, Bld: 133 mg/dL — ABNORMAL HIGH (ref 70–99)
Potassium: 3.3 mmol/L — ABNORMAL LOW (ref 3.5–5.1)
Sodium: 133 mmol/L — ABNORMAL LOW (ref 135–145)

## 2022-11-19 MED ORDER — SODIUM CHLORIDE 0.9 % IV BOLUS
1000.0000 mL | Freq: Once | INTRAVENOUS | Status: AC
  Administered 2022-11-19: 1000 mL via INTRAVENOUS

## 2022-11-19 NOTE — ED Provider Notes (Signed)
[facility] Provider Note  Patient Contact: 7:23 PM (approximate)   History   Dehydration   HPI  Carrie [name] is a 27 y.o. female who presents to the emergency department complaining of dry mouth, dry eyes and feels like she is dehydrated.  Patient has not had any febrile illnesses.  Denies any difficulty maintaining fluid hydration and is currently drinking water in the ED.  States that she does not drink a lot of caffeine.     Physical Exam   Triage Vital Signs: ED Triage Vitals [11/19/22 1809]  Enc Vitals Group     BP 138/82     Pulse Rate 93     Resp 17     Temp 98.7 F (37.1 C)     Temp Source Oral     SpO2 100 %     Weight 130 lb (59 kg)     Height 5\' 2"  (1.575 m)     Head Circumference      Peak Flow      Pain Score 6     Pain Loc      Pain Edu?      Excl. in GC?     Most recent vital signs: Vitals:   11/19/22 1809  BP: 138/82  Pulse: 93  Resp: 17  Temp: 98.7 F (37.1 C)  SpO2: 100%     General: Alert and in no acute distress. ENT:      Ears:       Nose: No congestion/rhinnorhea.      Mouth/Throat: Mucous membranes are moist. Neck: No stridor. No cervical spine tenderness to palpation.  Cardiovascular:  Good peripheral perfusion Respiratory: Normal respiratory effort without tachypnea or retractions. Lungs CTAB. Musculoskeletal: Full range of motion to all extremities.  Neurologic:  No gross focal neurologic deficits are appreciated.  Skin:   No rash noted Other:   ED Results / Procedures / Treatments   Labs (all labs ordered are listed, but only abnormal results are displayed) Labs Reviewed  BASIC METABOLIC PANEL - Abnormal; Notable for the following components:      Result Value   Sodium 133 (*)    Potassium 3.3 (*)    Glucose, Bld 133 (*)    All other components within normal limits  CBC     EKG     RADIOLOGY    No results found.  PROCEDURES:  Critical Care performed:  No  Procedures   MEDICATIONS ORDERED IN ED: Medications  sodium chloride 0.9 % bolus 1,000 mL (1,000 mLs Intravenous New Bag/Given 11/19/22 2014)     IMPRESSION / MDM / ASSESSMENT AND PLAN / ED COURSE  I reviewed the triage vital signs and the nursing notes.                                 Differential diagnosis includes, but is not limited to, dehydration, feared complaint without diagnosis, electrolyte abnormality   Patient's presentation is most consistent with acute presentation with potential threat to life or bodily function.   Patient's diagnosis is consistent with dry mouth.  Patient presents emergency department complaining of possible dehydration.  She states that she feels dehydrated because her mouth was dry.  Patient was given fluids while we obtained labs.  There is no indication of dehydration on physical exam or labs.  Patient feels better after a liter of fluid and is safe for discharge at this  time.  No indication for further workup..  Patient is given ED precautions to return to the ED for any worsening or new symptoms.     FINAL CLINICAL IMPRESSION(S) / ED DIAGNOSES   Final diagnoses:  Dry mouth     Rx / DC Orders   ED Discharge Orders     None        Note:  This document was prepared using Dragon voice recognition software and may include unintentional dictation errors.   Lanette Hampshire 11/19/22 2144    Georga Hacking, MD 11/20/22 [phone]

## 2022-11-19 NOTE — ED Triage Notes (Signed)
Pt sts that she feels dehydrated due to her mouth being dry and her eyes are burning.

## 2022-11-29 ENCOUNTER — Other Ambulatory Visit: Payer: Self-pay

## 2022-11-29 ENCOUNTER — Emergency Department: Payer: Medicaid Other

## 2022-11-29 ENCOUNTER — Encounter: Payer: Self-pay | Admitting: Emergency Medicine

## 2022-11-29 ENCOUNTER — Emergency Department
Admission: EM | Admit: 2022-11-29 | Discharge: 2022-11-29 | Disposition: A | Discharge status: Home / Self Care | Payer: Medicaid Other | Attending: Emergency Medicine | Admitting: Emergency Medicine

## 2022-11-29 DIAGNOSIS — K219 Gastro-esophageal reflux disease without esophagitis: Secondary | ICD-10-CM | POA: Insufficient documentation

## 2022-11-29 DIAGNOSIS — R1013 Epigastric pain: Secondary | ICD-10-CM | POA: Diagnosis present

## 2022-11-29 DIAGNOSIS — R1011 Right upper quadrant pain: Secondary | ICD-10-CM

## 2022-11-29 LAB — CBC
HCT: 36.9 % (ref 36.0–46.0)
Hemoglobin: 11.9 g/dL — ABNORMAL LOW (ref 12.0–15.0)
MCH: 29.9 pg (ref 26.0–34.0)
MCHC: 32.2 g/dL (ref 30.0–36.0)
MCV: 92.7 fL (ref 80.0–100.0)
Platelets: 272 10*3/uL (ref 150–400)
RBC: 3.98 MIL/uL (ref 3.87–5.11)
RDW: 13.7 % (ref 11.5–15.5)
WBC: 10.3 10*3/uL (ref 4.0–10.5)
nRBC: 0 % (ref 0.0–0.2)

## 2022-11-29 LAB — HEPATIC FUNCTION PANEL
ALT: 22 U/L (ref 0–44)
AST: 17 U/L (ref 15–41)
Albumin: 4.2 g/dL (ref 3.5–5.0)
Alkaline Phosphatase: 45 U/L (ref 38–126)
Bilirubin, Direct: <0.1 mg/dL (ref 0.0–0.2)
Total Bilirubin: 0.6 mg/dL (ref 0.3–1.2)
Total Protein: 7.2 g/dL (ref 6.5–8.1)

## 2022-11-29 LAB — BASIC METABOLIC PANEL
Anion gap: 9 (ref 5–15)
BUN: 25 mg/dL — ABNORMAL HIGH (ref 6–20)
CO2: 21 mmol/L — ABNORMAL LOW (ref 22–32)
Calcium: 9.3 mg/dL (ref 8.9–10.3)
Chloride: 110 mmol/L (ref 98–111)
Creatinine, Ser: 0.79 mg/dL (ref 0.44–1.00)
GFR, Estimated: >60 mL/min (ref >60)
Glucose, Bld: 99 mg/dL (ref 70–99)
Potassium: 3.9 mmol/L (ref 3.5–5.1)
Sodium: 140 mmol/L (ref 135–145)

## 2022-11-29 LAB — LIPASE, BLOOD: Lipase: 46 U/L (ref 11–51)

## 2022-11-29 LAB — POC URINE PREG, ED
Preg Test, Ur: NEGATIVE
Preg Test, Ur: NEGATIVE

## 2022-11-29 LAB — TROPONIN I (HIGH SENSITIVITY): Troponin I (High Sensitivity): 3 ng/L (ref <18)

## 2022-11-29 MED ORDER — PANTOPRAZOLE SODIUM 40 MG PO TBEC: 40.0000 mg | DELAYED_RELEASE_TABLET | Freq: Every day | ORAL | 1 refills | Status: DC

## 2022-11-29 MED ORDER — ALUM & MAG HYDROXIDE-SIMETH 200-200-20 MG/5ML PO SUSP
30.0000 mL | Freq: Once | ORAL | Status: AC
  Administered 2022-11-29: 30 mL via ORAL
  Filled 2022-11-29: qty 30

## 2022-11-29 NOTE — ED Provider Notes (Signed)
[facility] Provider Note    Event Date/Time   First MD Initiated Contact with Patient 11/29/22 2003     (approximate)   History   Chest Pain   HPI  Carrie [name] is a 27 y.o. female with history of headaches, CHS, presents emergency department complaining of epigastric pain and muscle aches x 1 month.  Patient states she withdrew from marijuana about 46 days ago.  She has been clean since then.  States is continue to have body aches and right upper quadrant pain along with some reflux and heartburn.  Felt like chest pain.  No shortness of breath.  No fever or chills.      Physical Exam   Triage Vital Signs: ED Triage Vitals  Enc Vitals Group     BP 11/29/22 1919 (!) 141/74     Pulse Rate 11/29/22 1919 98     Resp 11/29/22 1919 15     Temp 11/29/22 1919 98.3 F (36.8 C)     Temp Source 11/29/22 1919 Oral     SpO2 11/29/22 1919 98 %     Weight 11/29/22 1914 130 lb (59 kg)     Height 11/29/22 1914 5\' 2"  (1.575 m)     Head Circumference --      Peak Flow --      Pain Score 11/29/22 1914 8     Pain Loc --      Pain Edu? --      Excl. in GC? --     Most recent vital signs: Vitals:   11/29/22 2000 11/29/22 2030  BP: 124/73 137/79  Pulse: 82 (!) 104  Resp: (!) 24 (!) 24  Temp:    SpO2: 100% 100%     General: Awake, no distress.   CV:  Good peripheral perfusion. regular rate and  rhythm Resp:  Normal effort. Lungs CTA Abd:  No distention.  Tender in the epigastric and right upper quadrant Other:      ED Results / Procedures / Treatments   Labs (all labs ordered are listed, but only abnormal results are displayed) Labs Reviewed  BASIC METABOLIC PANEL - Abnormal; Notable for the following components:      Result Value   CO2 21 (*)    BUN 25 (*)    All other components within normal limits  CBC - Abnormal; Notable for the following components:   Hemoglobin 11.9 (*)    All other components within normal limits  HEPATIC FUNCTION  PANEL  LIPASE, BLOOD  POC URINE PREG, ED  POC URINE PREG, ED  TROPONIN I (HIGH SENSITIVITY)     EKG  EKG   RADIOLOGY Chest x-ray    PROCEDURES:   Procedures   MEDICATIONS ORDERED IN ED: Medications  alum & mag hydroxide-simeth (MAALOX/MYLANTA) 200-200-20 MG/5ML suspension 30 mL (30 mLs Oral Given 11/29/22 2044)     IMPRESSION / MDM / ASSESSMENT AND PLAN / ED COURSE  I reviewed the triage vital signs and the nursing notes.                              Differential diagnosis includes, but is not limited to, acute cholecystitis, pancreatitis, GERD, MI  Patient's presentation is most consistent with acute presentation with potential threat to life or bodily function.   Labs are reassuring, due to the epigastric pain I did add a hepatic panel and lipase  Ultrasound right upper quadrant  EKG shows normal sinus rhythm, no STEMI, no ST elevation, see physician read  Chest x-ray was independently reviewed and interpreted by me as being negative for any acute abnormality  Patient was given a GI cocktail to see if this helps with her symptoms.   Ultrasound right upper quadrant independently reviewed interpreted by me as being negative for any acute abnormality.  Radiologist does comment that the gallbladder is contracted.  Feel that the patient does not need any further workup at this time.  Vitals have returned to normal.  Heart rate had decreased to 87.  Feel that she needs to follow-up with GI.  If worsening return emergency department.  She is in agreement treatment plan.  Given a prescription for Protonix and discharged in stable condition.   FINAL CLINICAL IMPRESSION(S) / ED DIAGNOSES   Final diagnoses:  Gastroesophageal reflux disease, unspecified whether esophagitis present  RUQ pain     Rx / DC Orders   ED Discharge Orders          Ordered    pantoprazole (PROTONIX) 40 MG tablet  Daily        11/29/22 2141             Note:  This document was  prepared using Dragon voice recognition software and may include unintentional dictation errors.    Faythe Ghee, PA-C 11/29/22 2144    Sharman Cheek, MD 12/01/22 [phone]

## 2022-11-29 NOTE — ED Triage Notes (Signed)
Pt c/o centralized chest pain that started approx 1 month ago. Pain radiates into mid back and bilateral shoulder. Described as aching burning. No known exacerbating factors. Also c/o headaches.

## 2022-11-29 NOTE — ED Notes (Signed)
Pt unable to provide urine sample at this time 

## 2022-11-29 NOTE — Discharge Instructions (Signed)
Follow-up with Dr. Mart Piggs office.  Please call for an appointment.  Take the Protonix as prescribed.  If worsening please return emergency department

## 2022-12-21 ENCOUNTER — Other Ambulatory Visit: Payer: Self-pay

## 2022-12-21 ENCOUNTER — Emergency Department
Admission: EM | Admit: 2022-12-21 | Discharge: 2022-12-21 | Disposition: A | Discharge status: Home / Self Care | Payer: Medicaid Other | Source: Home / Self Care | Attending: Emergency Medicine | Admitting: Emergency Medicine

## 2022-12-21 DIAGNOSIS — B349 Viral infection, unspecified: Secondary | ICD-10-CM | POA: Diagnosis not present

## 2022-12-21 DIAGNOSIS — Z20822 Contact with and (suspected) exposure to covid-19: Secondary | ICD-10-CM | POA: Insufficient documentation

## 2022-12-21 DIAGNOSIS — R519 Headache, unspecified: Secondary | ICD-10-CM | POA: Diagnosis present

## 2022-12-21 LAB — RESP PANEL BY RT-PCR (RSV, FLU A&B, COVID)  RVPGX2
Influenza A by PCR: NEGATIVE
Influenza B by PCR: NEGATIVE
Resp Syncytial Virus by PCR: NEGATIVE
SARS Coronavirus 2 by RT PCR: NEGATIVE

## 2022-12-21 MED ORDER — ONDANSETRON 4 MG PO TBDP: 4.0000 mg | ORAL_TABLET | Freq: Three times a day (TID) | ORAL | 0 refills | Status: DC | PRN

## 2022-12-21 MED ORDER — PSEUDOEPH-BROMPHEN-DM 30-2-10 MG/5ML PO SYRP: 10.0000 mL | ORAL_SOLUTION | Freq: Four times a day (QID) | ORAL | 0 refills | Status: DC | PRN

## 2022-12-21 NOTE — ED Provider Notes (Signed)
[facility] Provider Note  Patient Contact: 4:03 PM (approximate)   History   Headache   HPI  Carrie [name] is a 27 y.o. female who presents the emergency department complaining of headache, body aches, chills.  Patient states that her hospitalist is a COVID, she has had symptoms for 1 day.  Patient denies any fever, congestion, sore throat or cough currently.  Patient with no chest pain, shortness of breath, GI symptoms.  No urinary complaints.     Physical Exam   Triage Vital Signs: ED Triage Vitals  Encounter Vitals Group     BP 12/21/22 1338 (!) 146/84     Systolic BP Percentile --      Diastolic BP Percentile --      Pulse Rate 12/21/22 1338 97     Resp 12/21/22 1338 16     Temp 12/21/22 1338 98.4 F (36.9 C)     Temp Source 12/21/22 1338 Oral     SpO2 12/21/22 1338 99 %     Weight 12/21/22 1339 131 lb (59.4 kg)     Height 12/21/22 1339 5\' 2"  (1.575 m)     Head Circumference --      Peak Flow --      Pain Score --      Pain Loc --      Pain Education --      Exclude from Growth Chart --     Most recent vital signs: Vitals:   12/21/22 1338  BP: (!) 146/84  Pulse: 97  Resp: 16  Temp: 98.4 F (36.9 C)  SpO2: 99%     General: Alert and in no acute distress. Head: No acute traumatic findings ENT:      Ears:       Nose: No congestion/rhinnorhea.      Mouth/Throat: Mucous membranes are moist. Neck: No stridor. No cervical spine tenderness to palpation. Hematological/Lymphatic/Immunilogical: No cervical lymphadenopathy. Cardiovascular:  Good peripheral perfusion Respiratory: Normal respiratory effort without tachypnea or retractions. Lungs CTAB. Good air entry to the bases with no decreased or absent breath sounds. Musculoskeletal: Full range of motion to all extremities.  Neurologic:  No gross focal neurologic deficits are appreciated.  Skin:   No rash noted Other:   ED Results / Procedures / Treatments   Labs (all labs  ordered are listed, but only abnormal results are displayed) Labs Reviewed  RESP PANEL BY RT-PCR (RSV, FLU A&B, COVID)  [id number]     EKG     RADIOLOGY    No results found.  PROCEDURES:  Critical Care performed: No  Procedures   MEDICATIONS ORDERED IN ED: Medications - No data to display   IMPRESSION / MDM / ASSESSMENT AND PLAN / ED COURSE  I reviewed the triage vital signs and the nursing notes.                                 Differential diagnosis includes, but is not limited to, viral illness, COVID, flu   Patient's presentation is most consistent with acute presentation with potential threat to life or bodily function.   Patient's diagnosis is consistent with viral illness.  Patient presents emergency department with flulike symptoms.  Patient has positive exposure to COVID.  Has had symptoms x 1 day.  Swab was negative but given the increasing COVID burden in the community, recent known exposure to a COVID-positive patient I suspect patient does  not fact have COVID.  At this time we will treat symptomatically.  Follow-up with primary care as needed.. Patient is given ED precautions to return to the ED for any worsening or new symptoms.     FINAL CLINICAL IMPRESSION(S) / ED DIAGNOSES   Final diagnoses:  Viral illness     Rx / DC Orders   ED Discharge Orders          Ordered    brompheniramine-pseudoephedrine-DM 30-2-10 MG/5ML syrup  4 times daily PRN        12/21/22 1609    ondansetron (ZOFRAN-ODT) 4 MG disintegrating tablet  Every 8 hours PRN        12/21/22 1609             Note:  This document was prepared using Dragon voice recognition software and may include unintentional dictation errors.   Lanette Hampshire 12/21/22 1610    Minna Antis, MD 12/21/22 1900

## 2022-12-21 NOTE — ED Triage Notes (Signed)
PT sts that her boss was dx with COVID. Pt sts that she is having body aches and a headache.

## 2023-01-08 NOTE — Progress Notes (Unsigned)
PCP:  Nira Retort   No chief complaint on file.    HPI:      Ms. Carrie [name] is a 27 y.o. [id number] whose LMP was No LMP recorded. Patient has had an injection., presents today for her annual examination.  Her menses are {norm/abn:715}, lasting {number: 22536} days.  Dysmenorrhea {dysmen:716}. She {does:18564} have intermenstrual bleeding.  Sex activity: {sex active: 315163}.  Last Pap: 09/01/21 Results were: no abnormalities  Hx of STDs: {STD hx:14358}  There is no FH of breast cancer. There is no FH of ovarian cancer. The patient {does:18564} do self-breast exams.  Tobacco use: {tob:20664} Alcohol use: {Alcohol:11675} No drug use.  Exercise: {exercise:31265}  She {does:18564} get adequate calcium and Vitamin D in her diet.  Patient Active Problem List   Diagnosis Date Noted   Postpartum care following cesarean delivery 07/21/2021   Marijuana smoker 04/12/2021   Perirectal abscess     Past Surgical History:  Procedure Laterality Date   CESAREAN SECTION N/A 11/12/2019   Procedure: CESAREAN SECTION;  Surgeon: Conard Novak, MD;  Location: [facility] ORS;  Service: Obstetrics;  Laterality: N/A;   CESAREAN SECTION  07/19/2021   Procedure: CESAREAN SECTION;  Surgeon: Hildred Laser, MD;  Location: [facility] ORS;  Service: Obstetrics;;   INCISION AND DRAINAGE PERIRECTAL ABSCESS N/A 01/21/2016   Procedure: IRRIGATION AND DEBRIDEMENT PERIRECTAL ABSCESS;  Surgeon: Leafy Ro, MD;  Location: [facility] ORS;  Service: General;  Laterality: N/A;    Family History  Problem Relation Age of Onset   Hypertension Mother    Heart disease Paternal Grandmother     Social History   Socioeconomic History   Marital status: Single    Spouse name: Not on file   Number of children: Not on file   Years of education: Not on file   Highest education level: Not on file  Occupational History   Occupation: Unemployed  Tobacco Use   Smoking status: Every Day    Current packs/day: 0.00     Types: Cigarettes    Last attempt to quit: 11/26/2020    Years since quitting: 2.1   Smokeless tobacco: Never  Vaping Use   Vaping status: Never Used  Substance and Sexual Activity   Alcohol use: Yes    Comment: rarely   Drug use: Yes    Frequency: 14.0 times per week    Types: Marijuana    Comment: daily, planning to stop   Sexual activity: Yes    Birth control/protection: Injection  Other Topics Concern   Not on file  Social History Narrative   Not on file   Social Determinants of Health   Financial Resource Strain: Patient Declined (12/27/2022)   Received from [facility]   Overall Financial Resource Strain (CARDIA)    Difficulty of Paying Living Expenses: Patient declined  Food Insecurity: Patient Declined (12/27/2022)   Received from [facility]   Hunger Vital Sign    Worried About Running Out of Food in the Last Year: Patient declined    Ran Out of Food in the Last Year: Patient declined  Transportation Needs: Patient Declined (12/27/2022)   Received from [facility]   PRAPARE - Transportation    In the past 12 months, has lack of transportation kept you from medical appointments or from getting medications?: Patient declined    Lack of Transportation (Non-Medical): Patient declined  Physical Activity: Not on file  Stress: Not on file  Social Connections: Not on  file  Intimate Partner Violence: Not At Risk (12/03/2020)   Humiliation, Afraid, Rape, and Kick questionnaire    Fear of Current or Ex-Partner: No    Emotionally Abused: No    Physically Abused: No    Sexually Abused: No     Current Outpatient Medications:    brompheniramine-pseudoephedrine-DM 30-2-10 MG/5ML syrup, Take 10 mLs by mouth 4 (four) times daily as needed., Disp: 200 mL, Rfl: 0   ondansetron (ZOFRAN-ODT) 4 MG disintegrating tablet, Take 1 tablet (4 mg total) by mouth every 8 (eight) hours as needed., Disp: 20 tablet, Rfl: 0   pantoprazole  (PROTONIX) 40 MG tablet, Take 1 tablet (40 mg total) by mouth daily., Disp: 30 tablet, Rfl: 1   propranolol (INDERAL) 20 MG tablet, Take 1 tablet (20 mg total) by mouth 3 (three) times daily. (Patient not taking: Reported on 10/04/2022), Disp: 21 tablet, Rfl: 0     ROS:  Review of Systems BREAST: No symptoms   Objective: There were no vitals taken for this visit.   OBGyn Exam  Results: No results found for this or any previous visit (from the past 24 hour(s)).  Assessment/Plan: No diagnosis found.  No orders of the defined types were placed in this encounter.            GYN counsel {counseling: 16159}     F/U  No follow-ups on file.  Jarret Torre B. Baelyn Doring, PA-C 01/08/2023 9:29 PM

## 2023-01-09 ENCOUNTER — Ambulatory Visit (INDEPENDENT_AMBULATORY_CARE_PROVIDER_SITE_OTHER): Payer: Medicaid Other | Admitting: Obstetrics and Gynecology

## 2023-01-09 ENCOUNTER — Encounter: Payer: Self-pay | Admitting: Obstetrics and Gynecology

## 2023-01-09 VITALS — BP 104/80 | Ht 62.0 in | Wt 135.0 lb

## 2023-01-09 DIAGNOSIS — Z01419 Encounter for gynecological examination (general) (routine) without abnormal findings: Secondary | ICD-10-CM

## 2023-01-09 DIAGNOSIS — Z3042 Encounter for surveillance of injectable contraceptive: Secondary | ICD-10-CM | POA: Diagnosis not present

## 2023-01-09 DIAGNOSIS — Z113 Encounter for screening for infections with a predominantly sexual mode of transmission: Secondary | ICD-10-CM

## 2023-01-09 MED ORDER — MEDROXYPROGESTERONE ACETATE 150 MG/ML IM SUSP
150.0000 mg | INTRAMUSCULAR | Status: AC
Start: 2023-01-09 — End: 2024-04-03
  Administered 2023-01-09: 150 mg via INTRAMUSCULAR

## 2023-01-09 NOTE — Patient Instructions (Signed)
I value your feedback and you entrusting us with your care. If you get a [facility] patient survey, I would appreciate you taking the time to let us know about your experience today. Thank you! ? ? ?

## 2023-04-03 ENCOUNTER — Ambulatory Visit: Payer: Medicaid Other

## 2023-04-03 VITALS — BP 107/75 | HR 102 | Ht 62.0 in | Wt 132.0 lb

## 2023-04-03 DIAGNOSIS — Z3042 Encounter for surveillance of injectable contraceptive: Secondary | ICD-10-CM

## 2023-04-03 MED ORDER — MEDROXYPROGESTERONE ACETATE 150 MG/ML IM SUSY
150.0000 mg | PREFILLED_SYRINGE | Freq: Once | INTRAMUSCULAR | Status: AC
  Administered 2023-04-03: 150 mg via INTRAMUSCULAR

## 2023-04-03 NOTE — Progress Notes (Signed)
    NURSE VISIT NOTE  Subjective:    Patient ID: TIYE HUWE, female    DOB: 1995-12-10, 27 y.o.   MRN: [id number]  HPI  Patient is a 27 y.o. G65P2002 female who presents for depo provera injection.   Objective:    BP 107/75   Pulse (!) 102   Ht 5\' 2"  (1.575 m)   Wt 132 lb (59.9 kg)   LMP  (LMP Unknown)   Breastfeeding Unknown   BMI 24.14 kg/m   Last Annual: 01/09/2023. Last pap: 09/01/2021. Last Depo-Provera: 01/09/2023. Side Effects if any: none. Serum HCG indicated? No . Depo-Provera 150 mg IM given by: Beverely Pace, CMA. Site: Left Deltoid    Assessment:   1. Encounter for Depo-Provera contraception      Plan:   Next appointment due between Jan 20-Feb 3    Loney Laurence, CMA

## 2023-05-04 ENCOUNTER — Emergency Department
Admission: EM | Admit: 2023-05-04 | Discharge: 2023-05-04 | Disposition: A | Discharge status: Home / Self Care | Payer: Medicaid Other | Attending: Emergency Medicine | Admitting: Emergency Medicine

## 2023-05-04 ENCOUNTER — Encounter: Payer: Self-pay | Admitting: Emergency Medicine

## 2023-05-04 ENCOUNTER — Emergency Department: Payer: Medicaid Other

## 2023-05-04 ENCOUNTER — Other Ambulatory Visit: Payer: Self-pay

## 2023-05-04 DIAGNOSIS — R0789 Other chest pain: Secondary | ICD-10-CM | POA: Insufficient documentation

## 2023-05-04 DIAGNOSIS — R079 Chest pain, unspecified: Secondary | ICD-10-CM

## 2023-05-04 LAB — BASIC METABOLIC PANEL
Anion gap: 8 (ref 5–15)
BUN: 18 mg/dL (ref 6–20)
CO2: 24 mmol/L (ref 22–32)
Calcium: 9 mg/dL (ref 8.9–10.3)
Chloride: 108 mmol/L (ref 98–111)
Creatinine, Ser: 0.86 mg/dL (ref 0.44–1.00)
GFR, Estimated: >60 mL/min (ref >60)
Glucose, Bld: 89 mg/dL (ref 70–99)
Potassium: 3.6 mmol/L (ref 3.5–5.1)
Sodium: 140 mmol/L (ref 135–145)

## 2023-05-04 LAB — CBC
HCT: 37.4 % (ref 36.0–46.0)
Hemoglobin: 12.4 g/dL (ref 12.0–15.0)
MCH: 31 pg (ref 26.0–34.0)
MCHC: 33.2 g/dL (ref 30.0–36.0)
MCV: 93.5 fL (ref 80.0–100.0)
Platelets: 260 10*3/uL (ref 150–400)
RBC: 4 MIL/uL (ref 3.87–5.11)
RDW: 13.2 % (ref 11.5–15.5)
WBC: 8.6 10*3/uL (ref 4.0–10.5)
nRBC: 0 % (ref 0.0–0.2)

## 2023-05-04 LAB — TROPONIN I (HIGH SENSITIVITY): Troponin I (High Sensitivity): <2 ng/L (ref <18)

## 2023-05-04 NOTE — Discharge Instructions (Signed)
Your workup was reassuring today.  Your blood work was normal, your EKG showed normal heart rate and rhythm.  Your chest x-ray was also normal.  You can take Tylenol and ibuprofen as needed for pain.  Return to the ED with any new or worsening symptoms.

## 2023-05-04 NOTE — ED Provider Notes (Signed)
[facility] Provider Note    Event Date/Time   First MD Initiated Contact with Patient 05/04/23 1547     (approximate)   History   Chest Pain   HPI  Carrie [name] is a 27 y.o. female with PMH of anxiety presents for evaluation of chest pain.  Patient states that this has been going on for couple months on and off.  She says that the pain began while she was at work yesterday.  It is in the middle of her chest and she says it sometimes radiates to her back.  She denies associated shortness of breath.  Patient states that she came to the ED just to make sure everything was okay.      Physical Exam   Triage Vital Signs: ED Triage Vitals  Encounter Vitals Group     BP 05/04/23 1443 132/80     Systolic BP Percentile --      Diastolic BP Percentile --      Pulse Rate 05/04/23 1443 88     Resp 05/04/23 1443 18     Temp 05/04/23 1443 98.6 F (37 C)     Temp Source 05/04/23 1443 Oral     SpO2 05/04/23 1443 100 %     Weight 05/04/23 1440 134 lb (60.8 kg)     Height 05/04/23 1440 5\' 2"  (1.575 m)     Head Circumference --      Peak Flow --      Pain Score 05/04/23 1440 6     Pain Loc --      Pain Education --      Exclude from Growth Chart --     Most recent vital signs: Vitals:   05/04/23 1443  BP: 132/80  Pulse: 88  Resp: 18  Temp: 98.6 F (37 C)  SpO2: 100%    General: Awake, no distress.  CV:  Good peripheral perfusion.  RRR. Resp:  Normal effort.  CTAB. Abd:  No distention.    ED Results / Procedures / Treatments   Labs (all labs ordered are listed, but only abnormal results are displayed) Labs Reviewed  RESP PANEL BY RT-PCR (RSV, FLU A&B, COVID)  RVPGX2  BASIC METABOLIC PANEL  CBC  POC URINE PREG, ED  TROPONIN I (HIGH SENSITIVITY)  TROPONIN I (HIGH SENSITIVITY)    EKG  ED provider interpretation: Normal sinus rhythm with no ST changes.  Vent. rate 88 BPM PR interval 148 ms QRS duration 80 ms QT/QTcB 356/430  ms P-R-T axes 59 89 44   RADIOLOGY  Chest x-ray obtained, I interpreted the images as well as reviewed the radiologist report which was negative for any acute cardiopulmonary abnormalities.  PROCEDURES:  Critical Care performed: No  Procedures   MEDICATIONS ORDERED IN ED: Medications - No data to display   IMPRESSION / MDM / ASSESSMENT AND PLAN / ED COURSE  I reviewed the triage vital signs and the nursing notes.                             27 year old female presents for evaluation of chest pain.  Vital signs are stable patient NAD on exam.  Differential diagnosis includes, but is not limited to, ACS, aortic dissection, pulmonary embolism, cardiac tamponade, pneumothorax, pneumonia, pericarditis, myocarditis, GI-related causes including esophagitis/gastritis, and musculoskeletal chest wall pain.    Patient's presentation is most consistent with acute presentation with potential threat to life or bodily  function.  CBC and BMP unremarkable.  Troponin is not elevated.  Chest x-ray was negative.  EKG shows normal sinus rhythm.  Work up is reassuring. Based on the [facility] rule I can rule out a PE.  I do not believe there is a life-threatening or emergent cause of patient's chest pain.  She was advised to follow-up with her primary care provider.  She is aware that she can return to the ED with any new or worsening symptoms.  She voiced understanding, all questions were answered and she is stable at discharge.      FINAL CLINICAL IMPRESSION(S) / ED DIAGNOSES   Final diagnoses:  Chest pain, unspecified type     Rx / DC Orders   ED Discharge Orders     None        Note:  This document was prepared using Dragon voice recognition software and may include unintentional dictation errors.   Cameron Ali, PA-C 05/04/23 1706    Minna Antis, MD 05/04/23 2249

## 2023-05-04 NOTE — ED Triage Notes (Signed)
Patient to ED via POV fro right sided chest pain that started yesterday while at work. Pt reports pain radiates into back. Denies cardiac hx.   Patient noted to be on phone during triage.

## 2023-06-19 ENCOUNTER — Ambulatory Visit: Payer: Medicaid Other

## 2023-09-19 ENCOUNTER — Emergency Department
Admission: EM | Admit: 2023-09-19 | Discharge: 2023-09-20 | Disposition: A | Discharge status: Home / Self Care | Attending: Emergency Medicine | Admitting: Emergency Medicine

## 2023-09-19 ENCOUNTER — Other Ambulatory Visit: Payer: Self-pay

## 2023-09-19 DIAGNOSIS — I509 Heart failure, unspecified: Secondary | ICD-10-CM | POA: Diagnosis not present

## 2023-09-19 DIAGNOSIS — R112 Nausea with vomiting, unspecified: Secondary | ICD-10-CM | POA: Insufficient documentation

## 2023-09-19 DIAGNOSIS — R109 Unspecified abdominal pain: Secondary | ICD-10-CM | POA: Diagnosis not present

## 2023-09-19 DIAGNOSIS — R197 Diarrhea, unspecified: Secondary | ICD-10-CM | POA: Diagnosis not present

## 2023-09-19 LAB — URINALYSIS, ROUTINE W REFLEX MICROSCOPIC
Bacteria, UA: NONE SEEN
Bilirubin Urine: NEGATIVE
Glucose, UA: NEGATIVE mg/dL
Ketones, ur: 20 mg/dL — AB
Leukocytes,Ua: NEGATIVE
Nitrite: NEGATIVE
Protein, ur: NEGATIVE mg/dL
RBC / HPF: 0 RBC/hpf (ref 0–5)
Specific Gravity, Urine: 1.032 — ABNORMAL HIGH (ref 1.005–1.030)
pH: 5 (ref 5.0–8.0)

## 2023-09-19 LAB — CBC
HCT: 44 % (ref 36.0–46.0)
Hemoglobin: 14.7 g/dL (ref 12.0–15.0)
MCH: 31 pg (ref 26.0–34.0)
MCHC: 33.4 g/dL (ref 30.0–36.0)
MCV: 92.8 fL (ref 80.0–100.0)
Platelets: 242 10*3/uL (ref 150–400)
RBC: 4.74 MIL/uL (ref 3.87–5.11)
RDW: 13.2 % (ref 11.5–15.5)
WBC: 7.3 10*3/uL (ref 4.0–10.5)
nRBC: 0 % (ref 0.0–0.2)

## 2023-09-19 LAB — COMPREHENSIVE METABOLIC PANEL WITH GFR
ALT: 18 U/L (ref 0–44)
AST: 23 U/L (ref 15–41)
Albumin: 4.1 g/dL (ref 3.5–5.0)
Alkaline Phosphatase: 52 U/L (ref 38–126)
Anion gap: 8 (ref 5–15)
BUN: 13 mg/dL (ref 6–20)
CO2: 22 mmol/L (ref 22–32)
Calcium: 9.2 mg/dL (ref 8.9–10.3)
Chloride: 109 mmol/L (ref 98–111)
Creatinine, Ser: 0.74 mg/dL (ref 0.44–1.00)
GFR, Estimated: >60 mL/min (ref >60)
Glucose, Bld: 108 mg/dL — ABNORMAL HIGH (ref 70–99)
Potassium: 3.8 mmol/L (ref 3.5–5.1)
Sodium: 139 mmol/L (ref 135–145)
Total Bilirubin: 0.7 mg/dL (ref 0.0–1.2)
Total Protein: 7.6 g/dL (ref 6.5–8.1)

## 2023-09-19 LAB — POC URINE PREG, ED: Preg Test, Ur: NEGATIVE

## 2023-09-19 LAB — LIPASE, BLOOD: Lipase: 31 U/L (ref 11–51)

## 2023-09-19 MED ORDER — ONDANSETRON HCL 4 MG/2ML IJ SOLN
4.0000 mg | Freq: Once | INTRAMUSCULAR | Status: AC
  Administered 2023-09-19: 4 mg via INTRAVENOUS
  Filled 2023-09-19: qty 2

## 2023-09-19 MED ORDER — SODIUM CHLORIDE 0.9 % IV BOLUS
1000.0000 mL | Freq: Once | INTRAVENOUS | Status: AC
  Administered 2023-09-19: 1000 mL via INTRAVENOUS

## 2023-09-19 MED ORDER — LORAZEPAM 2 MG/ML IJ SOLN
1.0000 mg | Freq: Once | INTRAMUSCULAR | Status: DC
  Filled 2023-09-19: qty 1

## 2023-09-19 NOTE — ED Provider Notes (Signed)
 [facility] Provider Note    Event Date/Time   First MD Initiated Contact with Patient 09/19/23 2126     (approximate)   History   Abdominal Pain   HPI  Carrie [name] is a 28 y.o. female with a history of anxiety and CHF who presents with nausea and vomiting for the last day, persistent course, nonbloody, associated with diffuse crampy abdominal pain as well as some diarrhea.  The patient states that this feels similar to prior episodes of cannabinoid hyperemesis.  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    I reviewed the past medical records.  The patient was most recently seen in the ED in December 2024 for chest pain.  She was seen at the [facility] clinic in January for chest discomfort as well.   Physical Exam   Triage Vital Signs: ED Triage Vitals  Encounter Vitals Group     BP 09/19/23 2123 124/83     Systolic BP Percentile --      Diastolic BP Percentile --      Pulse Rate 09/19/23 2123 98     Resp 09/19/23 2123 18     Temp 09/19/23 2123 98.8 F (37.1 C)     Temp src --      SpO2 09/19/23 2123 100 %     Weight 09/19/23 2122 140 lb (63.5 kg)     Height 09/19/23 2122 5\' 2"  (1.575 m)     Head Circumference --      Peak Flow --      Pain Score 09/19/23 2122 8     Pain Loc --      Pain Education --      Exclude from Growth Chart --     Most recent vital signs: Vitals:   09/19/23 2123  BP: 124/83  Pulse: 98  Resp: 18  Temp: 98.8 F (37.1 C)  SpO2: 100%     General: Awake, no distress.  CV:  Good peripheral perfusion.  Resp:  Normal effort.  Abd:  No distention.  Soft with mild diffuse discomfort but no focal tenderness. Other:  Dry mucous membranes.  No jaundice or scleral icterus.   ED Results / Procedures / Treatments   Labs (all labs ordered are listed, but only abnormal results are displayed) Labs Reviewed  COMPREHENSIVE METABOLIC PANEL WITH GFR - Abnormal; Notable for the following  components:      Result Value   Glucose, Bld 108 (*)    All other components within normal limits  URINALYSIS, ROUTINE W REFLEX MICROSCOPIC - Abnormal; Notable for the following components:   Color, Urine YELLOW (*)    APPearance CLEAR (*)    Specific Gravity, Urine 1.032 (*)    Hgb urine dipstick SMALL (*)    Ketones, ur 20 (*)    All other components within normal limits  LIPASE, BLOOD  CBC  POC URINE PREG, ED     EKG   RADIOLOGY   PROCEDURES:  Critical Care performed: No  Procedures   MEDICATIONS ORDERED IN ED: Medications  LORazepam  (ATIVAN ) injection 1 mg (0 mg Intravenous Hold 09/19/23 2239)  sodium chloride  0.9 % bolus 1,000 mL (1,000 mLs Intravenous Bolus from Bag 09/19/23 2238)  ondansetron  (ZOFRAN ) injection 4 mg (4 mg Intravenous Given 09/19/23 2238)     IMPRESSION / MDM / ASSESSMENT AND PLAN / ED COURSE  I reviewed the triage vital signs and the nursing notes.  28 year old female with PMH  as noted above presents with acute onset of nausea, vomiting, diarrhea, and crampy abdominal pain since yesterday.  Her vital signs are normal.  The abdomen is soft with no focal tenderness.  Differential diagnosis includes, but is not limited to, cannabinoid hyperemesis syndrome, foodborne illness, viral gastroenteritis.  We will give fluids, Zofran , Ativan , and reassess.  Patient's presentation is most consistent with acute complicated illness / injury requiring diagnostic workup.  ----------------------------------------- 11:11 PM on 09/19/2023 -----------------------------------------  Lab workup is unremarkable.  CBC shows no leukocytosis.  CMP shows no significant electrolyte abnormalities.  Lipase is normal.  The patient just  started receiving the medication and fluids.  I have signed her out to the oncoming ED physician Dr. Vallery Gavel.   FINAL CLINICAL IMPRESSION(S) / ED DIAGNOSES   Final diagnoses:  Nausea vomiting and diarrhea     Rx / DC Orders   ED  Discharge Orders     None        Note:  This document was prepared using Dragon voice recognition software and may include unintentional dictation errors.    Lind Repine, MD 09/19/23 2312

## 2023-09-19 NOTE — ED Provider Notes (Signed)
-----------------------------------------   11:17 PM on 09/19/2023 -----------------------------------------   Assumed care of patient.  Laboratory results unremarkable.  Will reassess after IV medications.  ----------------------------------------- 1:02 AM on 09/20/2023 -----------------------------------------   Patient tolerated ice chips without emesis.  Will discharge home with as needed Zofran .  Strict return precautions given.  Patient verbalizes understanding and agrees with plan of care.   Greyson Riccardi J, MD 09/20/23 [phone]

## 2023-09-19 NOTE — ED Triage Notes (Signed)
 Pt reports she developed abd pain an d nausea yesterday. Pt reports she has been dx with CHS in the past and had a relapse.

## 2023-09-20 MED ORDER — ONDANSETRON 4 MG PO TBDP: 4.0000 mg | ORAL_TABLET | Freq: Three times a day (TID) | ORAL | 0 refills | Status: AC | PRN

## 2023-09-20 NOTE — Discharge Instructions (Signed)
 You may take Zofran as needed for nausea/vomiting.  Clear liquids x 12 hours, then bland diet x 3 days, then slowly advance diet as tolerated.  Return to the ER for worsening symptoms, persistent vomiting, difficulty breathing or other concerns.

## 2023-10-17 ENCOUNTER — Ambulatory Visit (INDEPENDENT_AMBULATORY_CARE_PROVIDER_SITE_OTHER)

## 2023-10-17 VITALS — BP 127/78 | HR 81 | Wt 140.8 lb

## 2023-10-17 DIAGNOSIS — Z3042 Encounter for surveillance of injectable contraceptive: Secondary | ICD-10-CM

## 2023-10-17 DIAGNOSIS — Z3202 Encounter for pregnancy test, result negative: Secondary | ICD-10-CM

## 2023-10-17 LAB — POCT URINE PREGNANCY: Preg Test, Ur: NEGATIVE

## 2023-10-17 MED ORDER — MEDROXYPROGESTERONE ACETATE 150 MG/ML IM SUSP
150.0000 mg | Freq: Once | INTRAMUSCULAR | Status: AC
  Administered 2023-10-17: 150 mg via INTRAMUSCULAR

## 2023-10-17 NOTE — Progress Notes (Signed)
    NURSE VISIT NOTE  Subjective:    Patient ID: Carrie [name], female    DOB: [birth year], 28 y.o.   MRN: [id number]  HPI  Patient is a 28 y.o. G80P2002 female who presents for depo provera  injection.   Objective:    BP 127/78   Pulse 81   Wt 140 lb 12.8 oz (63.9 kg)   BMI 25.75 kg/m   Last Annual: 01/09/23. Last pap: 09/11/21. Last Depo-Provera : 04/03/23. Side Effects if any:NA. Serum HCG indicated? Yes, UPT, negative. Depo-Provera  150 mg IM given by: Alfrieda Antes, CMA. Site: Left Deltoid    Assessment:   1. Encounter for Depo-Provera  contraception      Plan:   Next appointment due between 01/02/24 and 01/16/24.    Vale Garrison, CMA

## 2023-10-18 ENCOUNTER — Other Ambulatory Visit: Payer: Self-pay

## 2023-10-18 ENCOUNTER — Encounter: Payer: Self-pay | Admitting: Emergency Medicine

## 2023-10-18 ENCOUNTER — Emergency Department
Admission: EM | Admit: 2023-10-18 | Discharge: 2023-10-18 | Disposition: A | Discharge status: Home / Self Care | Attending: Emergency Medicine | Admitting: Emergency Medicine

## 2023-10-18 DIAGNOSIS — R059 Cough, unspecified: Secondary | ICD-10-CM | POA: Diagnosis present

## 2023-10-18 DIAGNOSIS — J069 Acute upper respiratory infection, unspecified: Secondary | ICD-10-CM | POA: Insufficient documentation

## 2023-10-18 LAB — RESP PANEL BY RT-PCR (RSV, FLU A&B, COVID)  RVPGX2
Influenza A by PCR: NEGATIVE
Influenza B by PCR: NEGATIVE
Resp Syncytial Virus by PCR: NEGATIVE
SARS Coronavirus 2 by RT PCR: NEGATIVE

## 2023-10-18 LAB — GROUP A STREP BY PCR: Group A Strep by PCR: NOT DETECTED

## 2023-10-18 MED ORDER — PREDNISONE 20 MG PO TABS
60.0000 mg | ORAL_TABLET | Freq: Once | ORAL | Status: AC
Start: 2023-10-18 — End: 2023-10-18
  Administered 2023-10-18: 60 mg via ORAL
  Filled 2023-10-18: qty 3

## 2023-10-18 MED ORDER — LIDOCAINE VISCOUS HCL 2 % MT SOLN
15.0000 mL | Freq: Once | OROMUCOSAL | Status: AC
  Administered 2023-10-18: 15 mL via OROMUCOSAL
  Filled 2023-10-18: qty 15

## 2023-10-18 NOTE — Discharge Instructions (Signed)
 You can take Tylenol  and ibuprofen  to help with pain symptoms.  This is most likely a viral respiratory infection which should run its course in the next several days.

## 2023-10-18 NOTE — ED Triage Notes (Signed)
 Patient to ED via POV for sore throat and headache. Ongoing since Sunday night.

## 2023-10-18 NOTE — ED Provider Notes (Signed)
 [facility] Provider Note    Event Date/Time   First MD Initiated Contact with Patient 10/18/23 1936     (approximate)   History   Sore Throat   HPI Carrie [name] is a 28 y.o. female presenting today for sore throat.  Patient states over the past 3 days she has had cough, congestion, sore throat, body aches, and headaches.  She has only taken TheraFlu 1 time.  Denies any sick contacts.  No difficulty breathing or chest pain.  No nausea or vomiting.     Physical Exam   Triage Vital Signs: ED Triage Vitals  Encounter Vitals Group     BP 10/18/23 1801 138/87     Systolic BP Percentile --      Diastolic BP Percentile --      Pulse Rate 10/18/23 1801 90     Resp 10/18/23 1801 18     Temp 10/18/23 1801 99.2 F (37.3 C)     Temp Source 10/18/23 1801 Oral     SpO2 10/18/23 1801 100 %     Weight 10/18/23 1759 140 lb (63.5 kg)     Height 10/18/23 1759 5\' 2"  (1.575 m)     Head Circumference --      Peak Flow --      Pain Score 10/18/23 1759 6     Pain Loc --      Pain Education --      Exclude from Growth Chart --     Most recent vital signs: Vitals:   10/18/23 1801  BP: 138/87  Pulse: 90  Resp: 18  Temp: 99.2 F (37.3 C)  SpO2: 100%   I have reviewed the vital signs. General:  Awake, alert, no acute distress. Head:  Normocephalic, Atraumatic. EENT:  PERRL, EOMI, Oral mucosa pink and moist, Neck is supple.  Noticeable posterior oropharynx erythema without tonsillar swelling or exudates Cardiovascular: Regular rate, 2+ distal pulses. Respiratory:  Normal respiratory effort, symmetrical expansion, no distress.   Extremities:  Moving all four extremities through full ROM without pain.   Neuro:  Alert and oriented.  Interacting appropriately.   Skin:  Warm, dry, no rash.   Psych: Appropriate affect.    ED Results / Procedures / Treatments   Labs (all labs ordered are listed, but only abnormal results are displayed) Labs Reviewed  GROUP  A STREP BY PCR  RESP PANEL BY RT-PCR (RSV, FLU A&B, COVID)  RVPGX2     EKG    RADIOLOGY    PROCEDURES:  Critical Care performed: No  Procedures   MEDICATIONS ORDERED IN ED: Medications  predniSONE (DELTASONE) tablet 60 mg (has no administration in time range)  lidocaine  (XYLOCAINE ) 2 % viscous mouth solution 15 mL (has no administration in time range)     IMPRESSION / MDM / ASSESSMENT AND PLAN / ED COURSE  I reviewed the triage vital signs and the nursing notes.                              Differential diagnosis includes, but is not limited to, COVID, flu, RSV, other viral URI, strep throat  Patient's presentation is most consistent with acute complicated illness / injury requiring diagnostic workup.  Patient is a 28 year old female presenting today for viral URI type symptoms.  Strep test is negative.  Does have pretty significant posterior oropharynx erythema but no tonsillar swelling or exudates.  Negative for COVID, flu, RSV.  Symptoms most consistent with viral URI and will give 1 dose of prednisone as well as viscous lidocaine  to help with sore throat symptoms.  Otherwise safe for discharge with symptomatic management at home.     FINAL CLINICAL IMPRESSION(S) / ED DIAGNOSES   Final diagnoses:  Viral URI with cough     Rx / DC Orders   ED Discharge Orders     None        Note:  This document was prepared using Dragon voice recognition software and may include unintentional dictation errors.   Kandee Orion, MD 10/18/23 [phone]

## 2023-11-22 ENCOUNTER — Emergency Department
Admission: EM | Admit: 2023-11-22 | Discharge: 2023-11-22 | Disposition: A | Discharge status: Home / Self Care | Attending: Emergency Medicine | Admitting: Emergency Medicine

## 2023-11-22 DIAGNOSIS — K529 Noninfective gastroenteritis and colitis, unspecified: Secondary | ICD-10-CM | POA: Diagnosis not present

## 2023-11-22 DIAGNOSIS — R1084 Generalized abdominal pain: Secondary | ICD-10-CM | POA: Insufficient documentation

## 2023-11-22 DIAGNOSIS — R112 Nausea with vomiting, unspecified: Secondary | ICD-10-CM | POA: Diagnosis present

## 2023-11-22 LAB — CBC
HCT: 43.9 % (ref 36.0–46.0)
Hemoglobin: 14.6 g/dL (ref 12.0–15.0)
MCH: 29.9 pg (ref 26.0–34.0)
MCHC: 33.3 g/dL (ref 30.0–36.0)
MCV: 89.8 fL (ref 80.0–100.0)
Platelets: 315 10*3/uL (ref 150–400)
RBC: 4.89 MIL/uL (ref 3.87–5.11)
RDW: 13.4 % (ref 11.5–15.5)
WBC: 8.2 10*3/uL (ref 4.0–10.5)
nRBC: 0 % (ref 0.0–0.2)

## 2023-11-22 LAB — COMPREHENSIVE METABOLIC PANEL WITH GFR
ALT: 23 U/L (ref 0–44)
AST: 22 U/L (ref 15–41)
Albumin: 4.2 g/dL (ref 3.5–5.0)
Alkaline Phosphatase: 48 U/L (ref 38–126)
Anion gap: 10 (ref 5–15)
BUN: 17 mg/dL (ref 6–20)
CO2: 20 mmol/L — ABNORMAL LOW (ref 22–32)
Calcium: 9.5 mg/dL (ref 8.9–10.3)
Chloride: 110 mmol/L (ref 98–111)
Creatinine, Ser: 0.81 mg/dL (ref 0.44–1.00)
GFR, Estimated: >60 mL/min (ref >60)
Glucose, Bld: 101 mg/dL — ABNORMAL HIGH (ref 70–99)
Potassium: 3.7 mmol/L (ref 3.5–5.1)
Sodium: 140 mmol/L (ref 135–145)
Total Bilirubin: 1.1 mg/dL (ref 0.0–1.2)
Total Protein: 7.8 g/dL (ref 6.5–8.1)

## 2023-11-22 LAB — RESP PANEL BY RT-PCR (RSV, FLU A&B, COVID)  RVPGX2
Influenza A by PCR: NEGATIVE
Influenza B by PCR: NEGATIVE
Resp Syncytial Virus by PCR: NEGATIVE
SARS Coronavirus 2 by RT PCR: NEGATIVE

## 2023-11-22 LAB — LIPASE, BLOOD: Lipase: 25 U/L (ref 11–51)

## 2023-11-22 MED ORDER — ONDANSETRON HCL 4 MG/2ML IJ SOLN
4.0000 mg | Freq: Once | INTRAMUSCULAR | Status: AC
  Administered 2023-11-22: 4 mg via INTRAVENOUS
  Filled 2023-11-22: qty 2

## 2023-11-22 MED ORDER — KETOROLAC TROMETHAMINE 30 MG/ML IJ SOLN
30.0000 mg | Freq: Once | INTRAMUSCULAR | Status: AC
  Administered 2023-11-22: 30 mg via INTRAVENOUS
  Filled 2023-11-22: qty 1

## 2023-11-22 MED ORDER — SODIUM CHLORIDE 0.9 % IV BOLUS
1000.0000 mL | Freq: Once | INTRAVENOUS | Status: AC
  Administered 2023-11-22: 1000 mL via INTRAVENOUS

## 2023-11-22 MED ORDER — LORAZEPAM 2 MG/ML IJ SOLN: 1.0000 mg | Freq: Once | INTRAMUSCULAR | Status: DC

## 2023-11-22 MED ORDER — ONDANSETRON 4 MG PO TBDP: 4.0000 mg | ORAL_TABLET | Freq: Three times a day (TID) | ORAL | 0 refills | Status: AC | PRN

## 2023-11-22 NOTE — ED Triage Notes (Signed)
 Pt presents to the ED via POV from home for N/V/D, HA, generalized abd pain, and body aches that started yesterday. Pt states that she thinks that she may have food poisoning.

## 2023-11-22 NOTE — ED Provider Notes (Signed)
 [facility] Provider Note    Event Date/Time   First MD Initiated Contact with Patient 11/22/23 1242     (approximate)  History   Chief Complaint: Abdominal Pain  HPI  Carrie [name] is a 28 y.o. female with a past medical history of anxiety, presents to the emergency department for nausea vomiting and diarrhea.  According to the patient she ate dinner last night approximately 3 to 4 hours later began with nausea vomiting and diarrhea that lasted throughout the night.  Patient's husband is here for the same symptoms that started at the same time as well.  Patient denies any fever.  No urinary symptoms.  Physical Exam   Triage Vital Signs: ED Triage Vitals  Encounter Vitals Group     BP 11/22/23 1232 123/83     Girls Systolic BP Percentile --      Girls Diastolic BP Percentile --      Boys Systolic BP Percentile --      Boys Diastolic BP Percentile --      Pulse Rate 11/22/23 1232 89     Resp 11/22/23 1232 18     Temp 11/22/23 1232 97.8 F (36.6 C)     Temp Source 11/22/23 1232 Oral     SpO2 11/22/23 1232 100 %     Weight 11/22/23 1233 142 lb (64.4 kg)     Height 11/22/23 1233 5' 2 (1.575 m)     Head Circumference --      Peak Flow --      Pain Score 11/22/23 1233 9     Pain Loc --      Pain Education --      Exclude from Growth Chart --     Most recent vital signs: Vitals:   11/22/23 1232  BP: 123/83  Pulse: 89  Resp: 18  Temp: 97.8 F (36.6 C)  SpO2: 100%    General: Awake, no distress.  CV:  Good peripheral perfusion.  Regular rate and rhythm  Resp:  Normal effort.  Equal breath sounds bilaterally.  Abd:  No distention.  Soft, nontender.  No rebound or guarding.  No focal tenderness.  ED Results / Procedures / Treatments   MEDICATIONS ORDERED IN ED: Medications  sodium chloride  0.9 % bolus 1,000 mL (has no administration in time range)  ondansetron  (ZOFRAN ) injection 4 mg (has no administration in time range)  ketorolac   (TORADOL ) 30 MG/ML injection 30 mg (has no administration in time range)     IMPRESSION / MDM / ASSESSMENT AND PLAN / ED COURSE  I reviewed the triage vital signs and the nursing notes.  Patient's presentation is most consistent with acute presentation with potential threat to life or bodily function.  Patient presents emergency department for nausea vomiting diarrhea that started several hours after eating dinner last night.  Husband is here with the same symptoms.  Vital signs are reassuring.  Physical exam reassuring.  Largely benign abdomen.  Symptoms sound very suggestive of gastroenteritis.  We will check labs will IV hydrate treat with Zofran  and Toradol  we will continue to closely monitor.  Patient agreeable to plan of care.  Blood work has resulted showing a reassuring CBC chemistry normal LFTs lipase and a negative respiratory panel.  Patient has no urinary symptoms.  Patient is feeling much better after medications and fluids.  Highly suspect gastroenteritis as her husband has the same symptoms that started around the same time.  Will discharge with Zofran  to be  used as needed discussed supportive care as well as return precautions.  FINAL CLINICAL IMPRESSION(S) / ED DIAGNOSES   Gastroenteritis    Note:  This document was prepared using Dragon voice recognition software and may include unintentional dictation errors.   Dorothyann Drivers, MD 11/22/23 [phone]

## 2023-11-22 NOTE — ED Notes (Signed)
 Pt is requesting an IV, stating she is weak and needs fluids

## 2024-01-09 ENCOUNTER — Ambulatory Visit (INDEPENDENT_AMBULATORY_CARE_PROVIDER_SITE_OTHER): Payer: Self-pay

## 2024-01-09 VITALS — BP 125/80 | HR 79 | Ht 62.0 in | Wt 149.0 lb

## 2024-01-09 DIAGNOSIS — Z3042 Encounter for surveillance of injectable contraceptive: Secondary | ICD-10-CM

## 2024-01-09 MED ORDER — MEDROXYPROGESTERONE ACETATE 150 MG/ML IM SUSY
150.0000 mg | PREFILLED_SYRINGE | Freq: Once | INTRAMUSCULAR | Status: AC
  Administered 2024-01-09 (×2): 150 mg via INTRAMUSCULAR

## 2024-01-09 NOTE — Progress Notes (Addendum)
    NURSE VISIT NOTE  Subjective:    Patient ID: Carrie [name], female    DOB: July 21, 1995, 28 y.o.   MRN: [id number]  HPI  Patient is a 28 y.o. G30P2002 female who presents for depo provera  injection.   Objective:    Ht 5' 2 (1.575 m)   Wt 149 lb (67.6 kg)   BMI 27.25 kg/m   Last Annual: 01/09/23. Last pap: 09/11/21. Last Depo-Provera : 10/17/23. Side Effects if any: none. Serum HCG indicated? No . Depo-Provera  150 mg IM given by: Annalee Sanders, CMA. Site: Left Deltoid   Assessment:   1. Surveillance for Depo-Provera  contraception      Plan:   Next appointment due between oct 28-nov 11     Annalee VEAR Sanders, CMA

## 2024-01-11 ENCOUNTER — Emergency Department: Payer: Self-pay

## 2024-01-11 ENCOUNTER — Emergency Department
Admission: EM | Admit: 2024-01-11 | Discharge: 2024-01-11 | Disposition: A | Discharge status: Home / Self Care | Payer: Self-pay | Attending: Emergency Medicine | Admitting: Emergency Medicine

## 2024-01-11 ENCOUNTER — Other Ambulatory Visit: Payer: Self-pay

## 2024-01-11 DIAGNOSIS — R519 Headache, unspecified: Secondary | ICD-10-CM | POA: Insufficient documentation

## 2024-01-11 DIAGNOSIS — R0789 Other chest pain: Secondary | ICD-10-CM | POA: Insufficient documentation

## 2024-01-11 LAB — RESP PANEL BY RT-PCR (RSV, FLU A&B, COVID)  RVPGX2
Influenza A by PCR: NEGATIVE
Influenza B by PCR: NEGATIVE
Resp Syncytial Virus by PCR: NEGATIVE
SARS Coronavirus 2 by RT PCR: NEGATIVE

## 2024-01-11 LAB — CBC
HCT: 43.5 % (ref 36.0–46.0)
Hemoglobin: 14.3 g/dL (ref 12.0–15.0)
MCH: 30.4 pg (ref 26.0–34.0)
MCHC: 32.9 g/dL (ref 30.0–36.0)
MCV: 92.6 fL (ref 80.0–100.0)
Platelets: 305 K/uL (ref 150–400)
RBC: 4.7 MIL/uL (ref 3.87–5.11)
RDW: 13.7 % (ref 11.5–15.5)
WBC: 5.9 K/uL (ref 4.0–10.5)
nRBC: 0 % (ref 0.0–0.2)

## 2024-01-11 LAB — BASIC METABOLIC PANEL WITH GFR
Anion gap: 9 (ref 5–15)
BUN: 14 mg/dL (ref 6–20)
CO2: 22 mmol/L (ref 22–32)
Calcium: 9.4 mg/dL (ref 8.9–10.3)
Chloride: 109 mmol/L (ref 98–111)
Creatinine, Ser: 0.85 mg/dL (ref 0.44–1.00)
GFR, Estimated: >60 mL/min (ref >60)
Glucose, Bld: 126 mg/dL — ABNORMAL HIGH (ref 70–99)
Potassium: 3.5 mmol/L (ref 3.5–5.1)
Sodium: 140 mmol/L (ref 135–145)

## 2024-01-11 LAB — URINALYSIS, ROUTINE W REFLEX MICROSCOPIC
Bilirubin Urine: NEGATIVE
Glucose, UA: NEGATIVE mg/dL
Hgb urine dipstick: NEGATIVE
Ketones, ur: NEGATIVE mg/dL
Leukocytes,Ua: NEGATIVE
Nitrite: NEGATIVE
Protein, ur: NEGATIVE mg/dL
Specific Gravity, Urine: 1.028 (ref 1.005–1.030)
pH: 5 (ref 5.0–8.0)

## 2024-01-11 LAB — TROPONIN I (HIGH SENSITIVITY)
Troponin I (High Sensitivity): 3 ng/L (ref <18)
Troponin I (High Sensitivity): 3 ng/L (ref <18)

## 2024-01-11 LAB — POC URINE PREG, ED: Preg Test, Ur: NEGATIVE

## 2024-01-11 NOTE — ED Triage Notes (Signed)
 Pt to ED via POV from home. Pt ambulatory to triage with steady gait. Pt repots chest pressure/tightness, low back pain and HA x1 wk. Pt current everyday smoker

## 2024-01-11 NOTE — ED Provider Notes (Signed)
 [facility] Provider Note    Event Date/Time   First MD Initiated Contact with Patient 01/11/24 1304     (approximate)   History   Chest Pain and Headache   HPI:  Carrie [name] is a 28 y.o. female with a history of anxiety who presents with multiple symptoms over the last several days.  The patient has had a headache which is mainly in the front of her head but sometimes goes to the back.  It is sometimes associated with nausea.  It is intermittent and is not unilateral.  She has no associated photophobia.  She also reports some chest tightness in the last few days but no actual chest pain.  She does not feel short of breath.  She has some right-sided lower back pain as well but no urinary symptoms.  She denies any vomiting or diarrhea.  I reviewed the past medical records.  The patient's most recent outpatient encounter was with OB/GYN in 8/12 for Depo-Provera  conception surveillance.   Physical Exam   Triage Vital Signs: ED Triage Vitals [01/11/24 1048]  Encounter Vitals Group     BP (!) 141/80     Girls Systolic BP Percentile      Girls Diastolic BP Percentile      Boys Systolic BP Percentile      Boys Diastolic BP Percentile      Pulse Rate 85     Resp 18     Temp 99.6 F (37.6 C)     Temp Source Oral     SpO2 98 %     Weight 147 lb 11.3 oz (67 kg)     Height 5' 2 (1.575 m)     Head Circumference      Peak Flow      Pain Score 6     Pain Loc      Pain Education      Exclude from Growth Chart     Most recent vital signs: Vitals:   01/11/24 1048 01/11/24 1320  BP: (!) 141/80   Pulse: 85   Resp: 18   Temp: 99.6 F (37.6 C)   SpO2: 98% 98%     General: Awake, no distress.  CV:  Good peripheral perfusion.  Normal heart sounds. Resp:  Normal effort.  Lungs CTAB. Abd:  No distention.  Other:  EOMI.  PERRLA.  No photophobia.  Oropharynx clear.  Motor intact in all extremities.  Normal speech.  Moist mucous membranes.  No meningeal  signs.   ED Results / Procedures / Treatments   Labs (all labs ordered are listed, but only abnormal results are displayed) Labs Reviewed  BASIC METABOLIC PANEL WITH GFR - Abnormal; Notable for the following components:      Result Value   Glucose, Bld 126 (*)    All other components within normal limits  URINALYSIS, ROUTINE W REFLEX MICROSCOPIC - Abnormal; Notable for the following components:   Color, Urine YELLOW (*)    APPearance CLEAR (*)    All other components within normal limits  RESP PANEL BY RT-PCR (RSV, FLU A&B, COVID)  RVPGX2  CBC  POC URINE PREG, ED  TROPONIN I (HIGH SENSITIVITY)  TROPONIN I (HIGH SENSITIVITY)     EKG  ED ECG REPORT I, Waylon Cassis, the attending physician, personally viewed and interpreted this ECG.  Date: 01/11/2024 EKG Time: 1055 Rate: 90 Rhythm: normal sinus rhythm QRS Axis: normal Intervals: normal ST/T Wave abnormalities: Nonspecific T wave abnormalities Narrative  Interpretation: no evidence of acute ischemia    RADIOLOGY  Chest x-ray: I independently viewed and interpreted the images; there is no focal consolidation or edema   PROCEDURES:  Critical Care performed: No  Procedures   MEDICATIONS ORDERED IN ED: Medications - No data to display   IMPRESSION / MDM / ASSESSMENT AND PLAN / ED COURSE  I reviewed the triage vital signs and the nursing notes.  28 year old female with PMH as noted above presents with multiple complaints over the last several days including chest tightness, headache, and some low back pain.  Physical exam is unremarkable for acute findings.  Blood pressure is borderline elevated.  Just this morning the patient developed some nasal congestion and rhinorrhea.  Differential diagnosis includes, but is not limited to, viral URI, other viral syndrome, tension headache, migraine, other benign etiology.  Triage workup is unremarkable.  BMP and CBC show no acute findings.  Troponins are negative.   Urinalysis is negative.  Respiratory panel is negative.  Chest x-ray is clear.  EKG is nonischemic.  There is no indication for further ED workup.  The patient declines any medications.  She is stable for discharge home at this time.  I counseled her on the possible etiologies of her symptoms.  I gave strict return precautions, and she expressed understanding.  Patient's presentation is most consistent with acute complicated illness / injury requiring diagnostic workup.    FINAL CLINICAL IMPRESSION(S) / ED DIAGNOSES   Final diagnoses:  Acute nonintractable headache, unspecified headache type  Chest tightness     Rx / DC Orders   ED Discharge Orders     None        Note:  This document was prepared using Dragon voice recognition software and may include unintentional dictation errors.    Jacolyn Pae, MD 01/11/24 1428

## 2024-01-11 NOTE — Discharge Instructions (Signed)
 You may continue to take Tylenol  or ibuprofen  as needed for the headache.  You can also try taking Sudafed (pseudoephedrine, available over-the-counter at the pharmacy) if you have headache and congestion.  Return to the ER for new, worsening, or persistent severe headache, chest tightness, back or abdominal pain, weakness, fever, or any other new or worsening symptoms that concern you.

## 2024-01-13 ENCOUNTER — Emergency Department
Admission: EM | Admit: 2024-01-13 | Discharge: 2024-01-13 | Disposition: A | Discharge status: Home / Self Care | Payer: Self-pay | Attending: Emergency Medicine | Admitting: Emergency Medicine

## 2024-01-13 ENCOUNTER — Encounter: Payer: Self-pay | Admitting: Intensive Care

## 2024-01-13 ENCOUNTER — Other Ambulatory Visit: Payer: Self-pay

## 2024-01-13 DIAGNOSIS — R202 Paresthesia of skin: Secondary | ICD-10-CM | POA: Insufficient documentation

## 2024-01-13 DIAGNOSIS — R519 Headache, unspecified: Secondary | ICD-10-CM | POA: Insufficient documentation

## 2024-01-13 LAB — COMPREHENSIVE METABOLIC PANEL WITH GFR
ALT: 17 U/L (ref 0–44)
AST: 24 U/L (ref 15–41)
Albumin: 4.2 g/dL (ref 3.5–5.0)
Alkaline Phosphatase: 52 U/L (ref 38–126)
Anion gap: 12 (ref 5–15)
BUN: 13 mg/dL (ref 6–20)
CO2: 22 mmol/L (ref 22–32)
Calcium: 9.2 mg/dL (ref 8.9–10.3)
Chloride: 102 mmol/L (ref 98–111)
Creatinine, Ser: 0.95 mg/dL (ref 0.44–1.00)
GFR, Estimated: >60 mL/min (ref >60)
Glucose, Bld: 104 mg/dL — ABNORMAL HIGH (ref 70–99)
Potassium: 3.5 mmol/L (ref 3.5–5.1)
Sodium: 136 mmol/L (ref 135–145)
Total Bilirubin: 0.7 mg/dL (ref 0.0–1.2)
Total Protein: 7.7 g/dL (ref 6.5–8.1)

## 2024-01-13 LAB — CBC
HCT: 44.4 % (ref 36.0–46.0)
Hemoglobin: 14.6 g/dL (ref 12.0–15.0)
MCH: 30.4 pg (ref 26.0–34.0)
MCHC: 32.9 g/dL (ref 30.0–36.0)
MCV: 92.3 fL (ref 80.0–100.0)
Platelets: 286 K/uL (ref 150–400)
RBC: 4.81 MIL/uL (ref 3.87–5.11)
RDW: 13.2 % (ref 11.5–15.5)
WBC: 6.5 K/uL (ref 4.0–10.5)
nRBC: 0 % (ref 0.0–0.2)

## 2024-01-13 LAB — URINALYSIS, ROUTINE W REFLEX MICROSCOPIC
Bilirubin Urine: NEGATIVE
Glucose, UA: NEGATIVE mg/dL
Hgb urine dipstick: NEGATIVE
Ketones, ur: NEGATIVE mg/dL
Leukocytes,Ua: NEGATIVE
Nitrite: NEGATIVE
Protein, ur: NEGATIVE mg/dL
Specific Gravity, Urine: 1.003 — ABNORMAL LOW (ref 1.005–1.030)
pH: 7 (ref 5.0–8.0)

## 2024-01-13 LAB — LIPASE, BLOOD: Lipase: 34 U/L (ref 11–51)

## 2024-01-13 LAB — MAGNESIUM: Magnesium: 2.3 mg/dL (ref 1.7–2.4)

## 2024-01-13 MED ORDER — DIPHENHYDRAMINE HCL 50 MG/ML IJ SOLN
25.0000 mg | Freq: Once | INTRAMUSCULAR | Status: AC
  Administered 2024-01-13: 25 mg via INTRAVENOUS
  Filled 2024-01-13: qty 1

## 2024-01-13 MED ORDER — SODIUM CHLORIDE 0.9 % IV BOLUS
1000.0000 mL | Freq: Once | INTRAVENOUS | Status: AC
  Administered 2024-01-13: 1000 mL via INTRAVENOUS

## 2024-01-13 MED ORDER — METOCLOPRAMIDE HCL 5 MG/ML IJ SOLN
10.0000 mg | Freq: Once | INTRAMUSCULAR | Status: AC
  Administered 2024-01-13: 10 mg via INTRAVENOUS
  Filled 2024-01-13: qty 2

## 2024-01-13 NOTE — ED Provider Notes (Signed)
 [facility] Provider Note    Event Date/Time   First MD Initiated Contact with Patient 01/13/24 1827     (approximate)   History   Nausea and Headache   HPI  Carrie [name] is a 28 y.o. female with a history of anxiety who presents with multiple complaints over the last few days.  The symptoms worsened since yesterday.  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  She reports headache, frontal in location, dull in quality, associate with nausea and vomiting.  The patient states she feels irritable.  She also reports some tingling and cramping in her left hand.  She states that she feels dehydrated or like her electrolytes might be off.    I reviewed the past medical records.  I saw this patient in the ED 2 days ago also with a headache and nausea, with a negative workup.  Previously, the patient's most recent outpatient encounter was with OB/GYN in 8/12 for Depo-Provera  conception surveillance.    Physical Exam   Triage Vital Signs: ED Triage Vitals  Encounter Vitals Group     BP 01/13/24 1745 (!) 149/84     Girls Systolic BP Percentile --      Girls Diastolic BP Percentile --      Boys Systolic BP Percentile --      Boys Diastolic BP Percentile --      Pulse Rate 01/13/24 1745 99     Resp 01/13/24 1745 16     Temp 01/13/24 1745 99.4 F (37.4 C)     Temp Source 01/13/24 1745 Oral     SpO2 01/13/24 1745 100 %     Weight 01/13/24 1743 141 lb (64 kg)     Height 01/13/24 1743 5' 2 (1.575 m)     Head Circumference --      Peak Flow --      Pain Score 01/13/24 1743 4     Pain Loc --      Pain Education --      Exclude from Growth Chart --     Most recent vital signs: Vitals:   01/13/24 1745  BP: (!) 149/84  Pulse: 99  Resp: 16  Temp: 99.4 F (37.4 C)  SpO2: 100%    General: Alert, well-appearing, no distress.  CV:  Good peripheral perfusion.  Resp:  Normal effort.  Abd:  No distention.   Other:  EOMI.  Purulent.  No photophobia.  Normal speech.  Slightly dry mucous membranes.  Motor intact in all extremities.  5/5 motor strength and intact sensation of bilateral upper extremities.  No ataxia.   ED Results / Procedures / Treatments   Labs (all labs ordered are listed, but only abnormal results are displayed) Labs Reviewed  COMPREHENSIVE METABOLIC PANEL WITH GFR - Abnormal; Notable for the following components:      Result Value   Glucose, Bld 104 (*)    All other components within normal limits  URINALYSIS, ROUTINE W REFLEX MICROSCOPIC - Abnormal; Notable for the following components:   Color, Urine COLORLESS (*)    APPearance CLEAR (*)    Specific Gravity, Urine 1.003 (*)    All other components within normal limits  LIPASE, BLOOD  CBC  MAGNESIUM   POC URINE PREG, ED     EKG    RADIOLOGY    PROCEDURES:  Critical Care performed: No  Procedures   MEDICATIONS ORDERED IN ED: Medications  metoCLOPramide  (REGLAN ) injection 10 mg (has no administration in time range)  diphenhydrAMINE  (BENADRYL ) injection 25 mg (has no administration in time range)  sodium chloride  0.9 % bolus 1,000 mL (1,000 mLs Intravenous New Bag/Given 01/13/24 1921)     IMPRESSION / MDM / ASSESSMENT AND PLAN / ED COURSE  I reviewed the triage vital signs and the nursing notes.  28 year old female with PMH as noted above presents with headache, nausea, lightheadedness, and paresthesias in her left hand after discontinuing marijuana use 2 days ago.  The symptoms are overall similar to what she was seen in the ED with 2 days ago as well.  On exam her temperature is borderline elevated.  Other vitals are normal.  Physical exam is otherwise unremarkable for acute findings.  Differential diagnosis includes, but is not limited to, viral syndrome, symptoms due to discontinuing marijuana, dehydration, lecture light abnormality.  CMP shows no acute findings.  CBC is unremarkable.  Urinalysis is  clear.  I have added on a magnesium  and we will give a fluid bolus and migraine cocktail.  Patient's presentation is most consistent with acute complicated illness / injury requiring diagnostic workup.  ----------------------------------------- 7:31 PM on 01/13/2024 -----------------------------------------  Magnesium  is pending.  The patient versus receiving fluids now.  I have signed her out to the oncoming ED provider.  FINAL CLINICAL IMPRESSION(S) / ED DIAGNOSES   Final diagnoses:  Left hand paresthesia  Acute nonintractable headache, unspecified headache type     Rx / DC Orders   ED Discharge Orders     None        Note:  This document was prepared using Dragon voice recognition software and may include unintentional dictation errors.    Jacolyn Pae, MD 01/13/24 1931

## 2024-01-13 NOTE — ED Triage Notes (Signed)
 Pt c/o headache, nausea, vomiting and numbness in left hand. 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 Last smoked 33 hours ago

## 2024-01-13 NOTE — ED Provider Notes (Signed)
-----------------------------------------   7:42 PM on 01/13/2024 -----------------------------------------  Blood pressure (!) 149/84, pulse 99, temperature 99.4 F (37.4 C), temperature source Oral, resp. rate 16, height 5' 2 (1.575 m), weight 64 kg, SpO2 100%, not currently breastfeeding.  Assuming care from Dr. Jacolyn .  In short, Carrie Carrie [name] is Carrie [name] 28 y.o. female with Carrie [name] chief complaint of Nausea and Headache .  Refer to the original H&P for additional details.  The current plan of care is to reassess patient and make appropriate disposition.  ----------------------------------------- 8:13 PM on 01/13/2024 ----------------------------------------- On reassessment patient reports resolution of headache.  States she feels better.  Will complete IV fluids and discharge home.  She is in stable condition at this time.  Magnesium  level normal. Advised to follow up with PCP as needed.  ED return precautions discussed.            Carrie Carrie [name], Carrie Picker A, PA-C 01/13/24 2016    Jacolyn Pae, MD 01/18/24 2318

## 2024-01-13 NOTE — Discharge Instructions (Signed)
 Your symptoms are likely caused by viral infection, dehydration, or due to stopping the marijuana.  Make sure to drink plenty of fluids.  Follow-up with your primary care doctor.  Return to the ER for any new or worsening symptoms that concern you.

## 2024-02-20 ENCOUNTER — Other Ambulatory Visit: Payer: Self-pay

## 2024-02-20 ENCOUNTER — Emergency Department: Payer: Self-pay

## 2024-02-20 ENCOUNTER — Emergency Department
Admission: EM | Admit: 2024-02-20 | Discharge: 2024-02-20 | Disposition: A | Discharge status: Home / Self Care | Payer: Self-pay | Attending: Emergency Medicine | Admitting: Emergency Medicine

## 2024-02-20 DIAGNOSIS — R002 Palpitations: Secondary | ICD-10-CM | POA: Insufficient documentation

## 2024-02-20 HISTORY — DX: Prediabetes: R73.03

## 2024-02-20 LAB — BASIC METABOLIC PANEL WITH GFR
Anion gap: 10 (ref 5–15)
BUN: 21 mg/dL — ABNORMAL HIGH (ref 6–20)
CO2: 22 mmol/L (ref 22–32)
Calcium: 9.3 mg/dL (ref 8.9–10.3)
Chloride: 109 mmol/L (ref 98–111)
Creatinine, Ser: 0.93 mg/dL (ref 0.44–1.00)
GFR, Estimated: >60 mL/min (ref >60)
Glucose, Bld: 103 mg/dL — ABNORMAL HIGH (ref 70–99)
Potassium: 4.2 mmol/L (ref 3.5–5.1)
Sodium: 141 mmol/L (ref 135–145)

## 2024-02-20 LAB — TSH: TSH: 1.26 u[IU]/mL (ref 0.350–4.500)

## 2024-02-20 LAB — CBC
HCT: 42.5 % (ref 36.0–46.0)
Hemoglobin: 14.1 g/dL (ref 12.0–15.0)
MCH: 30.8 pg (ref 26.0–34.0)
MCHC: 33.2 g/dL (ref 30.0–36.0)
MCV: 92.8 fL (ref 80.0–100.0)
Platelets: 281 K/uL (ref 150–400)
RBC: 4.58 MIL/uL (ref 3.87–5.11)
RDW: 13.2 % (ref 11.5–15.5)
WBC: 8.6 K/uL (ref 4.0–10.5)
nRBC: 0 % (ref 0.0–0.2)

## 2024-02-20 LAB — TROPONIN I (HIGH SENSITIVITY): Troponin I (High Sensitivity): 3 ng/L (ref <18)

## 2024-02-20 LAB — POC URINE PREG, ED: Preg Test, Ur: NEGATIVE

## 2024-02-20 LAB — MAGNESIUM: Magnesium: 2.3 mg/dL (ref 1.7–2.4)

## 2024-02-20 NOTE — ED Triage Notes (Signed)
 Pt to ED for chest discomfort. States that 2 nights ago her heart was 'beating out of my chest'. Right now chest just feels 'tingly'. Pt has unlabored respirations, dry skin.

## 2024-02-20 NOTE — Discharge Instructions (Signed)
 Your tests in the ER today are all normal.  Please follow-up with cardiology for further evaluation of your palpitation symptoms.

## 2024-02-20 NOTE — ED Provider Notes (Signed)
 [facility] Provider Note    Event Date/Time   First MD Initiated Contact with Patient 02/20/24 [phone]     (approximate)   History   Chief Complaint: Chest Pain   HPI  Carrie [name] is a 28 y.o. female with history of anxiety who comes ED complaining of palpitations for the past week, off-and-on lasting about 3 minutes at a time, occurring about once a day.  No aggravating or alleviating factors, no shortness of breath chest pain or dizziness.  No syncope.  No temporal pattern throughout the day.  Symptoms seem to only occur when she is alone        Past Medical History:  Diagnosis Date   Anxiety    Elevated glucose tolerance test 05/11/2021   12/13 notified of need for 3 hr testing Passed 3 hour testing.   Headache    Heart murmur    as a baby   History of cesarean delivery affecting pregnancy 02/15/2021   Labor and delivery, indication for care 07/19/2021   Pneumonia    as a baby   Prediabetes    Supervision of normal pregnancy 01/20/2021    Nursing Staff Provider Office Location  [facility] Dating   CW LMP Language  English Anatomy US   Normal female Flu Vaccine   Genetic Screen  NIPS: normal xy TDaP vaccine   06/07/2020 Hgb A1C or  GTT Early : NA Third trimester : elevated but passed 3 hr test Covid    LAB RESULTS  Rhogam  NA Blood Type A/Positive/-- (08/22 1628)  Feeding Plan Breast Antibody Negative (08/22 1628) Contraception Nexplanon      Current Outpatient Rx   Order #: [id number] Class: Historical Med   Order #: [id number] Class: Print   Order #: [id number] Class: Normal    Past Surgical History:  Procedure Laterality Date   CESAREAN SECTION N/A 11/12/2019   Procedure: CESAREAN SECTION;  Surgeon: Leonce Garnette BIRCH, MD;  Location: [facility] ORS;  Service: Obstetrics;  Laterality: N/A;   CESAREAN SECTION  07/19/2021   Procedure: CESAREAN SECTION;  Surgeon: Connell Davies, MD;  Location: [facility] ORS;  Service: Obstetrics;;   INCISION AND DRAINAGE PERIRECTAL  ABSCESS N/A 01/21/2016   Procedure: IRRIGATION AND DEBRIDEMENT PERIRECTAL ABSCESS;  Surgeon: Laneta JULIANNA Luna, MD;  Location: [facility] ORS;  Service: General;  Laterality: N/A;    Physical Exam   Triage Vital Signs: ED Triage Vitals  Encounter Vitals Group     BP 02/20/24 0903 135/78     Girls Systolic BP Percentile --      Girls Diastolic BP Percentile --      Boys Systolic BP Percentile --      Boys Diastolic BP Percentile --      Pulse Rate 02/20/24 0903 100     Resp 02/20/24 0903 20     Temp 02/20/24 0903 97.9 F (36.6 C)     Temp Source 02/20/24 0903 Oral     SpO2 02/20/24 0903 100 %     Weight 02/20/24 0902 140 lb (63.5 kg)     Height 02/20/24 0902 5' 2 (1.575 m)     Head Circumference --      Peak Flow --      Pain Score 02/20/24 0901 0     Pain Loc --      Pain Education --      Exclude from Growth Chart --     Most recent vital signs: Vitals:   02/20/24 0903  BP: 135/78  Pulse:  100  Resp: 20  Temp: 97.9 F (36.6 C)  SpO2: 100%    General: Awake, no distress.  CV:  Good peripheral perfusion.  Regular rate rhythm Resp:  Normal effort.  Clear lungs Abd:  No distention.  Soft nontender Other:  Moist oral mucosa.  Thyroid  nonpalpable   ED Results / Procedures / Treatments   Labs (all labs ordered are listed, but only abnormal results are displayed) Labs Reviewed  BASIC METABOLIC PANEL WITH GFR - Abnormal; Notable for the following components:      Result Value   Glucose, Bld 103 (*)    BUN 21 (*)    All other components within normal limits  CBC  MAGNESIUM   TSH  POC URINE PREG, ED  TROPONIN I (HIGH SENSITIVITY)     EKG Interpreted by me Sinus tachycardia rate 101.  Normal axis intervals QRS ST segments T waves   RADIOLOGY Chest x-ray interpreted by me, appears normal.  Radiology report reviewed   PROCEDURES:  Procedures   MEDICATIONS ORDERED IN ED: Medications - No data to display   IMPRESSION / MDM / ASSESSMENT AND PLAN / ED COURSE  I  reviewed the triage vital signs and the nursing notes.  DDx: Paroxysmal arrhythmia, electrolyte derangement, hyperthyroidism, carditis, anxiety  Patient's presentation is most consistent with acute presentation with potential threat to life or bodily function.  Patient presents with palpitations without significant associated symptoms.  Currently in sinus rhythm with mild tachycardia and otherwise normal vital signs.  She is nontoxic with reassuring exam.  Will check labs.   ----------------------------------------- 11:34 AM on 02/20/2024 ----------------------------------------- Labs normal.  Stable for discharge.      FINAL CLINICAL IMPRESSION(S) / ED DIAGNOSES   Final diagnoses:  Palpitations     Rx / DC Orders   ED Discharge Orders          Ordered    Ambulatory referral to Cardiology       Comments: If you have not heard from the Cardiology office within the next 72 hours please call [phone].   02/20/24 1134             Note:  This document was prepared using Dragon voice recognition software and may include unintentional dictation errors.   Viviann Pastor, MD 02/20/24 1134

## 2024-02-22 ENCOUNTER — Emergency Department: Payer: Self-pay

## 2024-02-22 ENCOUNTER — Other Ambulatory Visit: Payer: Self-pay

## 2024-02-22 ENCOUNTER — Encounter: Payer: Self-pay | Admitting: Emergency Medicine

## 2024-02-22 DIAGNOSIS — R002 Palpitations: Secondary | ICD-10-CM | POA: Insufficient documentation

## 2024-02-22 NOTE — ED Triage Notes (Signed)
 Patient ambulatory to triage with steady gait, without difficulty or distress noted; pt reports being seen on Tuesday with normal test findings; c/o persistent sensation of heart pounding and generalized HA, diff sleeping

## 2024-02-23 ENCOUNTER — Emergency Department
Admission: EM | Admit: 2024-02-23 | Discharge: 2024-02-23 | Disposition: A | Discharge status: Home / Self Care | Payer: Self-pay | Attending: Emergency Medicine | Admitting: Emergency Medicine

## 2024-02-23 DIAGNOSIS — R002 Palpitations: Secondary | ICD-10-CM

## 2024-02-23 LAB — COMPREHENSIVE METABOLIC PANEL WITH GFR
ALT: 15 U/L (ref 0–44)
AST: 19 U/L (ref 15–41)
Albumin: 4 g/dL (ref 3.5–5.0)
Alkaline Phosphatase: 45 U/L (ref 38–126)
Anion gap: 11 (ref 5–15)
BUN: 25 mg/dL — ABNORMAL HIGH (ref 6–20)
CO2: 22 mmol/L (ref 22–32)
Calcium: 9.2 mg/dL (ref 8.9–10.3)
Chloride: 107 mmol/L (ref 98–111)
Creatinine, Ser: 1.07 mg/dL — ABNORMAL HIGH (ref 0.44–1.00)
GFR, Estimated: >60 mL/min (ref >60)
Glucose, Bld: 120 mg/dL — ABNORMAL HIGH (ref 70–99)
Potassium: 3.6 mmol/L (ref 3.5–5.1)
Sodium: 140 mmol/L (ref 135–145)
Total Bilirubin: 0.6 mg/dL (ref 0.0–1.2)
Total Protein: 7.2 g/dL (ref 6.5–8.1)

## 2024-02-23 LAB — CBC
HCT: 41.7 % (ref 36.0–46.0)
Hemoglobin: 14.1 g/dL (ref 12.0–15.0)
MCH: 31.1 pg (ref 26.0–34.0)
MCHC: 33.8 g/dL (ref 30.0–36.0)
MCV: 91.9 fL (ref 80.0–100.0)
Platelets: 287 K/uL (ref 150–400)
RBC: 4.54 MIL/uL (ref 3.87–5.11)
RDW: 13 % (ref 11.5–15.5)
WBC: 7.6 K/uL (ref 4.0–10.5)
nRBC: 0 % (ref 0.0–0.2)

## 2024-02-23 LAB — TROPONIN I (HIGH SENSITIVITY): Troponin I (High Sensitivity): 3 ng/L (ref <18)

## 2024-02-23 MED ORDER — HYDROXYZINE HCL 25 MG PO TABS: 25.0000 mg | ORAL_TABLET | Freq: Three times a day (TID) | ORAL | 0 refills | Status: AC | PRN

## 2024-02-23 NOTE — ED Notes (Signed)
 Pt discharged to home.  Instructions and medications reviewed.  Pt verbalized understanding, no questions at this time.

## 2024-02-23 NOTE — Discharge Instructions (Addendum)
 You can try taking the hydroxyzine  and see if it helps with her anxiety.  You should follow-up with a cardiologist for the palpitations if they continue.  We have placed a referral order.  The cardiology office will contact you for follow-up.  In the meantime, return to the ER for new, worsening, or persistent severe palpitations, dizziness or lightheadedness, shortness of breath, chest pain, severe anxiety, or any other new or worsening symptoms that concern you.

## 2024-02-23 NOTE — ED Provider Notes (Signed)
 [facility] Provider Note    Event Date/Time   First MD Initiated Contact with Patient 02/23/24 0139     (approximate)   History   Chest Pain   HPI  Carrie [name] is a 28 y.o. female with a history of anxiety who presents with palpitations over approximate the last week, intermittent course, and not associated with any chest pain, difficulty breathing, or any dizziness or lightheadedness.  The patient states that this palpitations are not exertional.  She mainly notices them when she is alone.  She does not have them if she is busy or distracted.  She does endorse anxiety although does not have any specific new or worsening stressors.  I reviewed the past medical records.  The patient has had multiple ED visits over the last several months with similar symptoms.  She was seen in the ED on 9/23 with palpitations and a negative workup, previously seen at the [facility] ED on 8/21 with weakness, headache, and dehydration, and seen by me here in the ED twice on 8/16 and 8/14 with multiple complaints.   Physical Exam   Triage Vital Signs: ED Triage Vitals  Encounter Vitals Group     BP 02/22/24 2351 139/84     Girls Systolic BP Percentile --      Girls Diastolic BP Percentile --      Boys Systolic BP Percentile --      Boys Diastolic BP Percentile --      Pulse Rate 02/22/24 2351 97     Resp 02/22/24 2351 18     Temp 02/22/24 2351 99.9 F (37.7 C)     Temp Source 02/22/24 2351 Oral     SpO2 02/22/24 2351 100 %     Weight 02/22/24 2352 140 lb (63.5 kg)     Height 02/22/24 2352 5' 2 (1.575 m)     Head Circumference --      Peak Flow --      Pain Score 02/22/24 2351 0     Pain Loc --      Pain Education --      Exclude from Growth Chart --     Most recent vital signs: Vitals:   02/22/24 2351 02/23/24 0205  BP: 139/84 120/69  Pulse: 97 75  Resp: 18 18  Temp: 99.9 F (37.7 C) 98.6 F (37 C)  SpO2: 100% 100%     General: Alert, well-appearing, no  distress.  CV:  Good peripheral perfusion.  Resp:  Normal effort.  Abd:  No distention.  Other:  No peripheral edema.   ED Results / Procedures / Treatments   Labs (all labs ordered are listed, but only abnormal results are displayed) Labs Reviewed  COMPREHENSIVE METABOLIC PANEL WITH GFR - Abnormal; Notable for the following components:      Result Value   Glucose, Bld 120 (*)    BUN 25 (*)    Creatinine, Ser 1.07 (*)    All other components within normal limits  CBC  TROPONIN I (HIGH SENSITIVITY)     EKG  ED ECG REPORT I, Waylon Cassis, the attending physician, personally viewed and interpreted this ECG.  Date: 02/23/2024 EKG Time: 2332 Rate: 92 Rhythm: normal sinus rhythm QRS Axis: normal Intervals: normal ST/T Wave abnormalities: Nonspecific T wave abnormalities Narrative Interpretation: no evidence of acute ischemia; no significant change when compared to EKG of 02/20/2024    RADIOLOGY  Chest x-ray: I independently viewed interpreted images; there is no focal  consolidation or edema   PROCEDURES:  Critical Care performed: No  Procedures   MEDICATIONS ORDERED IN ED: Medications - No data to display   IMPRESSION / MDM / ASSESSMENT AND PLAN / ED COURSE  I reviewed the triage vital signs and the nursing notes.  28 year old female with PMH as noted above presents with intermittent palpitations over the last week with no concerning associated symptoms.  On exam the patient is well-appearing.  Her vital signs are normal.  Physical exam is unremarkable.  Differential diagnosis includes, but is not limited to, anxiety, sinus tachycardia, PVCs, less likely arrhythmia.  Patient's presentation is most consistent with acute complicated illness / injury requiring diagnostic workup.  CMP and CBC show no acute findings.  Troponin is negative.  EKG is nonischemic and does not show any concerning rhythm.  The patient is asymptomatic currently.  Overall most  likely etiology is anxiety.  I have prescribed hydroxyzine  for the patient to try.  However, if her symptoms persist I think will be beneficial for her to follow-up with cardiology.  I ordered a cardiology referral.  At this time, the patient is stable for discharge.  She feels comfortable going home.  I gave strict return precautions and she expressed understanding.   FINAL CLINICAL IMPRESSION(S) / ED DIAGNOSES   Final diagnoses:  Palpitations     Rx / DC Orders   ED Discharge Orders          Ordered    hydrOXYzine  (ATARAX ) 25 MG tablet  3 times daily PRN        02/23/24 0204    Ambulatory referral to Cardiology       Comments: If you have not heard from the Cardiology office within the next 72 hours please call [phone].   02/23/24 0204             Note:  This document was prepared using Dragon voice recognition software and may include unintentional dictation errors.    Jacolyn Pae, MD 02/23/24 [phone]

## 2024-02-27 ENCOUNTER — Ambulatory Visit: Payer: Self-pay | Admitting: Obstetrics & Gynecology

## 2024-02-27 ENCOUNTER — Other Ambulatory Visit (HOSPITAL_COMMUNITY)
Admission: RE | Admit: 2024-02-27 | Discharge: 2024-02-27 | Disposition: A | Discharge status: Home / Self Care | Payer: Self-pay | Source: Ambulatory Visit | Attending: Obstetrics & Gynecology | Admitting: Obstetrics & Gynecology

## 2024-02-27 VITALS — BP 121/82 | HR 92 | Wt 142.7 lb

## 2024-02-27 DIAGNOSIS — N926 Irregular menstruation, unspecified: Secondary | ICD-10-CM

## 2024-02-27 DIAGNOSIS — N939 Abnormal uterine and vaginal bleeding, unspecified: Secondary | ICD-10-CM

## 2024-02-27 DIAGNOSIS — Z124 Encounter for screening for malignant neoplasm of cervix: Secondary | ICD-10-CM | POA: Insufficient documentation

## 2024-02-27 MED ORDER — NORGESTREL-ETHINYL ESTRADIOL 0.3-30 MG-MCG PO TABS: 1.0000 | ORAL_TABLET | Freq: Every day | ORAL | 5 refills | Status: AC

## 2024-02-27 NOTE — Progress Notes (Signed)
    GYNECOLOGY PROGRESS NOTE  Subjective:    Patient ID: Carrie [name], female    DOB: 10-25-1995, 28 y.o.   MRN: [id number]  HPI  Patient is a 28 y.o. single [id number] (2 and 32 yo kids) here today to discuss her periods. She has been using depo provera  for about 3 years, gets it q 12 weeks. She reports that in general she will have irregular bleeding/spotting, about 15 days per month. For the last 3 months she has had daily bleeding.  She had Mirena and Nexplanon . She had irregular bleeding with these. She used OCPs in the past. She would like to start OCPs again. She was at the ED for heart palpitations and her hbg was 14 and her TSH was normal.  The following portions of the patient's history were reviewed and updated as appropriate: allergies, current medications, past family history, past medical history, past social history, past surgical history, and problem list.  Review of Systems Pertinent items are noted in HPI.   Objective:   Blood pressure 121/82, pulse 92, weight 142 lb 11.2 oz (64.7 kg). Body mass index is 26.1 kg/m. Well nourished, well hydrated Black female, no apparent distress She is ambulating and conversing normally. Abd- benign EG- spots of hypopigmentation (She reports that this has been present for her whole life) Cervix- nulliparous, old brown blood noted normal size and shape, anteverted uterus, normal adnexal exam   Assessment:   1. Cervical cancer screening     2.      Irregular bleeding (on depo)  Plan:   1. Cervical cancer screening (Primary)  - Cytology - PAP   2. If CT/GC are negative, then I will blame depo provera  for her irregular bleeding.  I have prescribed lo ovral to be started today.

## 2024-02-28 ENCOUNTER — Ambulatory Visit: Payer: Self-pay | Attending: Cardiology | Admitting: Cardiology

## 2024-02-28 ENCOUNTER — Ambulatory Visit: Payer: Self-pay

## 2024-02-28 ENCOUNTER — Encounter: Payer: Self-pay | Admitting: Cardiology

## 2024-02-28 VITALS — BP 110/80 | HR 83 | Ht 62.0 in | Wt 140.0 lb

## 2024-02-28 DIAGNOSIS — R002 Palpitations: Secondary | ICD-10-CM

## 2024-02-28 DIAGNOSIS — Z8679 Personal history of other diseases of the circulatory system: Secondary | ICD-10-CM

## 2024-02-28 DIAGNOSIS — F419 Anxiety disorder, unspecified: Secondary | ICD-10-CM

## 2024-02-28 LAB — CYTOLOGY - PAP
Chlamydia: NEGATIVE
Comment: NEGATIVE
Comment: NORMAL
Diagnosis: NEGATIVE
Neisseria Gonorrhea: NEGATIVE

## 2024-02-28 LAB — CERVICOVAGINAL ANCILLARY ONLY
Bacterial Vaginitis (gardnerella): NEGATIVE
Candida Glabrata: NEGATIVE
Candida Vaginitis: NEGATIVE
Comment: NEGATIVE
Comment: NEGATIVE
Comment: NEGATIVE
Comment: NEGATIVE
Trichomonas: NEGATIVE

## 2024-02-28 NOTE — Progress Notes (Signed)
 Cardiology Office Note   Date:  02/28/2024  ID:  Carrie [name], DOB [birth year], MRN [id number] PCP: [facility] Providers Cardiologist:  None Cardiology APP:  Gerard Frederick, NP     History of Present Illness Carrie [name] is a 28 y.o. female with a past medical history of anxiety, prediabetes, history of heart murmur as a baby, who is here today just establish care after recent visit to the emergency department with complaints of palpitations.  Patient was recently seen in the [facility] emergency department with complaints of palpitations over approximately 1 week, intermittent course, not associated with any other symptoms of chest pain, difficulty breathing, or dizziness or lightheadedness.  She states that the palpitations were not exertional.  She mainly noticed them when she was alone.  She does not have them when she is busy or distracted.  She endorsed anxiety although does not have any specific new or worsening stressors.  Per chart review she had had multiple emergency department visits over several months with similar symptoms.  She was recently seen the emergency department on 9/23 with palpitations and a negative workup previously seen in the [facility] emergency room on 8/20 with weakness, headache, dehydration, and seen in the [facility] emergency department twice on 8/16 and 8/14 with multiple complaints.  On arrival initial vital signs revealed a blood pressure of 139/84, pulse of 97, respirations of 18, temperature of 99.9.  Labs reveal blood glucose of 120, BUN 25, serum creatinine 1.07.  EKG revealed sinus rhythm with a rate of 92 with nonspecific T wave abnormalities with no evidence of acute ischemia.  Chest x-ray revealed no focal consolidation or ischemia.  Troponin was negative.  CMP and CBC showed no acute findings.  Overall most likely etiology was anxiety.  She was prescribed Atarax  25 mg 3 times daily as needed with an ambulatory referral to  cardiology.  She presents today to establish care and continue workup for complaint of palpitations.  She states that the palpitations are worse in the evening when she is in the low when she is without distraction.  She has cut out caffeine  and primarily all sugar from her diet with the recent diagnoses of prediabetes.  She does have a longstanding history of anxiety and has not previously taken any antianxiety medication and she is nervous related to side effects.  She also stated she previously had had a heart murmur and then spontaneously went away when she was around 28 years old.  She does have some ongoing left hand paresthesia that she had previously been evaluated in the emergency department for.  She denies any current chest pain, shortness of breath, peripheral edema, or claudication symptoms  ROS: 10 point review of systems has been reviewed and considered negative with exception was been listed in the HPI  Studies Reviewed EKG Interpretation Date/Time:  Wednesday February 28 2024 10:36:19 EDT Ventricular Rate:  83 PR Interval:  150 QRS Duration:  80 QT Interval:  356 QTC Calculation: 418 R Axis:   25  Text Interpretation: Normal sinus rhythm Nonspecific T wave abnormality When compared with ECG of 22-Feb-2024 23:32, No significant change since last tracing Confirmed by Gerard Frederick ([id number]) on 02/28/2024 10:37:33 AM   Risk Assessment/Calculations         Physical Exam VS:  BP 110/80 (BP Location: Right Arm, Cuff Size: Normal)   Pulse 83   Ht 5' 2 (1.575 m)   Wt 140 lb (63.5 kg)   SpO2 99%  BMI 25.61 kg/m        Wt Readings from Last 3 Encounters:  02/28/24 140 lb (63.5 kg)  02/27/24 142 lb 11.2 oz (64.7 kg)  02/22/24 140 lb (63.5 kg)    GEN: Well nourished, well developed in no acute distress NECK: No JVD; No carotid bruits CARDIAC: RRR, no murmurs, rubs, gallops RESPIRATORY:  Clear to auscultation without rales, wheezing or rhonchi  ABDOMEN: Soft, non-tender,  non-distended EXTREMITIES:  No edema; No deformity   ASSESSMENT AND PLAN Palpitations that have been progressive and ongoing for over a week.  Recent emergency department visit normal lab results and nonacute EKG.  EKG today reveals sinus rhythm rate of 83 with nonspecific T wave abnormality but no ischemic changes.  Symptoms are more pronounced in the evening and when she is alone.  Likely related to her untreated anxiety.  She has no caffeine  currently in her diet to reduce.  She has been encouraged to try the hydroxyzine  that she was given in the emergency department for intense episodes of palpitations.  She is being placed on a ZIO XT monitor to rule out arrhythmia for 2 weeks.  History of heart murmur that she states spontaneously went away when she was 27 years old.  She has been scheduled for an echocardiogram to rule out any structural or functional abnormalities.  Anxiety that has been longstanding that she does have medication for but she is anxious about taking the medication due to side effects.  Ongoing management per PCP.  She was recently given hydroxyzine  in the emergency department to take on as-needed basis and was encouraged to trial the medication.       Dispo: Patient to return to clinic to see MD/APP in 6 to 8 weeks or sooner if needed after testing is completed for further evaluation  Signed, Garrell Flagg, NP

## 2024-02-28 NOTE — Patient Instructions (Signed)
 Medication Instructions:  Your physician recommends that you continue on your current medications as directed. Please refer to the Current Medication list given to you today.   *If you need a refill on your cardiac medications before your next appointment, please call your pharmacy*  Lab Work: No labs ordered today  If you have labs (blood work) drawn today and your tests are completely normal, you will receive your results only by: MyChart Message (if you have MyChart) OR A paper copy in the mail If you have any lab test that is abnormal or we need to change your treatment, we will call you to review the results.  Testing/Procedures: Your physician has requested that you have an echocardiogram. Echocardiography is a painless test that uses sound waves to create images of your heart. It provides your doctor with information about the size and shape of your heart and how well your heart's chambers and valves are working.   You may receive an ultrasound enhancing agent through an IV if needed to better visualize your heart during the echo. This procedure takes approximately one hour.  There are no restrictions for this procedure.  This will take place at [address] [facility]) [address], [place] [address]  Please note: We ask at that you not bring children with you during ultrasound (echo/ vascular) testing. Due to room size and safety concerns, children are not allowed in the ultrasound rooms during exams. Our front office staff cannot provide observation of children in our lobby area while testing is being conducted. An adult accompanying a patient to their appointment will only be allowed in the ultrasound room at the discretion of the ultrasound technician under special circumstances. We apologize for any inconvenience.   ZIO XT- Long Term Monitor Instructions  Your physician has requested you wear a ZIO patch monitor for 14 days.  This is a single patch monitor. [employer]  supplies one patch monitor per enrollment. Additional stickers are not available. Please do not apply patch if you will be having a Nuclear Stress Test,  Echocardiogram, Cardiac CT, MRI, or Chest Xray during the period you would be wearing the  monitor. The patch cannot be worn during these tests. You cannot remove and re-apply the  ZIO XT patch monitor.  Your ZIO patch monitor will be mailed 3 day USPS to your address on file. It may take 3-5 days  to receive your monitor after you have been enrolled.  Once you have received your monitor, please review the enclosed instructions. Your monitor  has already been registered assigning a specific monitor serial # to you.  Billing and Patient Assistance Program Information  We have supplied [employer] with any of your insurance information on file for billing purposes. [employer] offers a sliding scale Patient Assistance Program for patients that do not have  insurance, or whose insurance does not completely cover the cost of the ZIO monitor.  You must apply for the Patient Assistance Program to qualify for this discounted rate.  To apply, please call [employer] at [phone], select option 4, select option 2, ask to apply for  Patient Assistance Program. Meredeth will ask your household income, and how many people  are in your household. They will quote your out-of-pocket cost based on that information.  [employer] will also be able to set up a 6-month, interest-free payment plan if needed.  Applying the monitor   Shave hair from upper left chest.  Hold abrader disc by orange tab. Rub abrader in  40 strokes over the upper left chest as  indicated in your monitor instructions.  Clean area with 4 enclosed alcohol pads. Let dry.  Apply patch as indicated in monitor instructions. Patch will be placed under collarbone on left  side of chest with arrow pointing upward.  Rub patch adhesive wings for 2 minutes. Remove white label marked 1. Remove the white   label marked 2. Rub patch adhesive wings for 2 additional minutes.  While looking in a mirror, press and release button in center of patch. A small green light will  flash 3-4 times. This will be your only indicator that the monitor has been turned on.  Do not shower for the first 24 hours. You may shower after the first 24 hours.  Press the button if you feel a symptom. You will hear a small click. Record Date, Time and  Symptom in the Patient Logbook.  When you are ready to remove the patch, follow instructions on the last 2 pages of Patient  Logbook. Stick patch monitor onto the last page of Patient Logbook.  Place Patient Logbook in the blue and white box. Use locking tab on box and tape box closed  securely. The blue and white box has prepaid postage on it. Please place it in the mailbox as  soon as possible. Your physician should have your test results approximately 7 days after the  monitor has been mailed back to [facility].  Call [facility] Customer Care at [phone] if you have questions regarding  your ZIO XT patch monitor. Call them immediately if you see an orange light blinking on your  monitor.  If your monitor falls off in less than 4 days, contact our Monitor department at [phone].  If your monitor becomes loose or falls off after 4 days call [employer] at [phone] for  suggestions on securing your monitor   Follow-Up: At [facility], you and your health needs are our priority.  As part of our continuing mission to provide you with exceptional heart care, our providers are all part of one team.  This team includes your primary Cardiologist (physician) and Advanced Practice Providers or APPs (Physician Assistants and Nurse Practitioners) who all work together to provide you with the care you need, when you need it.  Your next appointment:   2 month(s)  Provider:   You may see one of the following Advanced Practice Providers on your  designated Care Team:   Lonni Meager, NP Lesley Maffucci, PA-C Bernardino Bring, PA-C Cadence Cut Off, PA-C Tylene Lunch, NP Barnie Hila, NP

## 2024-03-27 ENCOUNTER — Ambulatory Visit: Payer: Self-pay | Admitting: Cardiology

## 2024-03-27 DIAGNOSIS — R002 Palpitations: Secondary | ICD-10-CM

## 2024-03-27 NOTE — Progress Notes (Signed)
 Average heart rate of 94 bpm, no sustained arrhythmia or prolonged pauses, no atrial fibrillation or atrial flutter, rare PACs or PVCs were observed.  Overall pattern on monitor.  No findings to suggest symptoms.  Overall reassuring study.

## 2024-04-01 NOTE — Progress Notes (Deleted)
    NURSE VISIT NOTE  Subjective:    Patient ID: Carrie [name], female    DOB: Sep 22, 1995, 28 y.o.   MRN: [id number]  HPI  Patient is a 28 y.o. G87P2002 female who presents for depo provera  injection.   Objective:    There were no vitals taken for this visit.  Last Annual: 01/09/2023. Last pap: 09/11/2021. Last Depo-Provera : 01/09/2024. Side Effects if any: none. Serum HCG indicated? Yes . Depo-Provera  150 mg IM given by: Camelia Fetters, CMA. Site: Right Deltoid  Lab Review  No results found for any visits on 04/02/24.  Assessment:   1. Surveillance for Depo-Provera  contraception      Plan:   Next appointment due between Jan. 20 and Feb. 3    Camelia Fetters, CMA [facility]

## 2024-04-01 NOTE — Patient Instructions (Incomplete)

## 2024-04-02 ENCOUNTER — Ambulatory Visit: Payer: Self-pay

## 2024-04-02 DIAGNOSIS — Z3042 Encounter for surveillance of injectable contraceptive: Secondary | ICD-10-CM

## 2024-04-18 ENCOUNTER — Ambulatory Visit: Payer: Self-pay | Attending: Cardiology

## 2024-05-02 ENCOUNTER — Ambulatory Visit: Payer: Self-pay | Admitting: Cardiology

## 2024-05-02 NOTE — Progress Notes (Deleted)
 Cardiology Office Note   Date:  05/02/2024  ID:  LAUREY SALSER, DOB [birth year], MRN [id number] PCP: [facility] Providers Cardiologist:  None Cardiology APP:  Gerard Frederick, NP { Click to update primary MD,subspecialty MD or APP then REFRESH:1}    History of Present Illness Carrie [name] is a 28 y.o. female with past medical history of anxiety, prediabetes, history of heart murmur as a baby, and palpitations, who is here today for follow-up.   Patient was recently seen in the [facility] emergency department with complaints of palpitations over approximately 1 week, intermittent course, not associated with any other symptoms of chest pain, difficulty breathing, or dizziness or lightheadedness. She states that the palpitations were not exertional. She mainly noticed them when she was alone. She does not have them when she is busy or distracted. She endorsed anxiety although does not have any specific new or worsening stressors. Per chart review she had had multiple emergency department visits over several months with similar symptoms. She was recently seen the emergency department on 9/23 with palpitations and a negative workup previously seen in the [facility] emergency room on 8/20 with weakness, headache, dehydration, and seen in the [facility] emergency department twice on 8/16 and 8/14 with multiple complaints. On arrival initial vital signs revealed a blood pressure of 139/84, pulse of 97, respirations of 18, temperature of 99.9. Labs reveal blood glucose of 120, BUN 25, serum creatinine 1.07. EKG revealed sinus rhythm with a rate of 92 with nonspecific T wave abnormalities with no evidence of acute ischemia. Chest x-ray revealed no focal consolidation or ischemia. Troponin was negative. CMP and CBC showed no acute findings. Overall most likely etiology was anxiety. She was prescribed Atarax  25 mg 3 times daily as needed with an ambulatory referral to cardiology.   She was last  seen in clinic 02/28/2024 to establish care and continued workup for complaint of palpitations.  She was scheduled for ZIO XT monitor for 2 weeks to rule out arrhythmia.  She was also scheduled for an echocardiogram to rule out any structural or functional abnormalities from a heart murmur.    She returns to clinic today  ROS: 10 point review of system has been reviewed and considered negative the exception was been listed in the HPI  Studies Reviewed     Event Monitor (Zio) 03/25/2024   The patient was monitored for 14 days.   The predominant rhythm was sinus with an average rate of 94 bpm (range 53-161 bpm).   Rare PACs and PVCs were observed.   There was no sustained arrhythmia or prolonged pause.   Patient triggered events correspond to sinus rhythm.   Predominantly sinus rhythm with rare PACs and PVCs.  No significant arrhythmia observed.  Risk Assessment/Calculations   No BP recorded.  {Refresh Note OR Click here to enter BP  :1}***       Physical Exam VS:  There were no vitals taken for this visit.       Wt Readings from Last 3 Encounters:  02/28/24 140 lb (63.5 kg)  02/27/24 142 lb 11.2 oz (64.7 kg)  02/22/24 140 lb (63.5 kg)    GEN: Well nourished, well developed in no acute distress NECK: No JVD; No carotid bruits CARDIAC: ***RRR, no murmurs, rubs, gallops RESPIRATORY:  Clear to auscultation without rales, wheezing or rhonchi  ABDOMEN: Soft, non-tender, non-distended EXTREMITIES:  No edema; No deformity   ASSESSMENT AND PLAN Palpitations History of heart murmur Anxiety    {  Are you ordering a CV Procedure (e.g. stress test, cath, DCCV, TEE, etc)?   Press F2        :[id number]}  Dispo: ***  Signed, Moishe Schellenberg, NP

## 2024-07-03 ENCOUNTER — Emergency Department: Admission: EM | Admit: 2024-07-03 | Discharge: 2024-07-03 | Disposition: A | Discharge status: Home / Self Care

## 2024-07-03 ENCOUNTER — Emergency Department

## 2024-07-03 ENCOUNTER — Other Ambulatory Visit: Payer: Self-pay

## 2024-07-03 DIAGNOSIS — R0789 Other chest pain: Secondary | ICD-10-CM | POA: Insufficient documentation

## 2024-07-03 DIAGNOSIS — R519 Headache, unspecified: Secondary | ICD-10-CM | POA: Insufficient documentation

## 2024-07-03 DIAGNOSIS — R110 Nausea: Secondary | ICD-10-CM | POA: Insufficient documentation

## 2024-07-03 LAB — URINALYSIS, ROUTINE W REFLEX MICROSCOPIC
Bacteria, UA: NONE SEEN
Bilirubin Urine: NEGATIVE
Glucose, UA: NEGATIVE mg/dL
Ketones, ur: NEGATIVE mg/dL
Leukocytes,Ua: NEGATIVE
Nitrite: NEGATIVE
Protein, ur: NEGATIVE mg/dL
Specific Gravity, Urine: 1.023 (ref 1.005–1.030)
pH: 7 (ref 5.0–8.0)

## 2024-07-03 LAB — POC URINE PREG, ED: Preg Test, Ur: NEGATIVE

## 2024-07-03 MED ORDER — ONDANSETRON 4 MG PO TBDP: 4.0000 mg | ORAL_TABLET | Freq: Three times a day (TID) | ORAL | 0 refills | Status: AC | PRN

## 2024-07-03 MED ORDER — ACETAMINOPHEN 500 MG PO TABS: 1000.0000 mg | ORAL_TABLET | Freq: Four times a day (QID) | ORAL | 2 refills | Status: AC | PRN

## 2024-07-03 MED ORDER — IBUPROFEN 600 MG PO TABS
600.0000 mg | ORAL_TABLET | Freq: Once | ORAL | Status: AC
  Administered 2024-07-03: 600 mg via ORAL
  Filled 2024-07-03: qty 1

## 2024-07-03 MED ORDER — ACETAMINOPHEN 500 MG PO TABS
1000.0000 mg | ORAL_TABLET | Freq: Once | ORAL | Status: AC
  Administered 2024-07-03: 1000 mg via ORAL
  Filled 2024-07-03: qty 2

## 2024-07-03 MED ORDER — IBUPROFEN 200 MG PO TABS: 600.0000 mg | ORAL_TABLET | Freq: Three times a day (TID) | ORAL | 2 refills | Status: AC | PRN

## 2024-07-03 MED ORDER — ONDANSETRON 4 MG PO TBDP
4.0000 mg | ORAL_TABLET | Freq: Once | ORAL | Status: AC
  Administered 2024-07-03: 4 mg via ORAL
  Filled 2024-07-03: qty 1

## 2024-07-03 NOTE — ED Provider Notes (Addendum)
 "  Carrie [name]    Event Date/Time   First MD Initiated Contact with Patient 07/03/24 1355     (approximate)   History   Headache  C/O tinging in chest and headache x 1 week.  AAOx3. Skin warm and dry. NAD. No SOB/ DOE    HPI [employer] is a 29 y.o. female PMH prediabetes, anxiety, headaches, daily marijuana use presents for evaluation of headache, vague chest discomfort/paresthesia - Patient does endorse a right-sided headache today, parietal, 5/10 in intensity, similar to prior headaches.  Not rapid onset.  No vision changes, focal weakness.  Does chronically have nausea and vomiting which has been present for several months, usually in the mornings which did occur today as well although denies any abdominal pain and no ongoing nausea. - Does [name] that she has had ongoing anxiety and feels she had a panic attack yesterday evening when she had paresthesias in her bilateral costal regions.  Did recently have a workup by cardiology for palpitations including a 2-week Zio patch which was reportedly unremarkable. - No recent surgery/stasis/travel, no history of DVT/PE, no leg swelling, no pleuritic discomfort, no ongoing cardiopulmonary symptoms - Says she has been told multiple times that marijuana is likely contributing to her constellation of symptoms and does express a desire to stop using. - Also endorses some recent lower back discomfort.  No midline back pain, no lower extremity weakness, no saddle anesthesia, no urinary or fecal incontinence or retention.  - Has not taken any medications for his symptoms today  Per chart review, last head imaging in 2016, no structural abnormalities or other pathology.  Outpatient records also reviewed including cardiology visit on 02/28/2024 and Holter monitor results review on 03/27/2024.  Does already have hydroxyzine  for anxiety.      Physical Exam   Triage Vital Signs: ED Triage Vitals  Encounter  Vitals Group     BP 07/03/24 1132 (!) 157/88     Girls Systolic BP Percentile --      Girls Diastolic BP Percentile --      Boys Systolic BP Percentile --      Boys Diastolic BP Percentile --      Pulse Rate 07/03/24 1132 89     Resp 07/03/24 1132 16     Temp 07/03/24 1132 97.8 F (36.6 C)     Temp Source 07/03/24 1132 Oral     SpO2 07/03/24 1132 100 %     Weight 07/03/24 1133 139 lb 15.9 oz (63.5 kg)     Height --      Head Circumference --      Peak Flow --      Pain Score 07/03/24 1133 6     Pain Loc --      Pain Education --      Exclude from Growth Chart --     Most recent vital signs: Vitals:   07/03/24 1132 07/03/24 1455  BP: (!) 157/88 123/77  Pulse: 89 60  Resp: 16 16  Temp: 97.8 F (36.6 C)   SpO2: 100% 100%     General: Awake, no distress.  HEENT: Normocephalic, atraumatic, EOMI, PERRL, no conjunctival injection CV:  Good peripheral perfusion. RRR (HR 80s at time of eval), RP 2+ Resp:  Normal effort. CTAB Abd:  No distention. Nontender to deep palpation throughout Neuro:  Aox4, CN II-XII intact, FNF wnl, finger taps fast b/l, 5/5 strength in bilateral finger extension/grip, arm flexion/extension, EHL/FHL. BUE AG 10+  sec no drift, BLE AG 5+ sec no drift. Ambulates with steady gait. SILT. Negative Rhomberg.    ED Results / Procedures / Treatments   Labs (all labs ordered are listed, but only abnormal results are displayed) Labs Reviewed  URINALYSIS, ROUTINE W REFLEX MICROSCOPIC - Abnormal; Notable for the following components:      Result Value   Color, Urine YELLOW (*)    APPearance CLEAR (*)    Hgb urine dipstick MODERATE (*)    All other components within normal limits  POC URINE PREG, ED     EKG  See ED course below.    RADIOLOGY Radiology interpreted by myself radiology report reviewed.  No acute pathology identified.    PROCEDURES:  Critical Care performed: No  Procedures   MEDICATIONS ORDERED IN ED: Medications  ondansetron   (ZOFRAN -ODT) disintegrating tablet 4 mg (4 mg Oral Given 07/03/24 1451)  acetaminophen  (TYLENOL ) tablet 1,000 mg (1,000 mg Oral Given 07/03/24 1450)  ibuprofen  (ADVIL ) tablet 600 mg (600 mg Oral Given 07/03/24 1450)     IMPRESSION / MDM / ASSESSMENT AND PLAN / ED COURSE  I reviewed the triage vital signs and the nursing notes.                              DDX/MDM/AP: Differential diagnosis includes, but is not limited to, benign HA type including tension, migraine. No e/o glaucoma, meningitis. No red flags re: headache. Regarding brief episode of chest discomfort/paresthesias last night, agree with patient that she likely had a panic attack. Doubt arrhythmia given negative zio patch recently.  Highly doubt ACS. 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   Plan: - Urinalysis and hCG collected at triage, unremarkable - EKG - X-ray -No clear indication for labs at this time - Tylenol , Motrin , Zofran   Patient's presentation is most consistent with acute complicated illness / injury requiring diagnostic workup.  ED course below.  EKG with no clear evidence of ischemia.  Chest x-ray with no evidence of pathology including pneumothorax, pneumomediastinum.  Patient remains very well-appearing here, tolerating p.o., nonfocal neurologic exam, very benign abdominal exam with no tenderness to deep palpation throughout.  No evidence of acute pathology at this time and is amenable to discharge home with outpatient PMD follow-up and marijuana cessation.  Rx Zofran  and can use Tylenol  and Motrin  as needed for any recurrent mild headache.  Patient agrees with plan.  ED return precautions in place.  Clinical Course as of 07/03/24 1544  Wed Jul 03, 2024  1409 Urinalysis reviewed, unremarkable  hCG negative [MM]  1446 Ecg = sinus tach, rate 107, no gross ST elevation or depression, some diffuse T wave inversions noted [MM]  1450  CXR: IMPRESSION: No active cardiopulmonary disease.   [MM]    Clinical Course User Index [MM] Clarine Ozell LABOR, MD     FINAL CLINICAL IMPRESSION(S) / ED DIAGNOSES   Final diagnoses:  Nonintractable episodic headache, unspecified headache type  Atypical chest pain  Nausea     Rx / DC Orders   ED Discharge Orders          Ordered    ondansetron  (ZOFRAN -ODT) 4 MG disintegrating tablet  Every 8 hours PRN        07/03/24 1501    acetaminophen  (TYLENOL ) 500 MG tablet  Every 6 hours PRN        07/03/24 1501  ibuprofen  (MOTRIN  IB) 200 MG tablet  Every 8 hours PRN        07/03/24 1501             [name]:  This document was prepared using Dragon voice recognition software and may include unintentional dictation errors.   Clarine Ozell LABOR, MD 07/03/24 1543    Clarine Ozell LABOR, MD 07/03/24 1544  "

## 2024-07-03 NOTE — ED Triage Notes (Signed)
 C/O tinging in chest and headache x 1 week.  AAOx3. Skin warm and dry. NAD. No SOB/ DOE

## 2024-07-03 NOTE — Discharge Instructions (Signed)
 Your evaluation in the emergency department was reassuring. You can use tylenol  and motrin  as needed for any recurrent headaches, and I have also prescribed a nausea medication to use as needed. Continue to use hydroxyzine  as needed for any episodes of anxiety. As discussed, I recommend stopping marijuana use as I suspect this is contributing to your symptoms. Please follow up with your primary care provider and cardiologist for any ongoing symptoms, and return to the emergency department with any new or worsening symptoms.

## 2024-09-02 ENCOUNTER — Ambulatory Visit: Payer: Self-pay

## 2024-09-12 ENCOUNTER — Ambulatory Visit: Payer: Self-pay | Admitting: Cardiology
